# Patient Record
Sex: Male | Born: 1955 | Race: White | Hispanic: No | Marital: Married | Smoking: Never smoker
Health system: Southern US, Community
[De-identification: ages and names within clinical notes are randomized; demographics above are authoritative.]

## PROBLEM LIST (undated history)

## (undated) DIAGNOSIS — S14105A Unspecified injury at C5 level of cervical spinal cord, initial encounter: Secondary | ICD-10-CM

## (undated) DIAGNOSIS — I10 Essential (primary) hypertension: Secondary | ICD-10-CM

## (undated) DIAGNOSIS — G8250 Quadriplegia, unspecified: Secondary | ICD-10-CM

## (undated) DIAGNOSIS — G904 Autonomic dysreflexia: Secondary | ICD-10-CM

## (undated) HISTORY — PX: SPINE SURGERY: SHX786

## (undated) HISTORY — PX: COLONOSCOPY: SHX174

## (undated) HISTORY — DX: Unspecified injury at C5 level of cervical spinal cord, initial encounter: S14.105A

## (undated) HISTORY — DX: Autonomic dysreflexia: G90.4

## (undated) HISTORY — DX: Essential (primary) hypertension: I10

## (undated) HISTORY — PX: ANKLE SURGERY: SHX546

## (undated) HISTORY — PX: BACK SURGERY: SHX140

## (undated) HISTORY — DX: Quadriplegia, unspecified: G82.50

---

## 1979-04-03 DIAGNOSIS — IMO0002 Reserved for concepts with insufficient information to code with codable children: Secondary | ICD-10-CM

## 1979-04-03 HISTORY — DX: Reserved for concepts with insufficient information to code with codable children: IMO0002

## 1999-04-27 ENCOUNTER — Ambulatory Visit (HOSPITAL_COMMUNITY): Admission: RE | Admit: 1999-04-27 | Discharge: 1999-04-27 | Payer: Self-pay | Admitting: Neurological Surgery

## 1999-04-27 ENCOUNTER — Encounter: Payer: Self-pay | Admitting: Neurological Surgery

## 1999-10-19 ENCOUNTER — Encounter: Payer: Self-pay | Admitting: Neurological Surgery

## 1999-10-23 ENCOUNTER — Inpatient Hospital Stay (HOSPITAL_COMMUNITY): Admission: RE | Admit: 1999-10-23 | Discharge: 1999-10-27 | Payer: Self-pay | Admitting: Neurological Surgery

## 1999-12-08 ENCOUNTER — Encounter: Admission: RE | Admit: 1999-12-08 | Discharge: 1999-12-08 | Payer: Self-pay | Admitting: Neurological Surgery

## 1999-12-08 ENCOUNTER — Encounter: Payer: Self-pay | Admitting: Neurological Surgery

## 2000-03-13 ENCOUNTER — Ambulatory Visit (HOSPITAL_COMMUNITY): Admission: RE | Admit: 2000-03-13 | Discharge: 2000-03-13 | Payer: Self-pay | Admitting: Neurological Surgery

## 2000-03-13 ENCOUNTER — Encounter: Payer: Self-pay | Admitting: Neurological Surgery

## 2002-07-22 ENCOUNTER — Encounter
Admission: RE | Admit: 2002-07-22 | Discharge: 2002-10-20 | Payer: Self-pay | Admitting: Physical Medicine & Rehabilitation

## 2003-01-18 ENCOUNTER — Encounter
Admission: RE | Admit: 2003-01-18 | Discharge: 2003-04-18 | Payer: Self-pay | Admitting: Physical Medicine & Rehabilitation

## 2003-05-01 ENCOUNTER — Ambulatory Visit (HOSPITAL_COMMUNITY): Admission: RE | Admit: 2003-05-01 | Discharge: 2003-05-01 | Payer: Self-pay | Admitting: Neurological Surgery

## 2004-01-26 ENCOUNTER — Ambulatory Visit: Payer: Self-pay | Admitting: Anesthesiology

## 2004-04-04 ENCOUNTER — Ambulatory Visit: Payer: Self-pay | Admitting: Rheumatology

## 2004-12-28 ENCOUNTER — Ambulatory Visit: Payer: Self-pay | Admitting: Urology

## 2005-03-08 ENCOUNTER — Ambulatory Visit: Payer: Self-pay | Admitting: Surgery

## 2006-02-27 ENCOUNTER — Emergency Department: Payer: Self-pay

## 2006-03-11 ENCOUNTER — Ambulatory Visit: Payer: Self-pay | Admitting: Urology

## 2006-03-28 ENCOUNTER — Ambulatory Visit: Payer: Self-pay | Admitting: Rheumatology

## 2006-04-02 HISTORY — PX: HEMORRHOID SURGERY: SHX153

## 2007-03-13 ENCOUNTER — Ambulatory Visit: Payer: Self-pay | Admitting: Neurological Surgery

## 2008-04-05 ENCOUNTER — Ambulatory Visit (HOSPITAL_COMMUNITY): Admission: RE | Admit: 2008-04-05 | Discharge: 2008-04-05 | Payer: Self-pay | Admitting: Neurological Surgery

## 2008-09-17 ENCOUNTER — Ambulatory Visit: Payer: Self-pay | Admitting: Urology

## 2009-08-16 ENCOUNTER — Ambulatory Visit: Payer: Self-pay | Admitting: Urology

## 2010-04-23 ENCOUNTER — Encounter: Payer: Self-pay | Admitting: Neurological Surgery

## 2010-06-02 ENCOUNTER — Ambulatory Visit: Payer: Self-pay | Admitting: Unknown Physician Specialty

## 2010-08-18 NOTE — Discharge Summary (Signed)
[place]. [facility]  Patient:    Jesse Whitney, Jesse Whitney                    MRN: [id number] Adm. Date:  [id number] Disc. Date: [id number] Attending:  Jonne Ply                           Discharge Summary  ADMISSION DIAGNOSIS:  Cervical spinal cord injury with postoperative thoracic searings and severe spasms.  DISCHARGE DIAGNOSIS:  Cervical spinal cord injury with postoperative thoracic searings and severe spasms.  OPERATION PERFORMED:  Thoracic laminectomy and laser syringoscopy DD:  10/27/99 TD:  10/30/99 Job: [id number]

## 2010-08-18 NOTE — Discharge Summary (Signed)
[place]. [facility]  Patient:    Jesse Whitney, Jesse Whitney                    MRN: [id number] Adm. Date:  [id number] Disc. Date: [id number] Attending:  Jonne Ply                           Discharge Summary  ADMISSION DIAGNOSES:  Cervical spinal cord injury with postoperative thoracic ______  and severe spasms.  DISCHARGE DIAGNOSES:  Cervical spinal cord injury with postoperative thoracic ______  and severe spasms.  OPERATION:  Thoracic laminectomy and laser syringostomy on October 23, 1999.  CONDITION ON DISCHARGE:  Improving.  HOSPITAL COURSE:  The patient is a 55 year old individual who had a spinal cord injury some 14 years ago.  The patient has been paraplegic since that time.  He has developed problems with increasing spasms in the lower extremities such that he has become immobilized despite the use of massive doses of medication.  Her was advised regarding syringostomy and was admitted to the hospital where he underwent this procedure.  Postoperatively for the first three days, the patient had significant problems with fever and neck pain.  This was well controlled with the use of anti-inflammatories and hydrocodone.  He is discharged home at this time with a prescription for hydrocodone #90.  He will be seen in the office in approximately two weeks time for further followup.  He is also on Valium at this time, but he has not had a significant problem with spasms as he had preoperatively.  His incision is clean and dry, and he has gone back to his baseline level of functioning. DD:  10/27/99 TD:  10/30/99 Job: [id number]
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
## 2010-08-18 NOTE — Op Note (Signed)
[place]. [facility]  Patient:    Jesse Whitney, Jesse Whitney                    MRN: [id number] Proc. Date: 10/23/99 Adm. Date:  [id number] Attending:  Jonne Ply                           Operative Report  PREOPERATIVE DIAGNOSIS:  Thoracic syringomyelia with severe spasticity in lower extremities, paraplegia secondary to C7 trauma fracture.  POSTOPERATIVE DIAGNOSIS: Thoracic syringomyelia with severe spasticity in lower extremities, paraplegia secondary to C7 trauma fracture.  PROCEDURE:  Thoracic laminectomy T2 to T3 with laser syringostomy and microscope with microdissection technique.  SURGEON:  Stefani Dama, M.D.  ASSISTANT:  Tanya Nones. Jeral Fruit, M.D.  ANESTHESIA:  General endotracheal.  INDICATIONS:  The patient is a 55 year old individual, who has severe spasticity in his lower extremities with the presence of a large thoracic syrinx.  PROCEDURE:  The patient was brought to the operating room supine on a stretcher.  After smooth induction of general endotracheal anesthesia, he was turned prone, back was shaved, prepped with Duraprep and draped in a sterile fashion. An elliptical incision was made around the inferior portion of his scar and this was excised in the upper thoracic region and the incision was continued downward below the edge of his scar.  Dissection was then managed in a subperiosteal fashion, exposing the spinous process using the lamina of T2 and T3.  Then using a B5 bur and the Midas Rex, a thoracic laminectomy was performed in an en bloc fashion removing the spinous process and lamina of T2 and then T3.  The underlying dura was identified, hemostasis from the soft tissues was obtained with the bipolar cautery.  As the dissection was continued, hemostasis from the soft tissues and redundant, thickened yellow ligament was obtained and these areas were resected so as to expose the area of the lamina.  The microscope was then  draped and brought into the field and then using 6-0 Prolene suture as a tenting suture, the dura was opened, underlying cord was noted to be abutted right against the inner layer of the dura.  Minimal amounts of spinal fluid were encountered in this area. Retraction sutures were then placed on either side and tied to the muscle so as to expose the cord.  Then, using a microdissection technique, the arachnoid was lifted gently in the mid portion and divided.  Again, minimal amount of spinal fluid was encountered in this area.  The laser was then brought into the field and set at repeating pulse sequence of 5 watts with 0.15 second pulse width repeating every 0.2 seconds.  Using this technique, then a laser syringostomy was performed in the mid portion of the thoracic spinal canal and a large gush of spinal fluid was obtained when the initial puncture was performed.  The laser syringostomy was opened for a distance of approximately 4 mm.  Then, blunt dissection was also performed to make sure that the opening was adequate.  The arachnoid was further dissected above this area.  In the end, the cord was well-decompressed.  The dura was then closed under the microscope using a 6-0 Prolene microtechnique.  The dural closure was noted to be intact.  Hemostasis from the surrounding soft tissues, which once decompressed had bled more profusely, was obtained with the bipolar cautery and some pledgets of Gelfoam and thrombin, which were later  removed.  The muscle layer was then closed with #1 Vicryl and #1 PDS in an interrupted fashion, 2-0 Vicryl was used in the subcutaneous and subcuticular tissues, 3-0 Vicryl was used to close the skin subcuticularly, very tightly.  The patient tolerated the procedure well and was returned to recovery room in stable condition.  Blood loss for the procedure was estimated at 300 cc. DD:  10/23/99 TD:  10/23/99 Job: [id number]

## 2011-05-30 ENCOUNTER — Ambulatory Visit: Payer: Self-pay | Admitting: Urology

## 2011-06-04 ENCOUNTER — Ambulatory Visit: Payer: Self-pay | Admitting: Urology

## 2011-10-09 ENCOUNTER — Ambulatory Visit: Payer: Self-pay | Admitting: Rheumatology

## 2013-08-17 ENCOUNTER — Ambulatory Visit: Payer: Self-pay | Admitting: Urology

## 2014-07-25 NOTE — Op Note (Signed)
PATIENT NAME:  Jesse Whitney, [name] MR#:  [id number] DATE OF BIRTH:  Apr 21, 1955  DATE OF PROCEDURE:  06/04/2011  PREOPERATIVE DIAGNOSIS: Urethral stricture disease.   POSTOPERATIVE DIAGNOSIS: Urethral stricture disease.   PROCEDURE: Cysto with visual internal urethrotomy using the Holmium laser.   SURGEON: Suszanne ConnersMichael R. Evelene CroonWolff, MD  ANESTHETIST: Dr. Noralyn Pickarroll  ANESTHESIA: General.   INDICATIONS: See the dictated history and physical. After informed consent, the patient requested the above procedure.   OPERATIVE SUMMARY: After adequate general anesthesia had been obtained, patient was placed into dorsal lithotomy position and the perineum was prepped and draped in the usual fashion. The 17 French cystoscope was coupled with the camera and then visually advanced into the urethra. The scope could be advanced only up to the bulbar urethra at which point stricture was identified. A 0.035 guidewire was then advanced through the stricture and curled into the bladder. The 550 micron Holmium laser fiber was then introduced through the scope and the  stricture incised at the 12:00 position eventually allowing entry into the bladder. Bladder was moderately trabeculated. No bladder lesions were identified. Both ureteral orifices were identified and had clear efflux. At this point the cystoscope and guidewire were removed. A 20 French silicone catheter was placed. Procedure was then terminated. A B and O suppository was placed. Patient was then transferred to the recovery room in stable condition.   ____________________________ Suszanne ConnersMichael R. Evelene CroonWolff, MD mrw:cms D: 06/04/2011 11:09:11 ET T: 06/04/2011 11:24:34 ET JOB#: [id number] cc: Suszanne ConnersMichael R. Evelene CroonWolff, MD, <Dictator> Orson ApeMICHAEL R Karista Aispuro MD ELECTRONICALLY SIGNED 06/05/2011 7:36

## 2014-07-25 NOTE — H&P (Signed)
PATIENT NAME:  Jesse Whitney, Jesse Whitney MR#:  [id number] DATE OF BIRTH:  [birth year]  DATE OF ADMISSION:  03[name]/07/2011  CHIEF COMPLAINT: Difficulty inserting catheter.   HISTORY OF PRESENT ILLNESS: Jesse Whitney is a 59 year old white male, a C6 incomplete quadriplegic, who performs intermittent self-catheterization. For the past few months, he has had difficulty performing this as the catheter meets resistance. He also has developed symptoms consistent with dysreflexia including headaches and diaphoresis. He comes in now for cysto with visual internal urethrotomy using the holmium laser. He does have a history of stricture disease and underwent urethrotomy back in 1994.   ALLERGIES: IV contrast.   CURRENT MEDICATIONS: Diazepam, fentanyl patch, omeprazole, and Toviaz.  PAST SURGICAL HISTORY:  1. C-spine laminectomy in 1981. 2. Internal urethrotomy 1988 and in 1994.   SOCIAL HISTORY: He is married. He denies tobacco use. He drinks beer socially.   FAMILY HISTORY: Noncontributory.   PAST AND CURRENT MEDICAL CONDITIONS:  1. Neurogenic bladder. 2. C6 incomplete quadriplegia. 3. Probable urethral stricture disease.  4. Chronic pain. 5. Hypogonadism.   REVIEW OF SYSTEMS: The patient denied chest pain, shortness of breath, diabetes, stroke, or heart disease.     PHYSICAL EXAMINATION:   GENERAL: Well-nourished white male in no distress.   HEENT: Pupils were clear. Extraocular movements were intact.   NECK: Supple. No palpable cervical adenopathy.   LUNGS: Clear to auscultation.   HEART: Regular rate and rhythm without audible murmurs or gallops.  ABDOMEN: Soft and nontender abdomen.   GENITOURINARY: Circumcised. Testes atrophic, 10 mL in size each.   RECTAL: Poor anal sphincter tone. Prostate gland 20 grams, smooth and nontender.   NEUROMUSCULAR: Exam was consistent with incomplete quadriplegia.   IMPRESSION:  1. Probable urethral stricture disease.  2. Neurogenic  bladder. 3. Quadriplegia.   PLAN: Cystoscopy with internal urethrotomy using holmium laser. ____________________________ Suszanne ConnersMichael R. Evelene CroonWolff, MD mrw:slb D: 05/30/2011 13:37:21 ET     T: 05/30/2011 13:46:45 ET        JOB#: [id number] cc: Suszanne ConnersMichael R. Evelene CroonWolff, MD, <Dictator> Jesse ApeMICHAEL R Antjuan Rothe MD ELECTRONICALLY SIGNED 05/31/2011 14:25

## 2014-11-03 ENCOUNTER — Ambulatory Visit: Payer: PPO | Attending: Anesthesiology | Admitting: Anesthesiology

## 2014-11-03 ENCOUNTER — Encounter: Payer: Self-pay | Admitting: Anesthesiology

## 2014-11-03 VITALS — BP 117/78 | HR 92 | Temp 98.4°F | Resp 16 | Ht 67.0 in | Wt 160.0 lb

## 2014-11-03 DIAGNOSIS — G8929 Other chronic pain: Secondary | ICD-10-CM

## 2014-11-03 DIAGNOSIS — M25511 Pain in right shoulder: Secondary | ICD-10-CM | POA: Diagnosis not present

## 2014-11-03 DIAGNOSIS — M25512 Pain in left shoulder: Secondary | ICD-10-CM | POA: Insufficient documentation

## 2014-11-03 DIAGNOSIS — G904 Autonomic dysreflexia: Secondary | ICD-10-CM | POA: Diagnosis not present

## 2014-11-03 DIAGNOSIS — M542 Cervicalgia: Secondary | ICD-10-CM | POA: Diagnosis present

## 2014-11-03 DIAGNOSIS — S14105A Unspecified injury at C5 level of cervical spinal cord, initial encounter: Secondary | ICD-10-CM

## 2014-11-03 DIAGNOSIS — G8253 Quadriplegia, C5-C7 complete: Secondary | ICD-10-CM | POA: Insufficient documentation

## 2014-11-03 DIAGNOSIS — M7501 Adhesive capsulitis of right shoulder: Secondary | ICD-10-CM

## 2014-11-03 DIAGNOSIS — S14109A Unspecified injury at unspecified level of cervical spinal cord, initial encounter: Secondary | ICD-10-CM

## 2014-11-03 DIAGNOSIS — M7502 Adhesive capsulitis of left shoulder: Secondary | ICD-10-CM

## 2014-11-03 NOTE — Progress Notes (Signed)
Safety precautions to be maintained throughout the outpatient stay will include: orient to surroundings, keep bed in low position, maintain call bell within reach at all times, provide assistance with transfer out of bed and ambulation.  

## 2014-11-03 NOTE — Progress Notes (Signed)
   Subjective:    Patient ID: Jesse Whitney, male    DOB: [birth year], 59 y.o.   MRN: [id number]  HPI    Review of Systems     Objective:   Physical Exam        Assessment & Plan:

## 2014-11-03 NOTE — Patient Instructions (Signed)
Do not eat or drink 2 hours before your procedure. 
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
## 2014-11-05 ENCOUNTER — Ambulatory Visit: Payer: PPO | Attending: Anesthesiology | Admitting: Anesthesiology

## 2014-11-05 ENCOUNTER — Encounter: Payer: Self-pay | Admitting: Anesthesiology

## 2014-11-05 VITALS — BP 135/105 | HR 82 | Temp 98.7°F | Resp 18 | Ht 68.0 in | Wt 160.0 lb

## 2014-11-05 DIAGNOSIS — M19011 Primary osteoarthritis, right shoulder: Secondary | ICD-10-CM

## 2014-11-05 DIAGNOSIS — M25511 Pain in right shoulder: Secondary | ICD-10-CM | POA: Diagnosis present

## 2014-11-05 DIAGNOSIS — M25512 Pain in left shoulder: Secondary | ICD-10-CM | POA: Diagnosis not present

## 2014-11-05 DIAGNOSIS — M7502 Adhesive capsulitis of left shoulder: Secondary | ICD-10-CM | POA: Insufficient documentation

## 2014-11-05 DIAGNOSIS — M542 Cervicalgia: Secondary | ICD-10-CM | POA: Insufficient documentation

## 2014-11-05 DIAGNOSIS — M7501 Adhesive capsulitis of right shoulder: Secondary | ICD-10-CM

## 2014-11-05 DIAGNOSIS — S14105A Unspecified injury at C5 level of cervical spinal cord, initial encounter: Secondary | ICD-10-CM

## 2014-11-05 DIAGNOSIS — G8250 Quadriplegia, unspecified: Secondary | ICD-10-CM

## 2014-11-05 DIAGNOSIS — G8929 Other chronic pain: Secondary | ICD-10-CM | POA: Diagnosis present

## 2014-11-05 MED ORDER — BUPIVACAINE HCL (PF) 0.5 % IJ SOLN
INTRAMUSCULAR | Status: AC
  Administered 2014-11-05: 11:00:00
  Filled 2014-11-05: qty 30

## 2014-11-05 MED ORDER — TRIAMCINOLONE ACETONIDE 40 MG/ML IJ SUSP
INTRAMUSCULAR | Status: AC
  Administered 2014-11-05: 11:00:00
  Filled 2014-11-05: qty 1

## 2014-11-05 NOTE — Patient Instructions (Signed)

## 2014-11-05 NOTE — Progress Notes (Signed)
Safety precautions to be maintained throughout the outpatient stay will include: orient to surroundings, keep bed in low position, maintain call bell within reach at all times, provide assistance with transfer out of bed and ambulation.  

## 2014-11-05 NOTE — Progress Notes (Signed)
   Subjective:    Patient ID: Jesse Whitney, male    DOB: 06/29/1955, 59 y.o.   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 3 chronic neck pain 4 quadriplegia 5 transection of the spinal cord at the cervical level                                                 Postoperative diagnosis  Same   Procedure planned  Left suprascapular nerve block   Surgeon  Tod Persia M.D   Anesthesia  None   Informed consent was obtained and the patient appeared to accept and understand the benefits and risks of this procedure  Timeout was performed in conjunction with the nursing staff   Description of procedure  The patient was placed in the nurse in the sitting position in his wheelchair  The left posterior shoulder area was prepped with alcohol sponges. 0.1 fingerbreadth above the midpoint of the spine of the left scapula was determined.  Then a 22-gauge 1-1/2 inch needle was inserted at that point in the direction of the left suprascapular fossa.  As the needle was advanced to clear paresthesia was elicited going into the left shoulder tip.  Aspirations were negative for blurred CSF and other body fluids.  Then 10 cc of 0.5 bupivacaine and 40 mg of Kenalog were injected into the region of the left suprascapular nerve.  Aspirations were done prior to the injection and it were all negative.  The needle was removed and adequate hemostasis was obtained.  She was applied to the point of entry.  A Band-Aid was applied over the site of the injection.  Vital signs were stable and patient tolerated the procedure quite well.  Patient was observed in the recovery room for approximately 30 minutes and discharged home with  his wife.  Follow-up with this patient in the next 2 weeks    Tod Persia M.D.

## 2014-11-08 ENCOUNTER — Telehealth: Payer: Self-pay | Admitting: *Deleted

## 2014-11-08 NOTE — Telephone Encounter (Signed)
Called pt stated "" I'm  doing really great with my arm after the shot."

## 2014-11-17 ENCOUNTER — Ambulatory Visit: Payer: PPO | Admitting: Anesthesiology

## 2014-11-22 ENCOUNTER — Encounter: Payer: Self-pay | Admitting: Anesthesiology

## 2014-11-22 ENCOUNTER — Ambulatory Visit: Payer: PPO | Attending: Anesthesiology | Admitting: Anesthesiology

## 2014-11-22 VITALS — BP 137/92 | HR 74 | Temp 98.4°F | Resp 14 | Ht 67.0 in | Wt 160.0 lb

## 2014-11-22 DIAGNOSIS — M19019 Primary osteoarthritis, unspecified shoulder: Secondary | ICD-10-CM

## 2014-11-22 DIAGNOSIS — S14105D Unspecified injury at C5 level of cervical spinal cord, subsequent encounter: Secondary | ICD-10-CM

## 2014-11-22 DIAGNOSIS — M25511 Pain in right shoulder: Secondary | ICD-10-CM | POA: Insufficient documentation

## 2014-11-22 DIAGNOSIS — S14109D Unspecified injury at unspecified level of cervical spinal cord, subsequent encounter: Secondary | ICD-10-CM

## 2014-11-22 DIAGNOSIS — M542 Cervicalgia: Secondary | ICD-10-CM | POA: Diagnosis not present

## 2014-11-22 DIAGNOSIS — M25512 Pain in left shoulder: Secondary | ICD-10-CM | POA: Diagnosis present

## 2014-11-22 DIAGNOSIS — G8250 Quadriplegia, unspecified: Secondary | ICD-10-CM

## 2014-11-22 DIAGNOSIS — G8253 Quadriplegia, C5-C7 complete: Secondary | ICD-10-CM | POA: Insufficient documentation

## 2014-11-22 DIAGNOSIS — G904 Autonomic dysreflexia: Secondary | ICD-10-CM

## 2014-11-22 DIAGNOSIS — G8929 Other chronic pain: Secondary | ICD-10-CM | POA: Diagnosis not present
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
## 2014-11-22 NOTE — Progress Notes (Signed)
Safety precautions to be maintained throughout the outpatient stay will include: orient to surroundings, keep bed in low position, maintain call bell within reach at all times, provide assistance with transfer out of bed and ambulation.  

## 2014-11-22 NOTE — Patient Instructions (Signed)
Bring someone who can drive you home. 

## 2014-12-10 ENCOUNTER — Ambulatory Visit: Payer: PPO | Admitting: Anesthesiology

## 2015-02-10 ENCOUNTER — Ambulatory Visit: Payer: PPO | Attending: Pain Medicine | Admitting: Pain Medicine

## 2015-02-10 ENCOUNTER — Encounter: Payer: Self-pay | Admitting: Pain Medicine

## 2015-02-10 VITALS — BP 112/76 | HR 69 | Temp 98.1°F | Resp 18 | Ht 68.0 in | Wt 150.0 lb

## 2015-02-10 DIAGNOSIS — M542 Cervicalgia: Secondary | ICD-10-CM | POA: Diagnosis not present

## 2015-02-10 DIAGNOSIS — M7500 Adhesive capsulitis of unspecified shoulder: Secondary | ICD-10-CM

## 2015-02-10 DIAGNOSIS — G8250 Quadriplegia, unspecified: Secondary | ICD-10-CM | POA: Diagnosis not present

## 2015-02-10 DIAGNOSIS — S14109D Unspecified injury at unspecified level of cervical spinal cord, subsequent encounter: Secondary | ICD-10-CM

## 2015-02-10 DIAGNOSIS — S14109A Unspecified injury at unspecified level of cervical spinal cord, initial encounter: Secondary | ICD-10-CM

## 2015-02-10 DIAGNOSIS — M25511 Pain in right shoulder: Secondary | ICD-10-CM | POA: Diagnosis present

## 2015-02-10 DIAGNOSIS — M25512 Pain in left shoulder: Secondary | ICD-10-CM

## 2015-02-10 DIAGNOSIS — M5481 Occipital neuralgia: Secondary | ICD-10-CM | POA: Insufficient documentation

## 2015-02-10 DIAGNOSIS — M19011 Primary osteoarthritis, right shoulder: Secondary | ICD-10-CM

## 2015-02-10 DIAGNOSIS — M7502 Adhesive capsulitis of left shoulder: Secondary | ICD-10-CM

## 2015-02-10 DIAGNOSIS — S14105D Unspecified injury at C5 level of cervical spinal cord, subsequent encounter: Secondary | ICD-10-CM

## 2015-02-10 DIAGNOSIS — M19012 Primary osteoarthritis, left shoulder: Secondary | ICD-10-CM | POA: Diagnosis not present

## 2015-02-10 DIAGNOSIS — M19112 Post-traumatic osteoarthritis, left shoulder: Secondary | ICD-10-CM

## 2015-02-10 DIAGNOSIS — G904 Autonomic dysreflexia: Secondary | ICD-10-CM

## 2015-02-10 DIAGNOSIS — M19111 Post-traumatic osteoarthritis, right shoulder: Secondary | ICD-10-CM

## 2015-02-10 DIAGNOSIS — G8929 Other chronic pain: Secondary | ICD-10-CM

## 2015-02-10 DIAGNOSIS — R532 Functional quadriplegia: Secondary | ICD-10-CM

## 2015-02-10 DIAGNOSIS — M19019 Primary osteoarthritis, unspecified shoulder: Secondary | ICD-10-CM | POA: Insufficient documentation

## 2015-02-10 DIAGNOSIS — S14105A Unspecified injury at C5 level of cervical spinal cord, initial encounter: Secondary | ICD-10-CM

## 2015-02-10 DIAGNOSIS — M7501 Adhesive capsulitis of right shoulder: Secondary | ICD-10-CM

## 2015-02-10 NOTE — Patient Instructions (Addendum)
PLAN   Continue present medication  Shoulder injection to be performed at time of return appointment  F/U PCP Dr. Anna Genreonroy for evaliation of  BP and general medical  condition  F/U surgical evaluation. May consider pending follow-up evaluations  F/U neurological evaluation. May consider pending follow-up evaluations  May consider radiofrequency rhizolysis or intraspinal procedures pending response to present treatment and F/U evaluation   Patient to call Pain Management Center should patient have concerns prior to scheduled return appointment. Do not eat or drink 2 hours before your appt

## 2015-02-10 NOTE — Progress Notes (Signed)
Safety precautions to be maintained throughout the outpatient stay will include: orient to surroundings, keep bed in low position, maintain call bell within reach at all times, provide assistance with transfer out of bed and ambulation.  
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
## 2015-02-18 ENCOUNTER — Other Ambulatory Visit: Payer: Self-pay | Admitting: Pain Medicine

## 2015-02-21 ENCOUNTER — Ambulatory Visit: Payer: PPO | Attending: Pain Medicine | Admitting: Pain Medicine

## 2015-02-21 ENCOUNTER — Encounter: Payer: Self-pay | Admitting: Pain Medicine

## 2015-02-21 VITALS — BP 125/86 | HR 73 | Temp 97.8°F | Resp 16 | Ht 68.0 in | Wt 165.0 lb

## 2015-02-21 DIAGNOSIS — G8929 Other chronic pain: Secondary | ICD-10-CM

## 2015-02-21 DIAGNOSIS — M7500 Adhesive capsulitis of unspecified shoulder: Secondary | ICD-10-CM

## 2015-02-21 DIAGNOSIS — G8250 Quadriplegia, unspecified: Secondary | ICD-10-CM | POA: Diagnosis not present

## 2015-02-21 DIAGNOSIS — G904 Autonomic dysreflexia: Secondary | ICD-10-CM

## 2015-02-21 DIAGNOSIS — M19112 Post-traumatic osteoarthritis, left shoulder: Secondary | ICD-10-CM

## 2015-02-21 DIAGNOSIS — S14109D Unspecified injury at unspecified level of cervical spinal cord, subsequent encounter: Secondary | ICD-10-CM

## 2015-02-21 DIAGNOSIS — M19019 Primary osteoarthritis, unspecified shoulder: Secondary | ICD-10-CM

## 2015-02-21 DIAGNOSIS — S14105D Unspecified injury at C5 level of cervical spinal cord, subsequent encounter: Secondary | ICD-10-CM

## 2015-02-21 DIAGNOSIS — M25512 Pain in left shoulder: Secondary | ICD-10-CM | POA: Insufficient documentation

## 2015-02-21 DIAGNOSIS — M7501 Adhesive capsulitis of right shoulder: Secondary | ICD-10-CM

## 2015-02-21 DIAGNOSIS — M19111 Post-traumatic osteoarthritis, right shoulder: Secondary | ICD-10-CM

## 2015-02-21 DIAGNOSIS — M25511 Pain in right shoulder: Secondary | ICD-10-CM | POA: Insufficient documentation

## 2015-02-21 DIAGNOSIS — S14109A Unspecified injury at unspecified level of cervical spinal cord, initial encounter: Secondary | ICD-10-CM

## 2015-02-21 DIAGNOSIS — M542 Cervicalgia: Secondary | ICD-10-CM

## 2015-02-21 DIAGNOSIS — M7502 Adhesive capsulitis of left shoulder: Secondary | ICD-10-CM

## 2015-02-21 DIAGNOSIS — M19011 Primary osteoarthritis, right shoulder: Secondary | ICD-10-CM

## 2015-02-21 DIAGNOSIS — R532 Functional quadriplegia: Secondary | ICD-10-CM

## 2015-02-21 DIAGNOSIS — G8253 Quadriplegia, C5-C7 complete: Secondary | ICD-10-CM

## 2015-02-21 DIAGNOSIS — S14105A Unspecified injury at C5 level of cervical spinal cord, initial encounter: Secondary | ICD-10-CM

## 2015-02-21 MED ORDER — BUPIVACAINE HCL (PF) 0.25 % IJ SOLN: 30.0000 mL | Freq: Once | INTRAMUSCULAR | Status: DC

## 2015-02-21 MED ORDER — SODIUM CHLORIDE 0.9 % IJ SOLN: 20.0000 mL | Freq: Once | INTRAMUSCULAR | Status: DC

## 2015-02-21 MED ORDER — SODIUM CHLORIDE 0.9 % IJ SOLN
INTRAMUSCULAR | Status: AC
  Administered 2015-02-21: 15:00:00
  Filled 2015-02-21: qty 10

## 2015-02-21 MED ORDER — ORPHENADRINE CITRATE 30 MG/ML IJ SOLN: 60.0000 mg | Freq: Once | INTRAMUSCULAR | Status: DC

## 2015-02-21 MED ORDER — TRIAMCINOLONE ACETONIDE 40 MG/ML IJ SUSP
INTRAMUSCULAR | Status: AC
  Administered 2015-02-21: 15:00:00
  Filled 2015-02-21: qty 1

## 2015-02-21 MED ORDER — TRIAMCINOLONE ACETONIDE 40 MG/ML IJ SUSP: 40.0000 mg | Freq: Once | INTRAMUSCULAR | Status: DC

## 2015-02-21 NOTE — Patient Instructions (Addendum)
PLAN   Continue present medication  F/U PCP Dr. Anna Genreonroy for evaliation of  BP and general medical  condition  F/U surgical evaluation. May consider pending follow-up evaluations  F/U neurological evaluation. May consider pending follow-up evaluations  May consider radiofrequency rhizolysis or intraspinal procedures pending response to present treatment and F/U evaluation   Patient to call Pain Management Center should patient have concerns prior to scheduled return appointment.Pain Management Discharge Instructions  General Discharge Instructions :  If you need to reach your doctor call: Monday-Friday 8:00 am - 4:00 pm at [phone] or toll free [phone].  After clinic hours [phone] to have operator reach doctor.  Bring all of your medication bottles to all your appointments in the pain clinic.  To cancel or reschedule your appointment with Pain Management please remember to call 24 hours in advance to avoid a fee.  Refer to the educational materials which you have been given on: General Risks, I had my Procedure. Discharge Instructions, Post Sedation.  Post Procedure Instructions:  The drugs you were given will stay in your system until tomorrow, so for the next 24 hours you should not drive, make any legal decisions or drink any alcoholic beverages.  You may eat anything you prefer, but it is better to start with liquids then soups and crackers, and gradually work up to solid foods.  Please notify your doctor immediately if you have any unusual bleeding, trouble breathing or pain that is not related to your normal pain.  Depending on the type of procedure that was done, some parts of your body may feel week and/or numb.  This usually clears up by tonight or the next day.  Walk with the use of an assistive device or accompanied by an adult for the 24 hours.  You may use ice on the affected area for the first 24 hours.  Put ice in a Ziploc bag and cover with a towel  and place against area 15 minutes on 15 minutes off.  You may switch to heat after 24 hours.
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
## 2015-02-22 ENCOUNTER — Telehealth: Payer: Self-pay | Admitting: *Deleted

## 2015-02-22 NOTE — Telephone Encounter (Signed)
Left voicemail re; procedure on yesterday, to call with any problems or concerns.

## 2015-03-22 ENCOUNTER — Ambulatory Visit: Payer: PPO | Admitting: Pain Medicine

## 2015-04-05 ENCOUNTER — Telehealth: Payer: Self-pay | Admitting: Pain Medicine

## 2015-04-05 ENCOUNTER — Ambulatory Visit: Payer: PPO | Admitting: Pain Medicine

## 2015-04-05 NOTE — Telephone Encounter (Signed)
Kathy Thank you 

## 2015-04-05 NOTE — Telephone Encounter (Signed)
Patient still have severe UTI unable to come in / shoulder doing ok / still wants to come back when gets straightened out / to see about opposite shoulder/ Patient left vmail  04-04-15 at 5:05

## 2015-04-12 DIAGNOSIS — R339 Retention of urine, unspecified: Secondary | ICD-10-CM | POA: Diagnosis not present

## 2015-04-18 DIAGNOSIS — E291 Testicular hypofunction: Secondary | ICD-10-CM | POA: Diagnosis not present

## 2015-04-18 DIAGNOSIS — G8254 Quadriplegia, C5-C7 incomplete: Secondary | ICD-10-CM | POA: Diagnosis not present

## 2015-04-27 DIAGNOSIS — N5201 Erectile dysfunction due to arterial insufficiency: Secondary | ICD-10-CM | POA: Diagnosis not present

## 2015-04-27 DIAGNOSIS — N390 Urinary tract infection, site not specified: Secondary | ICD-10-CM | POA: Diagnosis not present

## 2015-04-27 DIAGNOSIS — E291 Testicular hypofunction: Secondary | ICD-10-CM | POA: Diagnosis not present

## 2015-04-27 DIAGNOSIS — N310 Uninhibited neuropathic bladder, not elsewhere classified: Secondary | ICD-10-CM | POA: Diagnosis not present

## 2015-05-04 DIAGNOSIS — M7582 Other shoulder lesions, left shoulder: Secondary | ICD-10-CM | POA: Diagnosis not present

## 2015-05-21 DIAGNOSIS — R339 Retention of urine, unspecified: Secondary | ICD-10-CM | POA: Diagnosis not present

## 2015-05-21 DIAGNOSIS — S14109A Unspecified injury at unspecified level of cervical spinal cord, initial encounter: Secondary | ICD-10-CM | POA: Diagnosis not present

## 2015-05-25 DIAGNOSIS — G8254 Quadriplegia, C5-C7 incomplete: Secondary | ICD-10-CM | POA: Diagnosis not present

## 2015-05-25 DIAGNOSIS — E291 Testicular hypofunction: Secondary | ICD-10-CM | POA: Diagnosis not present

## 2015-05-25 DIAGNOSIS — Z6824 Body mass index (BMI) 24.0-24.9, adult: Secondary | ICD-10-CM | POA: Diagnosis not present

## 2015-05-25 DIAGNOSIS — Q279 Congenital malformation of peripheral vascular system, unspecified: Secondary | ICD-10-CM | POA: Diagnosis not present

## 2015-06-22 DIAGNOSIS — E291 Testicular hypofunction: Secondary | ICD-10-CM | POA: Diagnosis not present

## 2015-06-22 DIAGNOSIS — G8254 Quadriplegia, C5-C7 incomplete: Secondary | ICD-10-CM | POA: Diagnosis not present

## 2015-06-22 DIAGNOSIS — N400 Enlarged prostate without lower urinary tract symptoms: Secondary | ICD-10-CM | POA: Diagnosis not present

## 2015-06-22 DIAGNOSIS — M545 Low back pain: Secondary | ICD-10-CM | POA: Diagnosis not present

## 2015-06-30 DIAGNOSIS — G8253 Quadriplegia, C5-C7 complete: Secondary | ICD-10-CM | POA: Diagnosis not present

## 2015-06-30 DIAGNOSIS — Z6825 Body mass index (BMI) 25.0-25.9, adult: Secondary | ICD-10-CM | POA: Diagnosis not present

## 2015-07-06 DIAGNOSIS — R5383 Other fatigue: Secondary | ICD-10-CM | POA: Diagnosis not present

## 2015-07-06 DIAGNOSIS — E291 Testicular hypofunction: Secondary | ICD-10-CM | POA: Diagnosis not present

## 2015-07-06 DIAGNOSIS — G8254 Quadriplegia, C5-C7 incomplete: Secondary | ICD-10-CM | POA: Diagnosis not present

## 2015-07-21 DIAGNOSIS — J019 Acute sinusitis, unspecified: Secondary | ICD-10-CM | POA: Diagnosis not present

## 2015-07-21 DIAGNOSIS — G8254 Quadriplegia, C5-C7 incomplete: Secondary | ICD-10-CM | POA: Diagnosis not present

## 2015-07-21 DIAGNOSIS — Z6824 Body mass index (BMI) 24.0-24.9, adult: Secondary | ICD-10-CM | POA: Diagnosis not present

## 2015-08-01 DIAGNOSIS — N319 Neuromuscular dysfunction of bladder, unspecified: Secondary | ICD-10-CM | POA: Diagnosis not present

## 2015-08-01 DIAGNOSIS — R339 Retention of urine, unspecified: Secondary | ICD-10-CM | POA: Diagnosis not present

## 2015-08-22 DIAGNOSIS — E291 Testicular hypofunction: Secondary | ICD-10-CM | POA: Diagnosis not present

## 2015-08-22 DIAGNOSIS — M2550 Pain in unspecified joint: Secondary | ICD-10-CM | POA: Diagnosis not present

## 2015-08-22 DIAGNOSIS — Z79899 Other long term (current) drug therapy: Secondary | ICD-10-CM | POA: Diagnosis not present

## 2015-08-22 DIAGNOSIS — R6883 Chills (without fever): Secondary | ICD-10-CM | POA: Diagnosis not present

## 2015-08-22 DIAGNOSIS — M25512 Pain in left shoulder: Secondary | ICD-10-CM | POA: Diagnosis not present

## 2015-08-22 DIAGNOSIS — G8254 Quadriplegia, C5-C7 incomplete: Secondary | ICD-10-CM | POA: Diagnosis not present

## 2015-09-05 DIAGNOSIS — R339 Retention of urine, unspecified: Secondary | ICD-10-CM | POA: Diagnosis not present

## 2015-09-05 DIAGNOSIS — N319 Neuromuscular dysfunction of bladder, unspecified: Secondary | ICD-10-CM | POA: Diagnosis not present

## 2015-09-19 DIAGNOSIS — Z6824 Body mass index (BMI) 24.0-24.9, adult: Secondary | ICD-10-CM | POA: Diagnosis not present

## 2015-09-19 DIAGNOSIS — M545 Low back pain: Secondary | ICD-10-CM | POA: Diagnosis not present

## 2015-09-19 DIAGNOSIS — E291 Testicular hypofunction: Secondary | ICD-10-CM | POA: Diagnosis not present

## 2015-09-19 DIAGNOSIS — I10 Essential (primary) hypertension: Secondary | ICD-10-CM | POA: Diagnosis not present

## 2015-09-19 DIAGNOSIS — N400 Enlarged prostate without lower urinary tract symptoms: Secondary | ICD-10-CM | POA: Diagnosis not present

## 2015-09-19 DIAGNOSIS — G8254 Quadriplegia, C5-C7 incomplete: Secondary | ICD-10-CM | POA: Diagnosis not present

## 2015-09-19 DIAGNOSIS — M2550 Pain in unspecified joint: Secondary | ICD-10-CM | POA: Diagnosis not present

## 2015-10-06 DIAGNOSIS — N319 Neuromuscular dysfunction of bladder, unspecified: Secondary | ICD-10-CM | POA: Diagnosis not present

## 2015-10-06 DIAGNOSIS — R339 Retention of urine, unspecified: Secondary | ICD-10-CM | POA: Diagnosis not present

## 2015-10-20 DIAGNOSIS — E291 Testicular hypofunction: Secondary | ICD-10-CM | POA: Diagnosis not present

## 2015-10-20 DIAGNOSIS — G8254 Quadriplegia, C5-C7 incomplete: Secondary | ICD-10-CM | POA: Diagnosis not present

## 2015-10-27 DIAGNOSIS — M7582 Other shoulder lesions, left shoulder: Secondary | ICD-10-CM | POA: Diagnosis not present

## 2015-10-27 DIAGNOSIS — Z6824 Body mass index (BMI) 24.0-24.9, adult: Secondary | ICD-10-CM | POA: Diagnosis not present

## 2015-11-04 DIAGNOSIS — R339 Retention of urine, unspecified: Secondary | ICD-10-CM | POA: Diagnosis not present

## 2015-11-04 DIAGNOSIS — N319 Neuromuscular dysfunction of bladder, unspecified: Secondary | ICD-10-CM | POA: Diagnosis not present

## 2015-11-23 DIAGNOSIS — G8254 Quadriplegia, C5-C7 incomplete: Secondary | ICD-10-CM | POA: Diagnosis not present

## 2015-11-23 DIAGNOSIS — E291 Testicular hypofunction: Secondary | ICD-10-CM | POA: Diagnosis not present

## 2015-11-23 DIAGNOSIS — M791 Myalgia: Secondary | ICD-10-CM | POA: Diagnosis not present

## 2015-11-28 DIAGNOSIS — R339 Retention of urine, unspecified: Secondary | ICD-10-CM | POA: Diagnosis not present

## 2015-11-28 DIAGNOSIS — N319 Neuromuscular dysfunction of bladder, unspecified: Secondary | ICD-10-CM | POA: Diagnosis not present

## 2015-12-14 DIAGNOSIS — G950 Syringomyelia and syringobulbia: Secondary | ICD-10-CM | POA: Diagnosis not present

## 2015-12-19 DIAGNOSIS — I10 Essential (primary) hypertension: Secondary | ICD-10-CM | POA: Diagnosis not present

## 2015-12-19 DIAGNOSIS — K5909 Other constipation: Secondary | ICD-10-CM | POA: Diagnosis not present

## 2015-12-19 DIAGNOSIS — G8254 Quadriplegia, C5-C7 incomplete: Secondary | ICD-10-CM | POA: Diagnosis not present

## 2015-12-19 DIAGNOSIS — Z6824 Body mass index (BMI) 24.0-24.9, adult: Secondary | ICD-10-CM | POA: Diagnosis not present

## 2015-12-19 DIAGNOSIS — Z125 Encounter for screening for malignant neoplasm of prostate: Secondary | ICD-10-CM | POA: Diagnosis not present

## 2015-12-19 DIAGNOSIS — E291 Testicular hypofunction: Secondary | ICD-10-CM | POA: Diagnosis not present

## 2015-12-22 ENCOUNTER — Other Ambulatory Visit: Payer: Self-pay | Admitting: Neurological Surgery

## 2015-12-22 DIAGNOSIS — N319 Neuromuscular dysfunction of bladder, unspecified: Secondary | ICD-10-CM | POA: Diagnosis not present

## 2015-12-22 DIAGNOSIS — R339 Retention of urine, unspecified: Secondary | ICD-10-CM | POA: Diagnosis not present

## 2015-12-22 DIAGNOSIS — G950 Syringomyelia and syringobulbia: Secondary | ICD-10-CM

## 2015-12-30 ENCOUNTER — Other Ambulatory Visit: Payer: PPO

## 2016-01-02 DIAGNOSIS — M25511 Pain in right shoulder: Secondary | ICD-10-CM | POA: Diagnosis not present

## 2016-01-02 DIAGNOSIS — M791 Myalgia: Secondary | ICD-10-CM | POA: Diagnosis not present

## 2016-01-02 DIAGNOSIS — G950 Syringomyelia and syringobulbia: Secondary | ICD-10-CM | POA: Diagnosis not present

## 2016-01-02 DIAGNOSIS — M25512 Pain in left shoulder: Secondary | ICD-10-CM | POA: Diagnosis not present

## 2016-01-02 DIAGNOSIS — G8929 Other chronic pain: Secondary | ICD-10-CM | POA: Diagnosis not present

## 2016-01-13 DIAGNOSIS — G904 Autonomic dysreflexia: Secondary | ICD-10-CM | POA: Diagnosis not present

## 2016-01-13 DIAGNOSIS — Z1211 Encounter for screening for malignant neoplasm of colon: Secondary | ICD-10-CM | POA: Diagnosis not present

## 2016-01-13 DIAGNOSIS — Z6824 Body mass index (BMI) 24.0-24.9, adult: Secondary | ICD-10-CM | POA: Diagnosis not present

## 2016-01-13 DIAGNOSIS — G8254 Quadriplegia, C5-C7 incomplete: Secondary | ICD-10-CM | POA: Diagnosis not present

## 2016-01-13 DIAGNOSIS — Z23 Encounter for immunization: Secondary | ICD-10-CM | POA: Diagnosis not present

## 2016-01-18 DIAGNOSIS — G904 Autonomic dysreflexia: Secondary | ICD-10-CM | POA: Diagnosis not present

## 2016-01-18 DIAGNOSIS — G8254 Quadriplegia, C5-C7 incomplete: Secondary | ICD-10-CM | POA: Diagnosis not present

## 2016-01-18 DIAGNOSIS — M2550 Pain in unspecified joint: Secondary | ICD-10-CM | POA: Diagnosis not present

## 2016-01-18 DIAGNOSIS — R339 Retention of urine, unspecified: Secondary | ICD-10-CM | POA: Diagnosis not present

## 2016-01-18 DIAGNOSIS — E349 Endocrine disorder, unspecified: Secondary | ICD-10-CM | POA: Diagnosis not present

## 2016-01-18 DIAGNOSIS — N319 Neuromuscular dysfunction of bladder, unspecified: Secondary | ICD-10-CM | POA: Diagnosis not present

## 2016-02-14 DIAGNOSIS — R339 Retention of urine, unspecified: Secondary | ICD-10-CM | POA: Diagnosis not present

## 2016-02-14 DIAGNOSIS — N319 Neuromuscular dysfunction of bladder, unspecified: Secondary | ICD-10-CM | POA: Diagnosis not present

## 2016-02-28 DIAGNOSIS — G950 Syringomyelia and syringobulbia: Secondary | ICD-10-CM | POA: Diagnosis not present

## 2016-02-29 ENCOUNTER — Other Ambulatory Visit: Payer: Self-pay | Admitting: Neurosurgery

## 2016-02-29 DIAGNOSIS — G950 Syringomyelia and syringobulbia: Secondary | ICD-10-CM

## 2016-03-01 DIAGNOSIS — E291 Testicular hypofunction: Secondary | ICD-10-CM | POA: Diagnosis not present

## 2016-03-07 DIAGNOSIS — G8254 Quadriplegia, C5-C7 incomplete: Secondary | ICD-10-CM | POA: Diagnosis not present

## 2016-03-07 DIAGNOSIS — M7581 Other shoulder lesions, right shoulder: Secondary | ICD-10-CM | POA: Diagnosis not present

## 2016-03-07 DIAGNOSIS — Z1389 Encounter for screening for other disorder: Secondary | ICD-10-CM | POA: Diagnosis not present

## 2016-03-07 DIAGNOSIS — Z6822 Body mass index (BMI) 22.0-22.9, adult: Secondary | ICD-10-CM | POA: Diagnosis not present

## 2016-03-07 DIAGNOSIS — M7582 Other shoulder lesions, left shoulder: Secondary | ICD-10-CM | POA: Diagnosis not present

## 2016-03-12 ENCOUNTER — Ambulatory Visit
Admission: RE | Admit: 2016-03-12 | Discharge: 2016-03-12 | Disposition: A | Discharge status: Home / Self Care | Payer: PPO | Source: Ambulatory Visit | Attending: Neurosurgery | Admitting: Neurosurgery

## 2016-03-12 DIAGNOSIS — G950 Syringomyelia and syringobulbia: Secondary | ICD-10-CM | POA: Diagnosis not present

## 2016-03-12 DIAGNOSIS — M5031 Other cervical disc degeneration,  high cervical region: Secondary | ICD-10-CM | POA: Insufficient documentation

## 2016-03-12 DIAGNOSIS — M4802 Spinal stenosis, cervical region: Secondary | ICD-10-CM | POA: Insufficient documentation

## 2016-03-12 DIAGNOSIS — M5124 Other intervertebral disc displacement, thoracic region: Secondary | ICD-10-CM | POA: Diagnosis not present

## 2016-03-14 DIAGNOSIS — R339 Retention of urine, unspecified: Secondary | ICD-10-CM | POA: Diagnosis not present

## 2016-03-14 DIAGNOSIS — N319 Neuromuscular dysfunction of bladder, unspecified: Secondary | ICD-10-CM | POA: Diagnosis not present

## 2016-03-30 DIAGNOSIS — E291 Testicular hypofunction: Secondary | ICD-10-CM | POA: Diagnosis not present

## 2016-04-09 DIAGNOSIS — Z6822 Body mass index (BMI) 22.0-22.9, adult: Secondary | ICD-10-CM | POA: Diagnosis not present

## 2016-04-09 DIAGNOSIS — G904 Autonomic dysreflexia: Secondary | ICD-10-CM | POA: Diagnosis not present

## 2016-04-09 DIAGNOSIS — I10 Essential (primary) hypertension: Secondary | ICD-10-CM | POA: Diagnosis not present

## 2016-04-09 DIAGNOSIS — G8254 Quadriplegia, C5-C7 incomplete: Secondary | ICD-10-CM | POA: Diagnosis not present

## 2016-04-09 DIAGNOSIS — E349 Endocrine disorder, unspecified: Secondary | ICD-10-CM | POA: Diagnosis not present

## 2016-04-10 DIAGNOSIS — N319 Neuromuscular dysfunction of bladder, unspecified: Secondary | ICD-10-CM | POA: Diagnosis not present

## 2016-04-10 DIAGNOSIS — R339 Retention of urine, unspecified: Secondary | ICD-10-CM | POA: Diagnosis not present

## 2016-04-12 DIAGNOSIS — N401 Enlarged prostate with lower urinary tract symptoms: Secondary | ICD-10-CM | POA: Diagnosis not present

## 2016-04-12 DIAGNOSIS — N310 Uninhibited neuropathic bladder, not elsewhere classified: Secondary | ICD-10-CM | POA: Diagnosis not present

## 2016-04-12 DIAGNOSIS — R338 Other retention of urine: Secondary | ICD-10-CM | POA: Diagnosis not present

## 2016-04-27 DIAGNOSIS — G8254 Quadriplegia, C5-C7 incomplete: Secondary | ICD-10-CM | POA: Diagnosis not present

## 2016-04-27 DIAGNOSIS — N401 Enlarged prostate with lower urinary tract symptoms: Secondary | ICD-10-CM | POA: Diagnosis not present

## 2016-04-27 DIAGNOSIS — N310 Uninhibited neuropathic bladder, not elsewhere classified: Secondary | ICD-10-CM | POA: Diagnosis not present

## 2016-05-01 DIAGNOSIS — E349 Endocrine disorder, unspecified: Secondary | ICD-10-CM | POA: Diagnosis not present

## 2016-05-01 DIAGNOSIS — G904 Autonomic dysreflexia: Secondary | ICD-10-CM | POA: Diagnosis not present

## 2016-05-01 DIAGNOSIS — G8254 Quadriplegia, C5-C7 incomplete: Secondary | ICD-10-CM | POA: Diagnosis not present

## 2016-05-28 DIAGNOSIS — S14109S Unspecified injury at unspecified level of cervical spinal cord, sequela: Secondary | ICD-10-CM | POA: Diagnosis not present

## 2016-05-28 DIAGNOSIS — G8250 Quadriplegia, unspecified: Secondary | ICD-10-CM | POA: Diagnosis not present

## 2016-05-28 DIAGNOSIS — N312 Flaccid neuropathic bladder, not elsewhere classified: Secondary | ICD-10-CM | POA: Diagnosis not present

## 2016-05-31 DIAGNOSIS — Z6822 Body mass index (BMI) 22.0-22.9, adult: Secondary | ICD-10-CM | POA: Diagnosis not present

## 2016-05-31 DIAGNOSIS — E349 Endocrine disorder, unspecified: Secondary | ICD-10-CM | POA: Diagnosis not present

## 2016-05-31 DIAGNOSIS — G904 Autonomic dysreflexia: Secondary | ICD-10-CM | POA: Diagnosis not present

## 2016-05-31 DIAGNOSIS — G8254 Quadriplegia, C5-C7 incomplete: Secondary | ICD-10-CM | POA: Diagnosis not present

## 2016-06-12 DIAGNOSIS — M7582 Other shoulder lesions, left shoulder: Secondary | ICD-10-CM | POA: Diagnosis not present

## 2016-06-14 DIAGNOSIS — G8250 Quadriplegia, unspecified: Secondary | ICD-10-CM | POA: Diagnosis not present

## 2016-06-15 DIAGNOSIS — N319 Neuromuscular dysfunction of bladder, unspecified: Secondary | ICD-10-CM | POA: Diagnosis not present

## 2016-06-15 DIAGNOSIS — R339 Retention of urine, unspecified: Secondary | ICD-10-CM | POA: Diagnosis not present

## 2016-06-17 DIAGNOSIS — G8250 Quadriplegia, unspecified: Secondary | ICD-10-CM | POA: Insufficient documentation

## 2016-06-17 DIAGNOSIS — N312 Flaccid neuropathic bladder, not elsewhere classified: Secondary | ICD-10-CM | POA: Insufficient documentation

## 2016-06-18 DIAGNOSIS — G8250 Quadriplegia, unspecified: Secondary | ICD-10-CM | POA: Diagnosis not present

## 2016-07-03 DIAGNOSIS — Z79899 Other long term (current) drug therapy: Secondary | ICD-10-CM | POA: Diagnosis not present

## 2016-07-03 DIAGNOSIS — K6289 Other specified diseases of anus and rectum: Secondary | ICD-10-CM | POA: Diagnosis not present

## 2016-07-03 DIAGNOSIS — G8254 Quadriplegia, C5-C7 incomplete: Secondary | ICD-10-CM | POA: Diagnosis not present

## 2016-07-03 DIAGNOSIS — E782 Mixed hyperlipidemia: Secondary | ICD-10-CM | POA: Diagnosis not present

## 2016-07-03 DIAGNOSIS — N390 Urinary tract infection, site not specified: Secondary | ICD-10-CM | POA: Diagnosis not present

## 2016-07-03 DIAGNOSIS — E349 Endocrine disorder, unspecified: Secondary | ICD-10-CM | POA: Diagnosis not present

## 2016-07-03 DIAGNOSIS — N319 Neuromuscular dysfunction of bladder, unspecified: Secondary | ICD-10-CM | POA: Diagnosis not present

## 2016-07-04 ENCOUNTER — Encounter: Payer: Self-pay | Admitting: *Deleted

## 2016-07-09 ENCOUNTER — Encounter: Payer: Self-pay | Admitting: *Deleted

## 2016-07-11 ENCOUNTER — Ambulatory Visit: Payer: Self-pay | Admitting: General Surgery

## 2016-07-13 DIAGNOSIS — N319 Neuromuscular dysfunction of bladder, unspecified: Secondary | ICD-10-CM | POA: Diagnosis not present

## 2016-07-13 DIAGNOSIS — R339 Retention of urine, unspecified: Secondary | ICD-10-CM | POA: Diagnosis not present

## 2016-07-19 ENCOUNTER — Ambulatory Visit: Payer: Self-pay | Admitting: General Surgery

## 2016-07-26 ENCOUNTER — Encounter: Payer: Self-pay | Admitting: *Deleted

## 2016-07-26 DIAGNOSIS — G8250 Quadriplegia, unspecified: Secondary | ICD-10-CM | POA: Diagnosis not present

## 2016-08-07 ENCOUNTER — Encounter: Payer: Self-pay | Admitting: *Deleted

## 2016-08-09 DIAGNOSIS — G8250 Quadriplegia, unspecified: Secondary | ICD-10-CM | POA: Diagnosis not present

## 2016-08-10 DIAGNOSIS — G8254 Quadriplegia, C5-C7 incomplete: Secondary | ICD-10-CM | POA: Diagnosis not present

## 2016-08-10 DIAGNOSIS — E349 Endocrine disorder, unspecified: Secondary | ICD-10-CM | POA: Diagnosis not present

## 2016-08-10 DIAGNOSIS — M545 Low back pain: Secondary | ICD-10-CM | POA: Diagnosis not present

## 2016-08-10 DIAGNOSIS — Z6822 Body mass index (BMI) 22.0-22.9, adult: Secondary | ICD-10-CM | POA: Diagnosis not present

## 2016-08-10 DIAGNOSIS — I10 Essential (primary) hypertension: Secondary | ICD-10-CM | POA: Diagnosis not present

## 2016-08-10 DIAGNOSIS — G904 Autonomic dysreflexia: Secondary | ICD-10-CM | POA: Diagnosis not present

## 2016-08-14 ENCOUNTER — Encounter: Payer: Self-pay | Admitting: General Surgery

## 2016-08-14 ENCOUNTER — Ambulatory Visit (INDEPENDENT_AMBULATORY_CARE_PROVIDER_SITE_OTHER): Payer: PPO | Admitting: General Surgery

## 2016-08-14 VITALS — BP 132/74 | HR 80 | Resp 14 | Ht 68.0 in | Wt 150.0 lb

## 2016-08-14 DIAGNOSIS — Z8719 Personal history of other diseases of the digestive system: Secondary | ICD-10-CM | POA: Diagnosis not present

## 2016-08-14 DIAGNOSIS — Z79899 Other long term (current) drug therapy: Secondary | ICD-10-CM | POA: Diagnosis not present
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
## 2016-08-14 NOTE — Patient Instructions (Signed)
The patient is aware to call back for any questions or concerns.  

## 2016-08-15 DIAGNOSIS — Z8719 Personal history of other diseases of the digestive system: Secondary | ICD-10-CM | POA: Insufficient documentation

## 2016-08-21 DIAGNOSIS — G8220 Paraplegia, unspecified: Secondary | ICD-10-CM | POA: Diagnosis not present

## 2016-08-24 DIAGNOSIS — G8254 Quadriplegia, C5-C7 incomplete: Secondary | ICD-10-CM | POA: Diagnosis not present

## 2016-08-24 DIAGNOSIS — Z6822 Body mass index (BMI) 22.0-22.9, adult: Secondary | ICD-10-CM | POA: Diagnosis not present

## 2016-08-24 DIAGNOSIS — M25511 Pain in right shoulder: Secondary | ICD-10-CM | POA: Diagnosis not present

## 2016-08-31 DIAGNOSIS — G8250 Quadriplegia, unspecified: Secondary | ICD-10-CM | POA: Diagnosis not present

## 2016-09-10 DIAGNOSIS — G8254 Quadriplegia, C5-C7 incomplete: Secondary | ICD-10-CM | POA: Diagnosis not present

## 2016-09-10 DIAGNOSIS — Z9181 History of falling: Secondary | ICD-10-CM | POA: Diagnosis not present

## 2016-09-10 DIAGNOSIS — E349 Endocrine disorder, unspecified: Secondary | ICD-10-CM | POA: Diagnosis not present

## 2016-09-12 DIAGNOSIS — R339 Retention of urine, unspecified: Secondary | ICD-10-CM | POA: Diagnosis not present

## 2016-09-12 DIAGNOSIS — N319 Neuromuscular dysfunction of bladder, unspecified: Secondary | ICD-10-CM | POA: Diagnosis not present

## 2016-09-13 DIAGNOSIS — F1190 Opioid use, unspecified, uncomplicated: Secondary | ICD-10-CM | POA: Insufficient documentation

## 2016-09-13 DIAGNOSIS — Z79891 Long term (current) use of opiate analgesic: Secondary | ICD-10-CM | POA: Insufficient documentation

## 2016-09-13 DIAGNOSIS — M19039 Primary osteoarthritis, unspecified wrist: Secondary | ICD-10-CM | POA: Insufficient documentation

## 2016-09-13 DIAGNOSIS — G894 Chronic pain syndrome: Secondary | ICD-10-CM | POA: Insufficient documentation

## 2016-09-13 DIAGNOSIS — G8929 Other chronic pain: Secondary | ICD-10-CM | POA: Insufficient documentation

## 2016-09-17 ENCOUNTER — Ambulatory Visit: Payer: PPO | Admitting: Nurse Practitioner

## 2016-09-25 DIAGNOSIS — N390 Urinary tract infection, site not specified: Secondary | ICD-10-CM | POA: Diagnosis not present

## 2016-09-25 DIAGNOSIS — N310 Uninhibited neuropathic bladder, not elsewhere classified: Secondary | ICD-10-CM | POA: Diagnosis not present

## 2016-09-25 DIAGNOSIS — G8220 Paraplegia, unspecified: Secondary | ICD-10-CM | POA: Diagnosis not present

## 2016-09-26 NOTE — Progress Notes (Signed)
Patient's Name: Jesse Whitney  MRN: [id number]  Referring Provider: [name], PA-C  DOB: 15-Oct-1955  PCP: [name], PA-C  DOS: 09/27/2016  Note by: Dionisio David NP  Service setting: Ambulatory outpatient  Specialty: Interventional Pain Management  Location: [facility] (AMB) Pain Management Facility    Patient type: New Patient    Primary Reason(s) for Visit: Initial Patient Evaluation CC: Neck Pain; Arm Pain (bilateral, to hands); and Shoulder Pain (bilateral)  HPI  Mr. Bordelon is a 61 y.o. year old, male patient, who comes today for an initial evaluation. He has Cervical spinal cord injury ([place]); Functional quadriplegia ([place]); DJD of shoulder; Adhesive capsulitis of shoulder; Autonomic dysreflexia; History of hemorrhoids; Atonic neurogenic bladder; Localized primary osteoarthritis of wrist; Quadriplegia ([place]); Long term current use of opiate analgesic; Long term prescription opiate use; Opiate use; Chronic pain syndrome; Chronic shoulder pain; and Syringomyelia ([place]) on his problem list.. His primarily concern today is the Neck Pain; Arm Pain (bilateral, to hands); and Shoulder Pain (bilateral)  Pain Assessment: Location:   Neck Radiating: both shoulders and both arms Onset: More than a month ago Duration: Chronic pain Quality: Pressure Severity: 6 /10 (self-reported pain score)  Note: Reported level is compatible with observation. Clinically the patient looks like a 61/10 Information on the proper use of the pain scale provided to the patient today Effect on ADL:   Timing: Constant Modifying factors: lying on either side  Onset and Duration: Date of onset: 12 years ago, patient is a quadraplegic Cause of pain: quadraplegic Severity: Getting worse, NAS-11 at its worse: 8/10, NAS-11 at its best: 3/10, NAS-11 now: 6/10 and NAS-11 on the average: 6/10 Timing: Not influenced by the time of the day and After activity or exercise Aggravating Factors: Prolonged sitting Alleviating  Factors: Lying down Associated Problems: Fatigue, Spasms, Sweating, Temperature changes, Pain that wakes patient up and Pain that does not allow patient to sleep Quality of Pain: Agonizing, Constant, Deep, Disabling, Distressing, Fearful, Heavy, Nagging, Pulsating, Punishing, Shooting, Sickening, Tender, Throbbing, Tiring, Toothache-like and Uncomfortable Previous Examinations or Tests: CT scan, MRI scan, Spinal tap, Neurological evaluation, Neurosurgical evaluation and Orthoperdic evaluation Previous Treatments: Trigger point injections  The patient comes into the clinics today for the first time for a chronic pain management evaluation. According to patient his primary area of pain is in his neck and shoulders. He suffered a C5, C6 and C6-7 spinal cord injury approximately 61 years ago. He has bilateral shoulder pain, he has had injections in the past with good results. He denies any recent physical therapy. However when he was in physical therapy he needed for approximately 3 months 3 times weekly for 12 effective. Description to strengthen his shoulders and arms he is able to lift to self despite diagnosis of quadriplegic. He admits that he can feel axillary region across. He has been treated by his primary care physician is passed away. He is on high-dose narcotics; MME 495 per day combination of fentanyl patch and oxycodone.  Today I took the time to provide the patient with information regarding this pain practice. The patient was informed that the practice is divided into two sections: an interventional pain management section, as well as a completely separate and distinct medication management section. I explained that there are procedure days for interventional therapies, and evaluation days for follow-ups and medication management. Because of the amount of documentation required during both, they are kept separated. This means that there is the possibility that he may be scheduled for  a procedure  on one day, and medication management the next. I have also informed his that because of staffing and facility limitations, this practice will no longer take patients for medication management only. To illustrate the reasons for this, I gave the patient the example of surgeons, and how inappropriate it would be to refer a patient to his/her care, just to write for the post-surgical antibiotics on a surgery done by a different surgeon.   Because interventional pain management is part of the board-certified specialty for the doctors, the patient was informed that joining this practice means that they are open to any and all interventional therapies. I made it clear that this does not mean that they will be forced to have any procedures done. What this means is that I believe interventional therapies to be essential part of the diagnosis and proper management of chronic pain conditions. Therefore, patients not interested in these interventional alternatives will be better served under the care of a different practitioner.  The patient was also made aware of my Comprehensive Pain Management Safety Guidelines where by joining this practice, they limit all of their nerve blocks and joint injections to those done by our practice, for as long as we are retained to manage their care. Historic Controlled Substance Pharmacotherapy Review  PMP and historical list of controlled substances: Diazepam 5 mg, fentanyl 100 g, oxycodone 15 mg, testosterone 200 g/mL, hydrocodone 5/325 mg, MS Contin 60 mg, oxycodone 20 mg, morphine sulfate 30 mg, oxycodone 30 mg, Nucynta 50 mg Highest opioid analgesic regimen found:  morphine sulfate 30 mg 3 times daily (morphine 90 mg), fentanyl 100 g patch every 48 hours, oxycodone 30 mg 5 times daily (Oxycodone 181m) (Fill date 04/02/2014) Most recent opioid analgesic: Fentanyl 100 g patch every 48 hours, oxycodone 15 mg 6 times daily (fill date 09/12/2016) Current opioid analgesics:   Fentanyl 100 g patch every 48 hours, oxycodone 15 mg 6 times daily (fill date 09/12/2016) Highest recorded MME/day: 585 mg/day MME/day: 495 mg/day Medications: The patient did not bring the medication(s) to the appointment, as requested in our "New Patient Package" Pharmacodynamics: Desired effects: Analgesia: The patient reports >50% benefit. Reported improvement in function: The patient reports medication allows him to accomplish basic ADLs. Clinically meaningful improvement in function (CMIF): Sustained CMIF goals met Perceived effectiveness: Described as relatively effective, allowing for increase in activities of daily living (ADL) Undesirable effects: Side-effects or Adverse reactions: None reported Historical Monitoring: The patient  reports that he does not use drugs. List of all UDS Test(s): No results found for: MDMA, COCAINSCRNUR, PCPSCRNUR, PCPQUANT, CANNABQUANT, THCU, EKaaawaList of all Serum Drug Screening Test(s):  No results found for: AMPHSCRSER, BARBSCRSER, BENZOSCRSER, COCAINSCRSER, PCPSCRSER, PCPQUANT, THCSCRSER, CANNABQUANT, OPIATESCRSER, OXYSCRSER, PROPOXSCRSER Historical Background Evaluation: [place] PDMP: Six (6) year initial data search conducted.             [place] Department of public safety, offender search: (Editor, commissioningInformation) Non-contributory Risk Assessment Profile: Aberrant behavior: None observed or detected today Risk factors for fatal opioid overdose: Concomitant use of Benzodiazepines, Male gender, Caucasian and High daily dosage Fatal overdose hazard ratio (HR): 2.04 for doses equal to, or higher than 100 MME/day Non-fatal overdose hazard ratio (HR): 2.88 for doses equal to, or higher than 200 MME/day Risk of opioid abuse or dependence: 0.7-3.0% with doses ? 36 MME/day and 6.1-26% with doses ? 120 MME/day. Substance use disorder (SUD) risk level: Pending results of Medical Psychology Evaluation for SUD Opioid risk tool (ORT) (Total Score):  3  ORT Scoring  interpretation table:  Score <3 = Low Risk for SUD  Score between 4-7 = Moderate Risk for SUD  Score >8 = High Risk for Opioid Abuse   PHQ-2 Depression Scale:  Total score: 0  PHQ-2 Scoring interpretation table: (Score and probability of major depressive disorder)  Score 0 = No depression  Score 1 = 15.4% Probability  Score 2 = 21.1% Probability  Score 3 = 38.4% Probability  Score 4 = 45.5% Probability  Score 5 = 56.4% Probability  Score 6 = 78.6% Probability   PHQ-9 Depression Scale:  Total score: 0  PHQ-9 Scoring interpretation table:  Score 0-4 = No depression  Score 5-9 = Mild depression  Score 10-14 = Moderate depression  Score 15-19 = Moderately severe depression  Score 20-27 = Severe depression (2.4 times higher risk of SUD and 2.89 times higher risk of overuse)   Pharmacologic Plan: Pending ordered tests and/or consults  Meds  The patient has a current medication list which includes the following prescription(s): clonidine, diazepam, fentanyl, fentanyl, fluticasone, oxycodone, sulfamethoxazole-trimethoprim, and gabapentin.  Current Outpatient Prescriptions on File Prior to Visit  Medication Sig  . cloNIDine (CATAPRES) 0.1 MG tablet Take 0.1 mg by mouth 2 (two) times daily as needed.   . diazepam (VALIUM) 5 MG tablet Take 5 mg by mouth every 6 (six) hours as needed for anxiety.  . fentaNYL (DURAGESIC - DOSED MCG/HR) 100 MCG/HR Place 100 mcg onto the skin every other day.  . fentaNYL (DURAGESIC - DOSED MCG/HR) 100 MCG/HR Place onto the skin every other day.   . fluticasone (FLONASE) 50 MCG/ACT nasal spray Place 2 sprays into both nostrils as needed for allergies or rhinitis.  Marland Kitchen oxycodone (ROXICODONE) 30 MG immediate release tablet Take 30 mg by mouth 2 (two) times daily.  Marland Kitchen sulfamethoxazole-trimethoprim (BACTRIM DS,SEPTRA DS) 800-160 MG tablet Take 1 tablet by mouth as needed.  . gabapentin (NEURONTIN) 300 MG capsule Take 300 mg by mouth as needed.   No current  facility-administered medications on file prior to visit.    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 Bilateral shoulder and upper extremity pain.  EXAM: MRI CERVICAL SPINE WITHOUT CONTRAST  TECHNIQUE: Multiplanar, multisequence MR imaging of the cervical spine was performed. No intravenous contrast was administered.  COMPARISON:  03/13/2007 cervical spine MRI. 04/05/2008 thoracic spine MRI.  FINDINGS: Increased image noise due to inability to use the anterior portion of the cervical coil. There is also mild motion artifact.  Alignment: Slight retrolisthesis of C3 on C4 in C4 on C5, unchanged.  Vertebrae: Artifact from cerclage wires related to C6-T1 posterior fusion. Chronic fracture deformity of the C7 vertebral body with C6-7 interbody fusion. No focal osseous lesion or significant marrow edema identified. Mild multilevel type 2 degenerative endplate changes.  Cord: Chronic spinal cord transection at the C6-7 level with unchanged appearance of large syrinx extending from this level inferiorly into the thoracic spine. Small syrinx extending superiorly in the cervical spinal cord is grossly unchanged within limitations of image noise and motion artifact.  Posterior Fossa, vertebral arteries, paraspinal tissues: A normal right vertebral artery flow void is not clearly seen and may reflect vessel occlusion or hypoplasia. There is a normal, robust appearing left vertebral artery flow void.  Disc levels:  C2-3: Mild left  facet arthrosis has mildly progressed from 2008. No stenosis.  C3-4: Mild disc space narrowing. Disc bulging and uncovertebral spurring result in mild spinal stenosis and moderate to severe right and mild left neural foraminal stenosis, similar to  2008.  C4-5: Moderate disc space narrowing. Disc bulging and asymmetric right uncovertebral spurring result in moderate to severe right and mild left neural foraminal stenosis and borderline to mild spinal stenosis, not significantly changed.  C5-6:  Negative.  C6-7: Prior fusion with associated artifact. Mild retropulsion of the C7 posterior vertebral body, unchanged. No significant stenosis.  C7-T1:  Negative.  IMPRESSION: 1. 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 Similar appearance of small syrinx extending superiorly in the cervical spine. 2. Unchanged cervical disc degeneration, most notable at C3-4 and C4-5 where there is mild spinal stenosis and moderate to severe right neural foraminal stenosis.   Electronically Signed   By: Logan Bores M.D.   On: 03/12/2016 16:01    Cervical MR wo contrast:  Results for orders placed in visit on 03/13/07  MR [facility] W/O Cm   Narrative * PRIOR REPORT IMPORTED FROM AN EXTERNAL SYSTEM *   PRIOR REPORT IMPORTED FROM THE [facility] [place] EXAM:    cervical syrinx  COMMENTS:   PROCEDURE:     MR  - MR CERVICAL SPINE WO CONT  - Mar 13 2007  1:04PM   RESULT:     Comparison: No available comparison exam.   Procedure: Multiplanar, multisequence MR examination of the cervical spine  was performed without gadolinium contrast. Patient has gadolinium allergy.   Findings:   Hardware involving the C6-T1 spinous processes limit evaluation. There is  partial fusion of the C6-T1 vertebrae. There is straightening of the  cervical spine without significant malalignment. The cervicomedullary  junction and upper cervical cord are unremarkable. There is cystic  dilatation (up to 1.5 cm) of the cord starting at approximately the C6  level, extending down to at least the T10 level at the edge of the field  of  view. There is effacement of  the lower cervical and upper to mid thoracic  cord parenchyma.   There is no significant bone marrow signal abnormality to suggest an acute  fracture or a suspicious osseous lesion.   C3-C4: There is disc desiccation with mild disc height loss. There is mild  diffuse disc bulge. There is mild canal narrowing. There is moderate right  neural foraminal narrowing.   C4-C5: There is disc desiccation with mild disc height loss. There is mild  diffuse disc bulge. There is mild canal narrowing. There is moderate right  and mild left neural foraminal narrowing.   The left vertebral artery is dominant.   IMPRESSION:   1. Syrinx is seen extending from C6 at least to the T10 level. The distal  aspect of the syrinx is not fully image.   2. C6-T1 spinous processes hardware.   Thank you for this opportunity to contribute to the care of your patient.       Cervical DG 2-3 views:  Results for orders placed in visit on 12/08/99  DG Cervical Spine 2-3 Views   Narrative FINDINGS CLINICAL:  THORACIC LAMINECTOMY. CERVICAL SPINE 2 VIEWS: NO COMPARISON.  LAMINECTOMY WITH POSTERIOR CERCLAGE WIRE SUTURE AND INTERBODY FUSION IS SEEN FROM C6 THROUGH T1.  THE VERTEBRAL ALIGNMENT IS SATISFACTORY.  ADVANCED DEGENERATIVE DISC SPACE NARROWING WITH SLIGHT ANTERIOR SPONDYLOSIS  IS SEEN AT C4-5.  NO OTHER SIGNIFICANT ABNORMALITY IS SEEN. IMPRESSION 1.  DEGENERATIVE DISC AND VERTEBRAL CHANGE AT C4-5. 2.  SATISFACTORY POST-OPERATIVE CHANGES AT C6 THROUGH T1.   Cervical DG complete:  Results for orders placed in visit on 04/27/99  DG Cervical Spine Complete   Narrative FINDINGS CLINICAL DATA:  PRIOR CERVICAL FIXATION FOR TRAUMA.  NEW SPASMS IN LEGS. CERVICAL SPINE, COMPLETE: OPEN MOUTH ODONTOID, AP, LATERAL, AND OBLIQUE VIEWS WERE OBTAINED.  THERE IS STRAIGHTENING OF THE CERVICAL SPINE.  THERE IS MARKED DISK SPACE NARROWING AT C4-5 WITH VERTEBRAL BODY OSTEOPHYTIC FORMATION.  THERE ARE ADVANCED DEGENERATIVE  CHANGES INVOLVING THE RIGHT C4-5 FACET JOINT WITH PROMINENT OSTEOPHYTIC FORMATION ENCROACHING ON THE FORAMEN.  THERE IS MODERATE FORAMINAL STENOSIS ON THE LEFT AT C4-5 AND MARKED FORAMINAL STENOSIS ON THE RIGHT.  THERE IS POSTERIOR CERCLAGE WIRE WHICH ENCIRCLES THE SPINAL LAMINAR JUNCTION AT C6 AND THE SPINOUS PROCESSES OF C7, T1, AND T2 SEGMENTS.  BY PLAIN FILM ANALYSIS, IT APPEARS THAT THERE IS PROBABLY SOLID INTERBODY FUSION AT C6-7 AND AT C7-T1, MAINLY AT THE FORMER LEVEL.   I GET THE IMPRESSION THERE MAY BE THIN WAFFER-LIKE INTERBODY FUSION PLUG AT C7-T1.  NO SUBLUXATION. IMPRESSION BY PLAIN FILM ANALYSIS THE C6 THROUGH T2 FUSION SITE APPEARS STABLE.  ADVANCED DDD AT C4-5.  MILD KYPHOSIS.  FORAMINAL STENOSIS, PARTICULARLY ON THE RIGHT AT C4-5. MRI CERVICAL SPINE: MULTIPLANAR, MULTISEQUENCE IMAGES WERE ACQUIRED ACCORDING TO STANDARD PROTOCOL AND COMPARISON IS MADE TO THE 01/01/96 MRI [place] [facility].   PATIENT HAS A HISTORY OF FRACTURE DISLOCATION AT C6-7.  RETROPULSION OF THE POSTERIOR SUPERIOR ASPECT OF C7 VERTEBRAL BODY IS AGAIN NOTED. NARROWING OF THE SAGITTAL DIMENSIONS OF THE CENTRAL CANAL.   THERE IS NO EVIDENCE OF MARROW EDEMA OR SUBLUXATION.  THERE IS SOLID-APPEARING FUSION FROM C6 TO T2.  CERCLAGE WIRES PRODUCE SUSCEPTIBILITY ARTIFACT AS EXPECTED.   AGAIN APPRECIATED IS A SMALL SYRINX WITHIN THE CENTRAL ASPECT OF CORD BEGINNING AT C5-6 AND EXTENDING UP TO THE C2-3 LEVEL AS BEST APPRECIATED ON AXIAL IMAGES.  AS NOTED IN THE PAST, THERE IS CORD TRANSECTION AT THE MID INFERIOR C6 LEVEL WITH ESSENTIALLY ONLY A FLUID FILLED STRUCTURE EXTENDING DOWN TO THE MID T11 LEVEL.  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  THERE IS EVIDENCE OF EXTENSION OF THE SYRINX INTO THE DISTAL ASPECT OF THE THORACIC CORD DOWN TO THE MID T11 LEVEL. AGAIN APPRECIATED IS MODERATE FORAMINAL STENOSIS ON THE RIGHT AT C4-5, SMALL RIGHT PARACENTRAL  DISK PROTRUSION AT C4-5, AND MILD BROAD BASED POSTERIOR ANNULAR BULGING AT C3-4.  NO NEW FINDINGS ARE APPRECIATED WHEN COMPARED TO 01/01/96 MRI STUDY. IMPRESSION: CORD TRANSECTION CHANGES AGAIN NOTED.  SEVERE LOWER CERVICAL AND THORACIC SYRINGOHYDROMYELIA. SEVERE CORD ATROPHY CAUDAD TO THE C6 CORD TRANSECTION LEVEL.   MARKED SYRINGOHYDROMYELIA INVOLVES THE LOWER THORACIC CORD AS WELL EXTENDING DOWN TO THE MID T11 LEVEL.  SEE COMMENTS ABOVE.   Shoulder Imaging:  Shoulder-R MR wo contrast:  Results for orders placed in visit on 10/09/11  MR Shoulder Right Wo Contrast   Narrative * PRIOR REPORT IMPORTED FROM AN EXTERNAL SYSTEM    PRIOR REPORT IMPORTED FROM THE [facility] SYSTEM   REASON FOR EXAM:    rt shoulder pain  weakness  no response to injection  COMMENTS:   PROCEDURE:     MR  - MR SHOULDER RT  WO CONTRAST  - Oct 09 2011  4:58PM   RESULT:  History: Pain.   Comparison Studies: Prior MRI of the right shoulder report of 04/04/2004.   Findings: Multiplanar, multisequence imaging of the right shoulder was  obtained. Acromioclavicular degenerative change with downward sloping  acromion and subacromial spurring is present. Supraspinatus tendinous tear  is noted in the mid supraspinatus tendon. The infraspinatus, teres minor  and  subscapularis tendons are intact. The long head of the biceps tendon is  intact. The humeral head is intact. Glenoid labrum is intact. Subacromion  and subdeltoid fluid noted.   IMPRESSION:   1. Acromioclavicular degenerative change with subacromial spurring and  downward sloping acromion with complete tear of the mid supraspinatus  tendon  with mild proximal tendinous retraction.   Thank you for the opportunity to contribute to the care of your patient.        Thoracic Imaging: Thoracic MR wo contrast:  Results for orders placed during the hospital encounter of 03/12/16  MR THORACIC SPINE WO CONTRAST   Narrative CLINICAL DATA:  Remote  cervical spine fracture with cord transection and spinal cord syrinx. Bilateral shoulder and upper extremity pain.  EXAM: MRI THORACIC SPINE WITHOUT CONTRAST  TECHNIQUE: Multiplanar, multisequence MR imaging of the thoracic spine was performed. No intravenous contrast was administered.  COMPARISON:  Thoracic spine MRI 04/05/2008  FINDINGS: Alignment:  Normal.  Vertebrae: No evidence of fracture, osseous lesion, or marrow edema. Susceptibility artifact related to posterior fusion at the T1 level.  Cord: Unchanged appearance of large syrinx extending from the lower cervical spine throughout the thoracic spinal cord to the T11 level.  Paraspinal and other soft tissues: Trace bilateral pleural effusions.  Disc levels:  A shallow central to left paracentral disc protrusion at T8-9 has increased in size but does not result in stenosis or spinal cord mass effect. No disc herniation, spinal stenosis, or neural foraminal stenosis is seen elsewhere in the thoracic spine.  IMPRESSION: 1. 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 2. T8-9 disc protrusion without stenosis.   Electronically Signed   By: Logan Bores M.D.   On: 03/12/2016 16:08     Thoracic MR w/wo contrast:  Results for orders placed during the hospital encounter of 05/01/03  MR Thoracic Spine W Wo Contrast   Narrative Clinical Data:    Paraplegic.  Previous cervicothoracic fusion.  No complaints or symptoms.  Follow-up syringotomy.   MRI THORACIC SPINE WITHOUT AND WITH CONTRAST Multiplanar T1- and T2- weighted images were obtained before and after administration of 15 cc Omniscan.  There has been a previous fusion from C6 through T1 posteriorly.  The C6-7 and C7-T1 discs are rudimentary.  Alignment appears satisfactory.   There is a large syrinx affecting the thoracic cord from the surgical site downward to T11.  The cross sectional measurements of the cord opposite T5-6, where it is largely filled  with CSF, measures 12.0 X 15.0 mm.  It is likely the syrinx is resulting from altered CSF dynamics at the site of injury.  Thoracic alignment is satisfactory.  There is a small incidental disc protrusion at T4-5 on the right.  The cervical spine was not studied.   When compared with previous study from 03/13/00 there is little change.  IMPRESSION Large syrinx affecting nearly the entire thoracic cord as described above with little change from December 2001.  Provider: Richarda Overlie    Note: Available results from prior imaging studies were reviewed.        ROS  Cardiovascular History: No reported cardiovascular signs or symptoms  such as High blood pressure, coronary artery disease, abnormal heart rate or rhythm, heart attack, blood thinner therapy or heart weakness and/or failure Pulmonary or Respiratory History: No reported pulmonary signs or symptoms such as wheezing and difficulty taking a deep full breath (Asthma), difficulty blowing air out (Emphysema), coughing up mucus (Bronchitis), persistent dry cough, or temporary stoppage of breathing during sleep Neurological History: No reported neurological signs or symptoms such as seizures, abnormal skin sensations, urinary and/or fecal incontinence, being born with an abnormal open spine and/or a tethered spinal cord Review of Past Neurological Studies: No results found for this or any previous visit. Psychological-Psychiatric History: No reported psychological or psychiatric signs or symptoms such as difficulty sleeping, anxiety, depression, delusions or hallucinations (schizophrenial), mood swings (bipolar disorders) or suicidal ideations or attempts Gastrointestinal History: Reflux or heatburn Genitourinary History: No reported renal or genitourinary signs or symptoms such as difficulty voiding or producing urine, peeing blood, non-functioning kidney, kidney stones, difficulty emptying the bladder, difficulty controlling the flow of urine, or  chronic kidney disease Hematological History: No reported hematological signs or symptoms such as prolonged bleeding, low or poor functioning platelets, bruising or bleeding easily, hereditary bleeding problems, low energy levels due to low hemoglobin or being anemic Endocrine History: No reported endocrine signs or symptoms such as high or low blood sugar, rapid heart rate due to high thyroid levels, obesity or weight gain due to slow thyroid or thyroid disease Rheumatologic History: No reported rheumatological signs and symptoms such as fatigue, joint pain, tenderness, swelling, redness, heat, stiffness, decreased range of motion, with or without associated rash Musculoskeletal History: Negative for myasthenia gravis, muscular dystrophy, multiple sclerosis or malignant hyperthermia Work History: Disabled  Allergies  Mr. Sees is allergic to contrast media [iodinated diagnostic agents] and iohexol.  Laboratory Chemistry  Inflammation Markers No results found for: CRP, ESRSEDRATE (CRP: Acute Phase) (ESR: Chronic Phase) Renal Function Markers No results found for: BUN, CREATININE, GFRAA, GFRNONAA Hepatic Function Markers No results found for: AST, ALT, ALBUMIN, ALKPHOS, HCVAB Electrolytes No results found for: NA, K, CL, CALCIUM, MG Neuropathy Markers No results found for: [id number] Bone Pathology Markers No results found for: Hendricks Milo, [id number], [id number], [id number], 25OHVITD1, 25OHVITD2, 25OHVITD3, CALCIUM, TESTOFREE, TESTOSTERONE Coagulation Parameters No results found for: INR, LABPROT, APTT, PLT Cardiovascular Markers No results found for: BNP, HGB, HCT Note: Lab results reviewed.  PFSH  Drug: Mr. Anastacio  reports that he does not use drugs. Alcohol:  reports that he drinks about 2.4 oz of alcohol per week . Tobacco:  reports that he has never smoked. He quit smokeless tobacco use about 30 years ago. His smokeless tobacco use included Chew. 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 Family: family history includes Breast cancer (age of onset: 54) in his mother.  Past Surgical History:  Procedure Laterality Date  . BACK SURGERY    . [place]  2008  . SPINE SURGERY     Active Ambulatory Problems    Diagnosis Date Noted  . Cervical spinal cord injury ([place]) 02/10/2015  . Functional quadriplegia ([place]) 02/10/2015  . 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 History of hemorrhoids 08/15/2016  . Atonic neurogenic bladder 06/17/2016  . 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 Quadriplegia ([place]) 06/17/2016  . Long term current use of opiate  analgesic 09/13/2016  . Long term prescription opiate use 09/13/2016  . Opiate use 09/13/2016  . Chronic pain syndrome 09/13/2016  . Chronic shoulder pain 10/02/2016  . Syringomyelia ([place]) 10/02/2016   Resolved Ambulatory Problems    Diagnosis Date Noted  . No Resolved Ambulatory Problems   Past Medical History:  Diagnosis Date  . Hypertension   . 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 Spinal cord injury at C5-C7 level without injury of spinal bone ([place])    Constitutional Exam  General appearance: Well nourished, well developed, and well hydrated. In no apparent acute distress Vitals:   09/27/16 1306  BP: (!) 154/86  Pulse: 88  Resp: 18  Temp: 98.2 F (36.8 C)  TempSrc: Oral  SpO2: 97%  Weight: 150 lb (68 kg)  Height: 5' 8" (1.727 m)   BMI Assessment: Estimated body mass index is 22.81 kg/m as calculated from the following:   Height as of this encounter: 5' 8" (1.727 m).   Weight as of this encounter: 150 lb (68 kg).  BMI interpretation table: BMI level Category Range association with higher incidence of chronic pain  <18 kg/m2 Underweight    18.5-24.9 kg/m2 Ideal body weight   25-29.9 kg/m2 Overweight Increased incidence by 20%  30-34.9 kg/m2 Obese (Class I) Increased incidence by 68%  35-39.9 kg/m2 Severe obesity (Class II) Increased incidence by 136%  >40 kg/m2 Extreme obesity (Class III) Increased incidence by 254%   BMI Readings from Last 4 Encounters:  09/27/16 22.81 kg/m  08/14/16 22.81 kg/m  02/21/15 25.09 kg/m  02/10/15 22.81 kg/m   Wt Readings from Last 4 Encounters:  09/27/16 150 lb (68 kg)  08/14/16 150 lb (68 kg)  02/21/15 165 lb (74.8 kg)  02/10/15 150 lb (68 kg)  Psych/Mental status: Alert, oriented x 3 (person, place, & time)       Eyes: PERLA Respiratory: No evidence of acute respiratory distress  Cervical Spine Exam  Inspection: No masses, redness, or swelling Alignment: Symmetrical Functional ROM: Unrestricted ROM      Stability: No instability detected Muscle strength & Tone: Functionally intact Sensory: Unimpaired Palpation: No palpable anomalies              Upper Extremity (UE) Exam    Side: Right upper extremity  Side: Left upper extremity  Inspection: No masses, redness, swelling, or asymmetry. No contractures  Inspection: No masses, redness, swelling, or asymmetry. No contractures  Functional ROM: Unrestricted ROM          Functional ROM: Unrestricted ROM          Muscle strength & Tone: Normal strength (5/5)  Muscle strength & Tone: able to lift body out of chair using hand but unable to grasp left hand  Sensory: Unimpaired  Sensory: Unimpaired  Palpation: No palpable anomalies              Palpation: No palpable anomalies              Specialized Test(s): Deferred         Specialized Test(s): Deferred          Thoracic Spine Exam  Inspection: No masses, redness, or swelling Alignment: Symmetrical Functional ROM: Unrestricted ROM Stability: No instability detected Sensory: Unimpaired Muscle strength & Tone: No palpable anomalies  Lumbar Spine Exam  Inspection: No masses,  redness, or swelling Alignment: Symmetrical Functional ROM: Unrestricted ROM      Stability: No instability detected Muscle strength & Tone: Functionally intact Sensory: Unimpaired  Palpation: No palpable anomalies       Provocative Tests: Lumbar Hyperextension and rotation test: evaluation deferred today       Patrick's Maneuver: evaluation deferred today                    Gait & Posture Assessment  Ambulation: Patient ambulates using a wheel chair Gait: Relatively normal for age and body habitus Posture: WNL   Lower Extremity Exam    Side: Right lower extremity  Side: Left lower extremity  Inspection: No masses, redness, swelling, or asymmetry. No contractures  Inspection: No masses, redness, swelling, or asymmetry. No contractures  Functional ROM: Unrestricted ROM          Functional ROM: Unrestricted ROM          Muscle strength & Tone: Functionally intact  Muscle strength & Tone: Functionally intact  Sensory: Unimpaired  Sensory: Unimpaired  Palpation: No palpable anomalies  Palpation: No palpable anomalies   Assessment  Primary Diagnosis & Pertinent Problem List: The primary encounter diagnosis was Autonomic dysreflexia. 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  Visit Diagnosis: 1. Autonomic dysreflexia   2. Chronic pain of both shoulders   3. 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 Injury of cervical spinal cord, subsequent encounter ([place])   5. Functional quadriplegia ([facility])   6. Chronic pain syndrome    Plan of Care  Initial treatment plan:  Please be advised that as per protocol, today's visit has been an evaluation only. We have not taken over the patient's controlled substance management.  Problem-specific plan: No problem-specific Assessment & Plan notes found for this encounter.  Ordered Lab-work,  Procedure(s), Referral(s), & Consult(s): Orders Placed This Encounter  Procedures  . Ambulatory referral to Neurosurgery   Pharmacotherapy: Medications ordered:  No orders of the defined types were placed in this encounter.  Medications administered during this visit: Mr. Zarcone had no medications administered during this visit.   Pharmacotherapy under consideration:  Opioid Analgesics: The patient was informed that there is no guarantee that he would be a candidate for opioid analgesics. The decision will be made following CDC guidelines. This decision will be based on the results of diagnostic studies, as well as Mr. Repass risk profile.  Membrane stabilizer: To be determined at a later time Muscle relaxant: To be determined at a later time NSAID: To be determined at a later time Other analgesic(s): To be determined at a later time   Interventional therapies under consideration: Mr. Fennell was informed that there is no guarantee that he would be a candidate for interventional therapies. The decision will be based on the results of diagnostic studies, as well as Mr. Nuzum risk profile.  Possible procedure(s): Not at this time    Provider-requested follow-up: Return for 2nd Visit, w/ Dr. Dossie Arbour, after [facility] eval.  No future appointments.  Primary Care Physician: [name], PA-C Location: [facility] Outpatient Pain Management Facility Note by:  Date: 09/27/2016; Time: 4:17 PM  Pain Score Disclaimer: We use the NRS-11 scale. This is a self-reported, subjective measurement of pain severity with only modest accuracy. It is used primarily to identify changes within a particular patient. It must be understood that outpatient pain scales are significantly less accurate that those used for research, where they can be applied under ideal controlled circumstances with minimal exposure to variables. In reality, the score is  likely to be a combination of pain intensity and pain  affect, where pain affect describes the degree of emotional arousal or changes in action readiness caused by the sensory experience of pain. Factors such as social and work situation, setting, emotional state, anxiety levels, expectation, and prior pain experience may influence pain perception and show large inter-individual differences that may also be affected by time variables.  Patient instructions provided during this appointment: Patient Instructions    ____________________________________________________________________________________________  Appointment Policy Summary  It is our goal and responsibility to provide the medical community with assistance in the evaluation and management of patients with chronic pain. Unfortunately our resources are limited. Because we do not have an unlimited amount of time, or available appointments, we are required to closely monitor and manage their use. The following rules exist to maximize their use:  Patient's responsibilities: 1. Punctuality: You are required to be physically present and registered in our facility at least 30 minutes before your appointment. 2. Tardiness: The cutoff is your appointment time. If you have an appointment scheduled for 10:00 AM and you arrive at 10:01, you will be required to reschedule your appointment.  3. Plan ahead: Always assume that you will encounter traffic on your way in. Plan for it. If you are dependent on a driver, make sure they understand these rules and the need to arrive early. 4. Other appointments and responsibilities: Avoid scheduling any other appointments before or after your pain clinic appointments.  5. Be prepared: Write down everything that you need to discuss with your healthcare provider and give this information to the admitting nurse. Write down the medications that you will need refilled. Bring your pills and bottles (even the empty ones), to all of your appointments, except for those where a  procedure is scheduled. 6. No children or pets: Find someone to take care of them. It is not appropriate to bring them in. 7. Scheduling changes: We request "advanced notification" of any changes or cancellations. 8. Advanced notification: Defined as a time period of more than 24 hours prior to the originally scheduled appointment. This allows for the appointment to be offered to other patients. 9. Rescheduling: When a visit is rescheduled, it will require the cancellation of the original appointment. For this reason they both fall within the category of "Cancellations".  10. Cancellations: They require advanced notification. Any cancellation less than 24 hours before the  appointment will be recorded as a "No Show". 11. No Show: Defined as an unkept appointment where the patient failed to notify or declare to the practice their intention or inability to keep the appointment.  Corrective process for repeat offenders:  1. Tardiness: Three (3) episodes of rescheduling due to late arrivals will be recorded as one (1) "No Show". 2. Cancellation or reschedule: Three (3) cancellations or rescheduling will be recorded as one (1) "No Show". 3. "No Shows": Three (3) "No Shows" within a 12 month period will result in discharge from the practice.  ____________________________________________________________________________________________  ____________________________________________________________________________________________  Pain Scale  Introduction: The pain score used by this practice is the Verbal Numerical Rating Scale (VNRS-11). This is an 11-point scale. It is for adults and children 10 years or older. There are significant differences in how the pain score is reported, used, and applied. Forget everything you learned in the past and learn this scoring system.  General Information: The scale should reflect your current level of pain. Unless you are specifically asked for the level of your  worst pain, or  your average pain. If you are asked for one of these two, then it should be understood that it is over the past 24 hours.  Basic Activities of Daily Living (ADL): Personal hygiene, dressing, eating, transferring, and using restroom.  Instructions: Most patients tend to report their level of pain as a combination of two factors, their physical pain and their psychosocial pain. This last one is also known as "suffering" and it is reflection of how physical pain affects you socially and psychologically. From now on, report them separately. From this point on, when asked to report your pain level, report only your physical pain. Use the following table for reference.  Pain Clinic Pain Levels (0-5/10)  Pain Level Score  Description  No Pain 0   Mild pain 1 Nagging, annoying, but does not interfere with basic activities of daily living (ADL). Patients are able to eat, bathe, get dressed, toileting (being able to get on and off the toilet and perform personal hygiene functions), transfer (move in and out of bed or a chair without assistance), and maintain continence (able to control bladder and bowel functions). Blood pressure and heart rate are unaffected. A normal heart rate for a healthy adult ranges from 60 to 100 bpm (beats per minute).   Mild to moderate pain 2 Noticeable and distracting. Impossible to hide from other people. More frequent flare-ups. Still possible to adapt and function close to normal. It can be very annoying and may have occasional stronger flare-ups. With discipline, patients may get used to it and adapt.   Moderate pain 3 Interferes significantly with activities of daily living (ADL). It becomes difficult to feed, bathe, get dressed, get on and off the toilet or to perform personal hygiene functions. Difficult to get in and out of bed or a chair without assistance. Very distracting. With effort, it can be ignored when deeply involved in activities.   Moderately  severe pain 4 Impossible to ignore for more than a few minutes. With effort, patients may still be able to manage work or participate in some social activities. Very difficult to concentrate. Signs of autonomic nervous system discharge are evident: dilated pupils (mydriasis); mild sweating (diaphoresis); sleep interference. Heart rate becomes elevated (>115 bpm). Diastolic blood pressure (lower number) rises above 100 mmHg. Patients find relief in laying down and not moving.   Severe pain 5 Intense and extremely unpleasant. Associated with frowning face and frequent crying. Pain overwhelms the senses.  Ability to do any activity or maintain social relationships becomes significantly limited. Conversation becomes difficult. Pacing back and forth is common, as getting into a comfortable position is nearly impossible. Pain wakes you up from deep sleep. Physical signs will be obvious: pupillary dilation; increased sweating; goosebumps; brisk reflexes; cold, clammy hands and feet; nausea, vomiting or dry heaves; loss of appetite; significant sleep disturbance with inability to fall asleep or to remain asleep. When persistent, significant weight loss is observed due to the complete loss of appetite and sleep deprivation.  Blood pressure and heart rate becomes significantly elevated. Caution: If elevated blood pressure triggers a pounding headache associated with blurred vision, then the patient should immediately seek attention at an urgent or emergency care unit, as these may be signs of an impending stroke.    Emergency Department Pain Levels (6-10/10)  Emergency Room Pain 6 Severely limiting. Requires emergency care and should not be seen or managed at an outpatient pain management facility. Communication becomes difficult and requires great effort. Assistance to reach the emergency  department may be required. Facial flushing and profuse sweating along with potentially dangerous increases in heart rate and  blood pressure will be evident.   Distressing pain 7 Self-care is very difficult. Assistance is required to transport, or use restroom. Assistance to reach the emergency department will be required. Tasks requiring coordination, such as bathing and getting dressed become very difficult.   Disabling pain 8 Self-care is no longer possible. At this level, pain is disabling. The individual is unable to do even the most "basic" activities such as walking, eating, bathing, dressing, transferring to a bed, or toileting. Fine motor skills are lost. It is difficult to think clearly.   Incapacitating pain 9 Pain becomes incapacitating. Thought processing is no longer possible. Difficult to remember your own name. Control of movement and coordination are lost.   The worst pain imaginable 10 At this level, most patients pass out from pain. When this level is reached, collapse of the autonomic nervous system occurs, leading to a sudden drop in blood pressure and heart rate. This in turn results in a temporary and dramatic drop in blood flow to the brain, leading to a loss of consciousness. Fainting is one of the body's self defense mechanisms. Passing out puts the brain in a calmed state and causes it to shut down for a while, in order to begin the healing process.    Summary: 1. Refer to this scale when providing [place] with your pain level. 2. Be accurate and careful when reporting your pain level. This will help with your care. 3. Over-reporting your pain level will lead to loss of credibility. 4. Even a level of 1/10 means that there is pain and will be treated at our facility. 5. High, inaccurate reporting will be documented as "Symptom Exaggeration", leading to loss of credibility and suspicions of possible secondary gains such as obtaining more narcotics, or wanting to appear disabled, for fraudulent reasons. 6. Only pain levels of 5 or below will be seen at our facility. 7. Pain levels of 6 and above will  be sent to the Emergency Department and the appointment cancelled. ____________________________________________________________________________________________

## 2016-09-27 ENCOUNTER — Encounter: Payer: Self-pay | Admitting: Nurse Practitioner

## 2016-09-27 ENCOUNTER — Ambulatory Visit: Payer: PPO | Attending: Nurse Practitioner | Admitting: Nurse Practitioner

## 2016-09-27 VITALS — BP 154/86 | HR 88 | Temp 98.2°F | Resp 18 | Ht 68.0 in | Wt 150.0 lb

## 2016-09-27 DIAGNOSIS — R532 Functional quadriplegia: Secondary | ICD-10-CM

## 2016-09-27 DIAGNOSIS — S14109D Unspecified injury at unspecified level of cervical spinal cord, subsequent encounter: Secondary | ICD-10-CM | POA: Diagnosis not present

## 2016-09-27 DIAGNOSIS — M25511 Pain in right shoulder: Secondary | ICD-10-CM | POA: Diagnosis not present

## 2016-09-27 DIAGNOSIS — G8929 Other chronic pain: Secondary | ICD-10-CM | POA: Diagnosis not present

## 2016-09-27 DIAGNOSIS — G894 Chronic pain syndrome: Secondary | ICD-10-CM | POA: Diagnosis not present

## 2016-09-27 DIAGNOSIS — G904 Autonomic dysreflexia: Secondary | ICD-10-CM

## 2016-09-27 DIAGNOSIS — G950 Syringomyelia and syringobulbia: Secondary | ICD-10-CM | POA: Diagnosis not present

## 2016-09-27 DIAGNOSIS — M25512 Pain in left shoulder: Secondary | ICD-10-CM | POA: Diagnosis not present

## 2016-09-27 NOTE — Progress Notes (Signed)
Safety precautions to be maintained throughout the outpatient stay will include: orient to surroundings, keep bed in low position, maintain call bell within reach at all times, provide assistance with transfer out of bed and ambulation.  

## 2016-09-27 NOTE — Patient Instructions (Signed)
____________________________________________________________________________________________  Appointment Policy Summary  It is our goal and responsibility to provide the medical community with assistance in the evaluation and management of patients with chronic pain. Unfortunately our resources are limited. Because we do not have an unlimited amount of time, or available appointments, we are required to closely monitor and manage their use. The following rules exist to maximize their use:  Patient's responsibilities: 1. Punctuality: You are required to be physically present and registered in our facility at least 30 minutes before your appointment. 2. Tardiness: The cutoff is your appointment time. If you have an appointment scheduled for 10:00 AM and you arrive at 10:01, you will be required to reschedule your appointment.  3. Plan ahead: Always assume that you will encounter traffic on your way in. Plan for it. If you are dependent on a driver, make sure they understand these rules and the need to arrive early. 4. Other appointments and responsibilities: Avoid scheduling any other appointments before or after your pain clinic appointments.  5. Be prepared: Write down everything that you need to discuss with your healthcare provider and give this information to the admitting nurse. Write down the medications that you will need refilled. Bring your pills and bottles (even the empty ones), to all of your appointments, except for those where a procedure is scheduled. 6. No children or pets: Find someone to take care of them. It is not appropriate to bring them in. 7. Scheduling changes: We request "advanced notification" of any changes or cancellations. 8. Advanced notification: Defined as a time period of more than 24 hours prior to the originally scheduled appointment. This allows for the appointment to be offered to other patients. 9. Rescheduling: When a visit is rescheduled, it will require the  cancellation of the original appointment. For this reason they both fall within the category of "Cancellations".  10. Cancellations: They require advanced notification. Any cancellation less than 24 hours before the  appointment will be recorded as a "No Show". 11. No Show: Defined as an unkept appointment where the patient failed to notify or declare to the practice their intention or inability to keep the appointment.  Corrective process for repeat offenders:  1. Tardiness: Three (3) episodes of rescheduling due to late arrivals will be recorded as one (1) "No Show". 2. Cancellation or reschedule: Three (3) cancellations or rescheduling will be recorded as one (1) "No Show". 3. "No Shows": Three (3) "No Shows" within a 12 month period will result in discharge from the practice.  ____________________________________________________________________________________________  ____________________________________________________________________________________________  Pain Scale  Introduction: The pain score used by this practice is the Verbal Numerical Rating Scale (VNRS-11). This is an 11-point scale. It is for adults and children 10 years or older. There are significant differences in how the pain score is reported, used, and applied. Forget everything you learned in the past and learn this scoring system.  General Information: The scale should reflect your current level of pain. Unless you are specifically asked for the level of your worst pain, or your average pain. If you are asked for one of these two, then it should be understood that it is over the past 24 hours.  Basic Activities of Daily Living (ADL): Personal hygiene, dressing, eating, transferring, and using restroom.  Instructions: Most patients tend to report their level of pain as a combination of two factors, their physical pain and their psychosocial pain. This last one is also known as "suffering" and it is reflection of how  physical   pain affects you socially and psychologically. From now on, report them separately. From this point on, when asked to report your pain level, report only your physical pain. Use the following table for reference.  Pain Clinic Pain Levels (0-5/10)  Pain Level Score  Description  No Pain 0   Mild pain 1 Nagging, annoying, but does not interfere with basic activities of daily living (ADL). Patients are able to eat, bathe, get dressed, toileting (being able to get on and off the toilet and perform personal hygiene functions), transfer (move in and out of bed or a chair without assistance), and maintain continence (able to control bladder and bowel functions). Blood pressure and heart rate are unaffected. A normal heart rate for a healthy adult ranges from 60 to 100 bpm (beats per minute).   Mild to moderate pain 2 Noticeable and distracting. Impossible to hide from other people. More frequent flare-ups. Still possible to adapt and function close to normal. It can be very annoying and may have occasional stronger flare-ups. With discipline, patients may get used to it and adapt.   Moderate pain 3 Interferes significantly with activities of daily living (ADL). It becomes difficult to feed, bathe, get dressed, get on and off the toilet or to perform personal hygiene functions. Difficult to get in and out of bed or a chair without assistance. Very distracting. With effort, it can be ignored when deeply involved in activities.   Moderately severe pain 4 Impossible to ignore for more than a few minutes. With effort, patients may still be able to manage work or participate in some social activities. Very difficult to concentrate. Signs of autonomic nervous system discharge are evident: dilated pupils (mydriasis); mild sweating (diaphoresis); sleep interference. Heart rate becomes elevated (>115 bpm). Diastolic blood pressure (lower number) rises above 100 mmHg. Patients find relief in laying down and not  moving.   Severe pain 5 Intense and extremely unpleasant. Associated with frowning face and frequent crying. Pain overwhelms the senses.  Ability to do any activity or maintain social relationships becomes significantly limited. Conversation becomes difficult. Pacing back and forth is common, as getting into a comfortable position is nearly impossible. Pain wakes you up from deep sleep. Physical signs will be obvious: pupillary dilation; increased sweating; goosebumps; brisk reflexes; cold, clammy hands and feet; nausea, vomiting or dry heaves; loss of appetite; significant sleep disturbance with inability to fall asleep or to remain asleep. When persistent, significant weight loss is observed due to the complete loss of appetite and sleep deprivation.  Blood pressure and heart rate becomes significantly elevated. Caution: If elevated blood pressure triggers a pounding headache associated with blurred vision, then the patient should immediately seek attention at an urgent or emergency care unit, as these may be signs of an impending stroke.    Emergency Department Pain Levels (6-10/10)  Emergency Room Pain 6 Severely limiting. Requires emergency care and should not be seen or managed at an outpatient pain management facility. Communication becomes difficult and requires great effort. Assistance to reach the emergency department may be required. Facial flushing and profuse sweating along with potentially dangerous increases in heart rate and blood pressure will be evident.   Distressing pain 7 Self-care is very difficult. Assistance is required to transport, or use restroom. Assistance to reach the emergency department will be required. Tasks requiring coordination, such as bathing and getting dressed become very difficult.   Disabling pain 8 Self-care is no longer possible. At this level, pain is disabling. The individual   is unable to do even the most "basic" activities such as walking, eating, bathing,  dressing, transferring to a bed, or toileting. Fine motor skills are lost. It is difficult to think clearly.   Incapacitating pain 9 Pain becomes incapacitating. Thought processing is no longer possible. Difficult to remember your own name. Control of movement and coordination are lost.   The worst pain imaginable 10 At this level, most patients pass out from pain. When this level is reached, collapse of the autonomic nervous system occurs, leading to a sudden drop in blood pressure and heart rate. This in turn results in a temporary and dramatic drop in blood flow to the brain, leading to a loss of consciousness. Fainting is one of the body's self defense mechanisms. Passing out puts the brain in a calmed state and causes it to shut down for a while, in order to begin the healing process.    Summary: 1. Refer to this scale when providing us with your pain level. 2. Be accurate and careful when reporting your pain level. This will help with your care. 3. Over-reporting your pain level will lead to loss of credibility. 4. Even a level of 1/10 means that there is pain and will be treated at our facility. 5. High, inaccurate reporting will be documented as "Symptom Exaggeration", leading to loss of credibility and suspicions of possible secondary gains such as obtaining more narcotics, or wanting to appear disabled, for fraudulent reasons. 6. Only pain levels of 5 or below will be seen at our facility. 7. Pain levels of 6 and above will be sent to the Emergency Department and the appointment cancelled. ____________________________________________________________________________________________   

## 2016-09-28 ENCOUNTER — Telehealth: Payer: Self-pay | Admitting: *Deleted

## 2016-09-28 NOTE — Telephone Encounter (Signed)
Attempted to call patient to inform him of 2 local physicians that manage Baclofen pumps. Message left, instructing patient to return our call.

## 2016-10-02 DIAGNOSIS — M25519 Pain in unspecified shoulder: Secondary | ICD-10-CM

## 2016-10-02 DIAGNOSIS — G8929 Other chronic pain: Secondary | ICD-10-CM | POA: Insufficient documentation

## 2016-10-02 DIAGNOSIS — G950 Syringomyelia and syringobulbia: Secondary | ICD-10-CM | POA: Insufficient documentation

## 2016-10-15 DIAGNOSIS — N319 Neuromuscular dysfunction of bladder, unspecified: Secondary | ICD-10-CM | POA: Diagnosis not present

## 2016-10-15 DIAGNOSIS — M7582 Other shoulder lesions, left shoulder: Secondary | ICD-10-CM | POA: Diagnosis not present

## 2016-10-15 DIAGNOSIS — R339 Retention of urine, unspecified: Secondary | ICD-10-CM | POA: Diagnosis not present

## 2016-10-15 DIAGNOSIS — G904 Autonomic dysreflexia: Secondary | ICD-10-CM | POA: Diagnosis not present

## 2016-10-15 DIAGNOSIS — M7581 Other shoulder lesions, right shoulder: Secondary | ICD-10-CM | POA: Diagnosis not present

## 2016-10-15 DIAGNOSIS — G8254 Quadriplegia, C5-C7 incomplete: Secondary | ICD-10-CM | POA: Diagnosis not present

## 2016-10-15 DIAGNOSIS — E349 Endocrine disorder, unspecified: Secondary | ICD-10-CM | POA: Diagnosis not present

## 2016-10-19 DIAGNOSIS — M7582 Other shoulder lesions, left shoulder: Secondary | ICD-10-CM | POA: Diagnosis not present

## 2016-10-19 DIAGNOSIS — M7581 Other shoulder lesions, right shoulder: Secondary | ICD-10-CM | POA: Diagnosis not present

## 2016-10-19 DIAGNOSIS — Z6822 Body mass index (BMI) 22.0-22.9, adult: Secondary | ICD-10-CM | POA: Diagnosis not present

## 2016-10-22 ENCOUNTER — Telehealth: Payer: Self-pay | Admitting: Pain Medicine

## 2016-10-22 NOTE — Telephone Encounter (Signed)
Mr. Jesse Whitney has seen 2 Neurosurgeons in the last 4 months. He is not eligible for any surgery on his spine. [place]. He is willing to see psychologist for evaluation. Please reconsider him as patient for our clinic, per patient. Dr. Anna Genreonroy has never had a problem with his medications he is just unable to continue writing them with new laws. Patient would like to speak with Phys. Again. Maybe Dr. Cherylann RatelLateef would consider seeing this patient.

## 2016-10-22 NOTE — Telephone Encounter (Signed)
Talked with Crystal and she stated that he is a high risk and that no one here in the clinic will see him. [facility]

## 2016-11-20 DIAGNOSIS — G8254 Quadriplegia, C5-C7 incomplete: Secondary | ICD-10-CM | POA: Diagnosis not present

## 2016-11-20 DIAGNOSIS — E349 Endocrine disorder, unspecified: Secondary | ICD-10-CM | POA: Diagnosis not present

## 2016-11-20 DIAGNOSIS — I10 Essential (primary) hypertension: Secondary | ICD-10-CM | POA: Diagnosis not present

## 2016-11-27 ENCOUNTER — Ambulatory Visit: Payer: Self-pay | Admitting: Licensed Clinical Social Worker

## 2016-12-06 ENCOUNTER — Ambulatory Visit (INDEPENDENT_AMBULATORY_CARE_PROVIDER_SITE_OTHER): Payer: PPO | Admitting: Licensed Clinical Social Worker

## 2016-12-06 DIAGNOSIS — Z719 Counseling, unspecified: Secondary | ICD-10-CM | POA: Diagnosis not present

## 2016-12-07 DIAGNOSIS — R339 Retention of urine, unspecified: Secondary | ICD-10-CM | POA: Diagnosis not present

## 2016-12-07 DIAGNOSIS — N319 Neuromuscular dysfunction of bladder, unspecified: Secondary | ICD-10-CM | POA: Diagnosis not present
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
## 2016-12-27 DIAGNOSIS — E349 Endocrine disorder, unspecified: Secondary | ICD-10-CM | POA: Diagnosis not present

## 2016-12-27 DIAGNOSIS — Z23 Encounter for immunization: Secondary | ICD-10-CM | POA: Diagnosis not present

## 2016-12-27 DIAGNOSIS — G904 Autonomic dysreflexia: Secondary | ICD-10-CM | POA: Diagnosis not present

## 2016-12-27 DIAGNOSIS — G8254 Quadriplegia, C5-C7 incomplete: Secondary | ICD-10-CM | POA: Diagnosis not present

## 2017-01-03 DIAGNOSIS — M791 Myalgia, unspecified site: Secondary | ICD-10-CM | POA: Diagnosis not present

## 2017-01-03 DIAGNOSIS — Z6822 Body mass index (BMI) 22.0-22.9, adult: Secondary | ICD-10-CM | POA: Diagnosis not present

## 2017-01-03 DIAGNOSIS — G8254 Quadriplegia, C5-C7 incomplete: Secondary | ICD-10-CM | POA: Diagnosis not present

## 2017-01-10 DIAGNOSIS — N319 Neuromuscular dysfunction of bladder, unspecified: Secondary | ICD-10-CM | POA: Diagnosis not present

## 2017-01-10 DIAGNOSIS — R339 Retention of urine, unspecified: Secondary | ICD-10-CM | POA: Diagnosis not present

## 2017-01-14 DIAGNOSIS — N310 Uninhibited neuropathic bladder, not elsewhere classified: Secondary | ICD-10-CM | POA: Diagnosis not present

## 2017-01-14 DIAGNOSIS — R102 Pelvic and perineal pain: Secondary | ICD-10-CM | POA: Diagnosis not present

## 2017-01-25 DIAGNOSIS — Z6822 Body mass index (BMI) 22.0-22.9, adult: Secondary | ICD-10-CM | POA: Diagnosis not present

## 2017-01-25 DIAGNOSIS — M545 Low back pain: Secondary | ICD-10-CM | POA: Diagnosis not present

## 2017-01-25 DIAGNOSIS — G510 Bell's palsy: Secondary | ICD-10-CM | POA: Diagnosis not present

## 2017-01-25 DIAGNOSIS — G8254 Quadriplegia, C5-C7 incomplete: Secondary | ICD-10-CM | POA: Diagnosis not present

## 2017-01-25 DIAGNOSIS — E349 Endocrine disorder, unspecified: Secondary | ICD-10-CM | POA: Diagnosis not present

## 2017-01-28 DIAGNOSIS — M25551 Pain in right hip: Secondary | ICD-10-CM | POA: Diagnosis not present

## 2017-02-05 DIAGNOSIS — G510 Bell's palsy: Secondary | ICD-10-CM | POA: Diagnosis not present

## 2017-02-05 DIAGNOSIS — I10 Essential (primary) hypertension: Secondary | ICD-10-CM | POA: Diagnosis not present

## 2017-02-05 DIAGNOSIS — M7581 Other shoulder lesions, right shoulder: Secondary | ICD-10-CM | POA: Diagnosis not present

## 2017-02-12 DIAGNOSIS — N319 Neuromuscular dysfunction of bladder, unspecified: Secondary | ICD-10-CM | POA: Diagnosis not present

## 2017-02-12 DIAGNOSIS — R339 Retention of urine, unspecified: Secondary | ICD-10-CM | POA: Diagnosis not present

## 2017-02-20 DIAGNOSIS — Z79899 Other long term (current) drug therapy: Secondary | ICD-10-CM | POA: Diagnosis not present

## 2017-02-20 DIAGNOSIS — N401 Enlarged prostate with lower urinary tract symptoms: Secondary | ICD-10-CM | POA: Diagnosis not present

## 2017-02-20 DIAGNOSIS — N310 Uninhibited neuropathic bladder, not elsewhere classified: Secondary | ICD-10-CM | POA: Diagnosis not present

## 2017-02-20 DIAGNOSIS — E291 Testicular hypofunction: Secondary | ICD-10-CM | POA: Diagnosis not present

## 2017-02-24 ENCOUNTER — Emergency Department
Admission: EM | Admit: 2017-02-24 | Discharge: 2017-02-24 | Disposition: A | Discharge status: Home / Self Care | Payer: PPO | Attending: Emergency Medicine | Admitting: Emergency Medicine

## 2017-02-24 DIAGNOSIS — G8929 Other chronic pain: Secondary | ICD-10-CM | POA: Diagnosis not present

## 2017-02-24 DIAGNOSIS — Z87891 Personal history of nicotine dependence: Secondary | ICD-10-CM | POA: Diagnosis not present

## 2017-02-24 DIAGNOSIS — I10 Essential (primary) hypertension: Secondary | ICD-10-CM | POA: Diagnosis not present

## 2017-02-24 DIAGNOSIS — Z79899 Other long term (current) drug therapy: Secondary | ICD-10-CM | POA: Insufficient documentation

## 2017-02-24 DIAGNOSIS — Z760 Encounter for issue of repeat prescription: Secondary | ICD-10-CM | POA: Diagnosis not present

## 2017-02-24 MED ORDER — FENTANYL 100 MCG/HR TD PT72: 100.0000 ug | MEDICATED_PATCH | TRANSDERMAL | 0 refills | Status: DC

## 2017-02-24 NOTE — ED Triage Notes (Signed)
Patient is a quadraplegic. States he ran out of his fentanyl patches. Needs refill

## 2017-02-24 NOTE — Discharge Instructions (Signed)
Call and make an appointment with your primary care provider for continued pain medication. Take medication only as directed.

## 2017-02-24 NOTE — ED Notes (Signed)
See triage note. 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 BUE have full ROM. Pt states he ran out of fentanyl patches and that he typically receives 3 boxes at a time. Hx chronic pain, being treated with fentanyl patch and oxycodone PO by PCP.
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
## 2017-02-24 NOTE — ED Notes (Signed)

## 2017-02-25 DIAGNOSIS — N390 Urinary tract infection, site not specified: Secondary | ICD-10-CM | POA: Diagnosis not present

## 2017-02-25 DIAGNOSIS — E349 Endocrine disorder, unspecified: Secondary | ICD-10-CM | POA: Diagnosis not present

## 2017-02-25 DIAGNOSIS — M25511 Pain in right shoulder: Secondary | ICD-10-CM | POA: Diagnosis not present

## 2017-02-25 DIAGNOSIS — E785 Hyperlipidemia, unspecified: Secondary | ICD-10-CM | POA: Diagnosis not present

## 2017-02-25 DIAGNOSIS — Z6822 Body mass index (BMI) 22.0-22.9, adult: Secondary | ICD-10-CM | POA: Diagnosis not present

## 2017-02-25 DIAGNOSIS — G904 Autonomic dysreflexia: Secondary | ICD-10-CM | POA: Diagnosis not present

## 2017-02-25 DIAGNOSIS — Z136 Encounter for screening for cardiovascular disorders: Secondary | ICD-10-CM | POA: Diagnosis not present

## 2017-02-25 DIAGNOSIS — Z0000 Encounter for general adult medical examination without abnormal findings: Secondary | ICD-10-CM | POA: Diagnosis not present

## 2017-02-25 DIAGNOSIS — G8254 Quadriplegia, C5-C7 incomplete: Secondary | ICD-10-CM | POA: Diagnosis not present

## 2017-02-25 DIAGNOSIS — Z79899 Other long term (current) drug therapy: Secondary | ICD-10-CM | POA: Diagnosis not present

## 2017-02-25 NOTE — Patient Outreach (Signed)
Outreach patient after ED [facility].  I spoke with patient and verified PCP and patient has a appointment with PCP today.  [facility] but could not talk at the time he had to get to his Doctors appointment.  I gave him my name number and he will call back.  Successful letter, magnet and know before you go mailed on today.

## 2017-03-04 DIAGNOSIS — Z79899 Other long term (current) drug therapy: Secondary | ICD-10-CM | POA: Diagnosis not present

## 2017-03-15 DIAGNOSIS — N319 Neuromuscular dysfunction of bladder, unspecified: Secondary | ICD-10-CM | POA: Diagnosis not present

## 2017-03-15 DIAGNOSIS — R339 Retention of urine, unspecified: Secondary | ICD-10-CM | POA: Diagnosis not present

## 2017-03-18 DIAGNOSIS — S43421A Sprain of right rotator cuff capsule, initial encounter: Secondary | ICD-10-CM | POA: Diagnosis not present

## 2017-04-03 DIAGNOSIS — G904 Autonomic dysreflexia: Secondary | ICD-10-CM | POA: Diagnosis not present

## 2017-04-03 DIAGNOSIS — Z125 Encounter for screening for malignant neoplasm of prostate: Secondary | ICD-10-CM | POA: Diagnosis not present

## 2017-04-03 DIAGNOSIS — G8254 Quadriplegia, C5-C7 incomplete: Secondary | ICD-10-CM | POA: Diagnosis not present

## 2017-04-03 DIAGNOSIS — E782 Mixed hyperlipidemia: Secondary | ICD-10-CM | POA: Diagnosis not present

## 2017-04-03 DIAGNOSIS — Z79899 Other long term (current) drug therapy: Secondary | ICD-10-CM | POA: Diagnosis not present

## 2017-04-03 DIAGNOSIS — Z6822 Body mass index (BMI) 22.0-22.9, adult: Secondary | ICD-10-CM | POA: Diagnosis not present

## 2017-04-03 DIAGNOSIS — E349 Endocrine disorder, unspecified: Secondary | ICD-10-CM | POA: Diagnosis not present

## 2017-04-03 DIAGNOSIS — M25511 Pain in right shoulder: Secondary | ICD-10-CM | POA: Diagnosis not present

## 2017-04-11 DIAGNOSIS — G950 Syringomyelia and syringobulbia: Secondary | ICD-10-CM | POA: Diagnosis not present

## 2017-04-11 DIAGNOSIS — M25511 Pain in right shoulder: Secondary | ICD-10-CM | POA: Diagnosis not present

## 2017-04-11 DIAGNOSIS — G8929 Other chronic pain: Secondary | ICD-10-CM | POA: Diagnosis not present

## 2017-05-07 DIAGNOSIS — E349 Endocrine disorder, unspecified: Secondary | ICD-10-CM | POA: Diagnosis not present

## 2017-05-07 DIAGNOSIS — I10 Essential (primary) hypertension: Secondary | ICD-10-CM | POA: Diagnosis not present

## 2017-05-07 DIAGNOSIS — G904 Autonomic dysreflexia: Secondary | ICD-10-CM | POA: Diagnosis not present

## 2017-05-07 DIAGNOSIS — G8254 Quadriplegia, C5-C7 incomplete: Secondary | ICD-10-CM | POA: Diagnosis not present

## 2017-05-17 DIAGNOSIS — R339 Retention of urine, unspecified: Secondary | ICD-10-CM | POA: Diagnosis not present

## 2017-05-17 DIAGNOSIS — N319 Neuromuscular dysfunction of bladder, unspecified: Secondary | ICD-10-CM | POA: Diagnosis not present

## 2017-06-04 DIAGNOSIS — E349 Endocrine disorder, unspecified: Secondary | ICD-10-CM | POA: Diagnosis not present

## 2017-06-04 DIAGNOSIS — G8254 Quadriplegia, C5-C7 incomplete: Secondary | ICD-10-CM | POA: Diagnosis not present

## 2017-06-10 DIAGNOSIS — M25511 Pain in right shoulder: Secondary | ICD-10-CM | POA: Diagnosis not present

## 2017-06-10 DIAGNOSIS — G8929 Other chronic pain: Secondary | ICD-10-CM | POA: Diagnosis not present

## 2017-06-10 DIAGNOSIS — G950 Syringomyelia and syringobulbia: Secondary | ICD-10-CM | POA: Diagnosis not present

## 2017-06-26 DIAGNOSIS — G8254 Quadriplegia, C5-C7 incomplete: Secondary | ICD-10-CM | POA: Diagnosis not present

## 2017-06-26 DIAGNOSIS — Z6822 Body mass index (BMI) 22.0-22.9, adult: Secondary | ICD-10-CM | POA: Diagnosis not present

## 2017-07-05 DIAGNOSIS — Z6822 Body mass index (BMI) 22.0-22.9, adult: Secondary | ICD-10-CM | POA: Diagnosis not present

## 2017-07-05 DIAGNOSIS — G8254 Quadriplegia, C5-C7 incomplete: Secondary | ICD-10-CM | POA: Diagnosis not present

## 2017-07-05 DIAGNOSIS — E349 Endocrine disorder, unspecified: Secondary | ICD-10-CM | POA: Diagnosis not present

## 2017-07-05 DIAGNOSIS — Z79899 Other long term (current) drug therapy: Secondary | ICD-10-CM | POA: Diagnosis not present

## 2017-07-17 DIAGNOSIS — N310 Uninhibited neuropathic bladder, not elsewhere classified: Secondary | ICD-10-CM | POA: Diagnosis not present

## 2017-07-17 DIAGNOSIS — N5201 Erectile dysfunction due to arterial insufficiency: Secondary | ICD-10-CM | POA: Diagnosis not present

## 2017-07-17 DIAGNOSIS — E291 Testicular hypofunction: Secondary | ICD-10-CM | POA: Diagnosis not present

## 2017-07-18 DIAGNOSIS — R339 Retention of urine, unspecified: Secondary | ICD-10-CM | POA: Diagnosis not present

## 2017-07-18 DIAGNOSIS — N319 Neuromuscular dysfunction of bladder, unspecified: Secondary | ICD-10-CM | POA: Diagnosis not present

## 2017-08-07 DIAGNOSIS — G8254 Quadriplegia, C5-C7 incomplete: Secondary | ICD-10-CM | POA: Diagnosis not present

## 2017-08-07 DIAGNOSIS — Z6822 Body mass index (BMI) 22.0-22.9, adult: Secondary | ICD-10-CM | POA: Diagnosis not present

## 2017-08-07 DIAGNOSIS — G904 Autonomic dysreflexia: Secondary | ICD-10-CM | POA: Diagnosis not present

## 2017-08-07 DIAGNOSIS — Z79899 Other long term (current) drug therapy: Secondary | ICD-10-CM | POA: Diagnosis not present

## 2017-08-07 DIAGNOSIS — E349 Endocrine disorder, unspecified: Secondary | ICD-10-CM | POA: Diagnosis not present

## 2017-08-16 DIAGNOSIS — M7582 Other shoulder lesions, left shoulder: Secondary | ICD-10-CM | POA: Diagnosis not present

## 2017-08-16 DIAGNOSIS — M7581 Other shoulder lesions, right shoulder: Secondary | ICD-10-CM | POA: Diagnosis not present

## 2017-08-19 DIAGNOSIS — N310 Uninhibited neuropathic bladder, not elsewhere classified: Secondary | ICD-10-CM | POA: Diagnosis not present

## 2017-08-19 DIAGNOSIS — E291 Testicular hypofunction: Secondary | ICD-10-CM | POA: Diagnosis not present

## 2017-08-19 DIAGNOSIS — N390 Urinary tract infection, site not specified: Secondary | ICD-10-CM | POA: Diagnosis not present

## 2017-08-19 DIAGNOSIS — M310 Hypersensitivity angiitis: Secondary | ICD-10-CM | POA: Diagnosis not present

## 2017-08-28 DIAGNOSIS — R339 Retention of urine, unspecified: Secondary | ICD-10-CM | POA: Diagnosis not present

## 2017-08-28 DIAGNOSIS — N319 Neuromuscular dysfunction of bladder, unspecified: Secondary | ICD-10-CM | POA: Diagnosis not present

## 2017-09-10 DIAGNOSIS — E349 Endocrine disorder, unspecified: Secondary | ICD-10-CM | POA: Diagnosis not present

## 2017-09-10 DIAGNOSIS — G8254 Quadriplegia, C5-C7 incomplete: Secondary | ICD-10-CM | POA: Diagnosis not present

## 2017-09-10 DIAGNOSIS — I10 Essential (primary) hypertension: Secondary | ICD-10-CM | POA: Diagnosis not present

## 2017-09-10 DIAGNOSIS — G950 Syringomyelia and syringobulbia: Secondary | ICD-10-CM | POA: Diagnosis not present

## 2017-09-10 DIAGNOSIS — Z6822 Body mass index (BMI) 22.0-22.9, adult: Secondary | ICD-10-CM | POA: Diagnosis not present

## 2017-09-10 DIAGNOSIS — G904 Autonomic dysreflexia: Secondary | ICD-10-CM | POA: Diagnosis not present

## 2017-09-25 DIAGNOSIS — N319 Neuromuscular dysfunction of bladder, unspecified: Secondary | ICD-10-CM | POA: Diagnosis not present

## 2017-09-25 DIAGNOSIS — R339 Retention of urine, unspecified: Secondary | ICD-10-CM | POA: Diagnosis not present

## 2017-10-23 DIAGNOSIS — E349 Endocrine disorder, unspecified: Secondary | ICD-10-CM | POA: Diagnosis not present

## 2017-10-28 DIAGNOSIS — N319 Neuromuscular dysfunction of bladder, unspecified: Secondary | ICD-10-CM | POA: Diagnosis not present

## 2017-10-28 DIAGNOSIS — R339 Retention of urine, unspecified: Secondary | ICD-10-CM | POA: Diagnosis not present

## 2017-10-31 DIAGNOSIS — G8254 Quadriplegia, C5-C7 incomplete: Secondary | ICD-10-CM | POA: Diagnosis not present

## 2017-10-31 DIAGNOSIS — M545 Low back pain: Secondary | ICD-10-CM | POA: Diagnosis not present

## 2017-10-31 DIAGNOSIS — E349 Endocrine disorder, unspecified: Secondary | ICD-10-CM | POA: Diagnosis not present

## 2017-10-31 DIAGNOSIS — R5383 Other fatigue: Secondary | ICD-10-CM | POA: Diagnosis not present

## 2017-10-31 DIAGNOSIS — G904 Autonomic dysreflexia: Secondary | ICD-10-CM | POA: Diagnosis not present

## 2017-10-31 DIAGNOSIS — G950 Syringomyelia and syringobulbia: Secondary | ICD-10-CM | POA: Diagnosis not present

## 2017-10-31 DIAGNOSIS — Z6822 Body mass index (BMI) 22.0-22.9, adult: Secondary | ICD-10-CM | POA: Diagnosis not present

## 2017-11-06 DIAGNOSIS — N319 Neuromuscular dysfunction of bladder, unspecified: Secondary | ICD-10-CM | POA: Diagnosis not present

## 2017-11-06 DIAGNOSIS — R300 Dysuria: Secondary | ICD-10-CM | POA: Diagnosis not present

## 2017-11-06 DIAGNOSIS — G8220 Paraplegia, unspecified: Secondary | ICD-10-CM | POA: Diagnosis not present

## 2017-11-21 DIAGNOSIS — Z79899 Other long term (current) drug therapy: Secondary | ICD-10-CM | POA: Diagnosis not present

## 2017-11-21 DIAGNOSIS — M7581 Other shoulder lesions, right shoulder: Secondary | ICD-10-CM | POA: Diagnosis not present

## 2017-12-03 ENCOUNTER — Other Ambulatory Visit: Payer: Self-pay

## 2017-12-03 NOTE — Patient Outreach (Signed)
[facility]) Care Management  12/03/2017  Jesse Whitney 11/19/1955 [id number]   Telephone Screen  Referral Date: 12/03/17 Referral Source: HTA Appstatus/Concierge Referral Reason: " member is paraplegic and is looking for resources for assistance in the home with ADLs-member can still drive and get around to stores and appts so HH is not best fit" Insurance: HTA   Outreach attempt # 1 to patient. No answer at present. RN CM left HIPAA compliant voicemail message along with contact info.       Plan: RN CM will make outreach attempt to patient within 3-4 business days. RN CM will send unsuccessful outreach letter to patient.    [name], RN,BSN,CCM [facility] Care Management Telephonic Care Management Coordinator Direct Phone: [phone] Toll Free: [phone] Fax: [phone]

## 2017-12-04 DIAGNOSIS — E349 Endocrine disorder, unspecified: Secondary | ICD-10-CM | POA: Diagnosis not present

## 2017-12-04 DIAGNOSIS — G8254 Quadriplegia, C5-C7 incomplete: Secondary | ICD-10-CM | POA: Diagnosis not present

## 2017-12-04 DIAGNOSIS — G950 Syringomyelia and syringobulbia: Secondary | ICD-10-CM | POA: Diagnosis not present

## 2017-12-04 DIAGNOSIS — K6289 Other specified diseases of anus and rectum: Secondary | ICD-10-CM | POA: Diagnosis not present

## 2017-12-04 DIAGNOSIS — G904 Autonomic dysreflexia: Secondary | ICD-10-CM | POA: Diagnosis not present

## 2017-12-04 DIAGNOSIS — M545 Low back pain: Secondary | ICD-10-CM | POA: Diagnosis not present

## 2017-12-05 ENCOUNTER — Other Ambulatory Visit: Payer: PPO

## 2017-12-05 NOTE — Patient Outreach (Signed)
[facility]) Care Management  12/05/2017  Jesse Whitney 05-13-1955 [id number]   Telephone Screen  Referral Date: 12/03/17 Referral Source: HTA Appstatus/Concierge Referral Reason: " member is paraplegic and is looking for resources for assistance in the home with ADLs-member can still drive and get around to stores and appts so HH is not best fit" Insurance: HTA     Outreach attempt #2 to patient. No answer at present.      Plan: RN CM will make outreach attempt to patient within 3-4 business days.   [name], RN,BSN,CCM [facility] Care Management Telephonic Care Management Coordinator Direct Phone: [phone] Toll Free: [phone] Fax: [phone]

## 2017-12-06 ENCOUNTER — Ambulatory Visit: Payer: PPO

## 2017-12-09 ENCOUNTER — Other Ambulatory Visit: Payer: Self-pay

## 2017-12-09 DIAGNOSIS — R339 Retention of urine, unspecified: Secondary | ICD-10-CM | POA: Diagnosis not present

## 2017-12-09 DIAGNOSIS — N319 Neuromuscular dysfunction of bladder, unspecified: Secondary | ICD-10-CM | POA: Diagnosis not present

## 2017-12-09 NOTE — Patient Outreach (Signed)
[facility] Care Management  12/09/2017  Jesse Whitney 07/28/1955 [id number]   Telephone Screen  Referral Date:12/03/17 Referral Source:HTA Appstatus/Concierge Referral Reason:" member is paraplegic and is looking for resources for assistance in the home with ADLs-member can still drive and get around to stores and appts so HH is not best fit" Insurance:HTA     Outreach attempt #3 to patient. A male answered the phone and reported patient was not available at present. RN CM left contact info with male and requested that patient return call to RN CM.     Plan: RN CM will close case if no response from letter mailed to patient.  [name], RN,BSN,CCM [facility] Care Management Telephonic Care Management Coordinator Direct Phone: [phone] Toll Free: [phone] Fax: [phone]

## 2017-12-09 NOTE — Patient Outreach (Signed)
[facility]) Care Management  12/09/2017  ZHI CHAPLA 1955-07-02 [id number]   Telephone Screen  Referral Date:12/03/17 Referral Source:HTA Appstatus/Concierge Referral Reason:" member is paraplegic and is looking for resources for assistance in the home with ADLs-member can still drive and get around to stores and appts so HH is not best fit" Insurance:HTA   Incoming call from patient. Spoke with patient regarding referral. Patient states that he "got everything he needed when he spoke with HTA conceirge." He vocies that he had seen an advertisement commerical regarding in home support and care. He voices that he is a paraplegic and has been so for about 39 years. He shares that he is still independent with ADLs/IADLs. He has supportive spouse as well. He repeatedly stated that he "did not need any services at this time." He voices that he just wanted to speak with health plan to see what services would be covered and not covered if future needs arise. RN CM discussed [facility] services. Patient appreciative to know that services available but denies any [facility] needs or concerns at this time. RN CM advised patient that [facility] brochure and contact info had been mailed to patient and advised him to call for any needs or concerns in the future. He voiced understanding and was appreciative of call.      Plan: RN CM will close case at this time.  [name], RN,BSN,CCM [facility] Care Management Telephonic Care Management Coordinator Direct Phone: [phone] Toll Free: [phone] Fax: [phone]

## 2017-12-10 ENCOUNTER — Ambulatory Visit: Payer: Self-pay

## 2017-12-12 DIAGNOSIS — N390 Urinary tract infection, site not specified: Secondary | ICD-10-CM | POA: Diagnosis not present

## 2017-12-17 ENCOUNTER — Encounter: Payer: Self-pay | Admitting: General Surgery

## 2017-12-17 ENCOUNTER — Ambulatory Visit: Payer: PPO | Admitting: General Surgery

## 2017-12-17 ENCOUNTER — Ambulatory Visit (INDEPENDENT_AMBULATORY_CARE_PROVIDER_SITE_OTHER): Payer: PPO | Admitting: General Surgery

## 2017-12-17 VITALS — BP 94/62 | HR 92 | Resp 12 | Ht 67.0 in | Wt 150.0 lb

## 2017-12-17 DIAGNOSIS — K6289 Other specified diseases of anus and rectum: Secondary | ICD-10-CM | POA: Diagnosis not present

## 2017-12-17 DIAGNOSIS — Z8719 Personal history of other diseases of the digestive system: Secondary | ICD-10-CM

## 2017-12-17 NOTE — Patient Instructions (Addendum)
Follow up as needed or if symptoms worsen. The patient is aware to call back for any questions or concerns. 
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
## 2018-01-03 ENCOUNTER — Other Ambulatory Visit: Payer: Self-pay

## 2018-01-03 NOTE — Patient Outreach (Signed)
[facility]) Care Management  01/03/2018  LAVEL RIEMAN 02-Aug-1955 [id number]   Telephone Screen  Referral Date: 01/03/18 Referral Source: HTA Concierge Referral Reason: "resources to assist with Rx gap" Insurance: HTA   Voicemail message received from patient. Return call placed to patient. Spoke with patient and discussed referral reason and source with patient. RN CM just spoke wit patient a few weeks and patient was screened and had no [facility] needs or concerns. He voiced that he was told he has about $150.00 more before he will be in the doughnut hole. Patient was looking to see if there was any assistance available to help pay for his pain meds-Fentanyl. Advised patient that it was against the law for [facility] to assist with paying for pain meds. He voices that he takes three daily meds and about five prn meds. He voices that his other meds are all tier 1 meds and only cost him a few dollars to refill and he is not having issues with that. His only issue was paying for the Fentanyl which has a $45 co pay. Patient understands the law and [facility] inability to assist with this matter. He voices no further RN CM needs or concerns at this time. He has [facility] info and is aware to call in the future for ay further needs or concerns. Patient voiced understanding and was appreciative of call.       Plan: RN CM will close case at this time.   [name], RN,BSN,CCM [facility] Care Management Telephonic Care Management Coordinator Direct Phone: [phone] Toll Free: [phone] Fax: [phone]

## 2018-01-03 NOTE — Patient Outreach (Signed)
[facility]) Care Management  01/03/2018  Jesse Whitney [birth year] [id number]   Telephone Screen  Referral Date: 01/03/18 Referral Source: HTA Concierge Referral Reason: "resources to assist with Rx gap" Insurance: HTA   Outreach attempt #1 to patient. No answer at present. RN CM left HIPAA compliant voicemail message along with contact info.     Plan: RN CM will make outreach attempt to patient within 3-4 business days. RN CM will send unsuccessful outreach letter to patient.   [name], RN,BSN,CCM [facility] Care Management Telephonic Care Management Coordinator Direct Phone: [phone] Toll Free: [phone] Fax: [phone]

## 2018-01-06 DIAGNOSIS — N319 Neuromuscular dysfunction of bladder, unspecified: Secondary | ICD-10-CM | POA: Diagnosis not present

## 2018-01-06 DIAGNOSIS — R339 Retention of urine, unspecified: Secondary | ICD-10-CM | POA: Diagnosis not present

## 2018-01-07 ENCOUNTER — Ambulatory Visit: Payer: PPO

## 2018-01-07 DIAGNOSIS — E349 Endocrine disorder, unspecified: Secondary | ICD-10-CM | POA: Diagnosis not present

## 2018-01-07 DIAGNOSIS — Z23 Encounter for immunization: Secondary | ICD-10-CM | POA: Diagnosis not present

## 2018-01-07 DIAGNOSIS — G8254 Quadriplegia, C5-C7 incomplete: Secondary | ICD-10-CM | POA: Diagnosis not present

## 2018-01-07 DIAGNOSIS — Z6822 Body mass index (BMI) 22.0-22.9, adult: Secondary | ICD-10-CM | POA: Diagnosis not present

## 2018-01-07 DIAGNOSIS — G950 Syringomyelia and syringobulbia: Secondary | ICD-10-CM | POA: Diagnosis not present

## 2018-01-07 DIAGNOSIS — M545 Low back pain: Secondary | ICD-10-CM | POA: Diagnosis not present

## 2018-01-07 DIAGNOSIS — G904 Autonomic dysreflexia: Secondary | ICD-10-CM | POA: Diagnosis not present
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
## 2018-01-15 ENCOUNTER — Encounter: Payer: Self-pay | Admitting: General Surgery

## 2018-01-15 NOTE — Progress Notes (Signed)
Records from 12/7/ 2006 colonoscopy completed by Dr. Renda Rolls reviewed. Study completed for chronic, intermittent rectal bleeding. Normal exam.  Patient will be contacted to consider screening exam: Cologuard or colonoscopy if no family history of colon cancer or polyps in first degree relatives, colonoscopy if positive family history.

## 2018-01-20 DIAGNOSIS — M25512 Pain in left shoulder: Secondary | ICD-10-CM | POA: Diagnosis not present

## 2018-01-20 DIAGNOSIS — Z6822 Body mass index (BMI) 22.0-22.9, adult: Secondary | ICD-10-CM | POA: Diagnosis not present

## 2018-01-20 DIAGNOSIS — G8254 Quadriplegia, C5-C7 incomplete: Secondary | ICD-10-CM | POA: Diagnosis not present

## 2018-01-30 ENCOUNTER — Telehealth: Payer: Self-pay

## 2018-01-30 NOTE — Telephone Encounter (Signed)
-----   Message from Sinda Du, LPN sent at 16/01/9603 10:42 AM EDT -----   ----- Message ----- From: Sinda Du, LPN Sent: 54/12/8117  11:29 AM EDT To: Earline Mayotte, MD  He says that he will be discussing this with his primary care provider.  ----- Message ----- From: Earline Mayotte, MD Sent: 01/15/2018   5:06 PM EDT To: Sinda Du, LPN  Please notify patient he is a candidate for screening colon exam, last recorded was 2006 with Dr. Renda Rolls. If no family history of colon cancer or polyps in a first degree relative (Mother, father, siblings) he can do Cologuard or have colonoscopy.  If + family history, he should consider colonoscopy.

## 2018-02-05 DIAGNOSIS — N310 Uninhibited neuropathic bladder, not elsewhere classified: Secondary | ICD-10-CM | POA: Diagnosis not present

## 2018-02-05 DIAGNOSIS — N319 Neuromuscular dysfunction of bladder, unspecified: Secondary | ICD-10-CM | POA: Diagnosis not present

## 2018-02-05 DIAGNOSIS — R339 Retention of urine, unspecified: Secondary | ICD-10-CM | POA: Diagnosis not present

## 2018-02-05 DIAGNOSIS — R350 Frequency of micturition: Secondary | ICD-10-CM | POA: Diagnosis not present

## 2018-02-05 DIAGNOSIS — G8220 Paraplegia, unspecified: Secondary | ICD-10-CM | POA: Diagnosis not present

## 2018-02-05 DIAGNOSIS — M310 Hypersensitivity angiitis: Secondary | ICD-10-CM | POA: Diagnosis not present

## 2018-02-20 DIAGNOSIS — M7581 Other shoulder lesions, right shoulder: Secondary | ICD-10-CM | POA: Diagnosis not present

## 2018-02-20 DIAGNOSIS — G950 Syringomyelia and syringobulbia: Secondary | ICD-10-CM | POA: Diagnosis not present

## 2018-02-20 DIAGNOSIS — Z79899 Other long term (current) drug therapy: Secondary | ICD-10-CM | POA: Diagnosis not present

## 2018-02-20 DIAGNOSIS — M2550 Pain in unspecified joint: Secondary | ICD-10-CM | POA: Diagnosis not present

## 2018-02-20 DIAGNOSIS — Z6822 Body mass index (BMI) 22.0-22.9, adult: Secondary | ICD-10-CM | POA: Diagnosis not present

## 2018-02-20 DIAGNOSIS — E349 Endocrine disorder, unspecified: Secondary | ICD-10-CM | POA: Diagnosis not present

## 2018-02-20 DIAGNOSIS — G904 Autonomic dysreflexia: Secondary | ICD-10-CM | POA: Diagnosis not present

## 2018-02-20 DIAGNOSIS — G8254 Quadriplegia, C5-C7 incomplete: Secondary | ICD-10-CM | POA: Diagnosis not present

## 2018-02-20 DIAGNOSIS — I10 Essential (primary) hypertension: Secondary | ICD-10-CM | POA: Diagnosis not present

## 2018-02-20 DIAGNOSIS — M545 Low back pain: Secondary | ICD-10-CM | POA: Diagnosis not present

## 2018-02-24 DIAGNOSIS — N390 Urinary tract infection, site not specified: Secondary | ICD-10-CM | POA: Diagnosis not present

## 2018-03-11 DIAGNOSIS — N319 Neuromuscular dysfunction of bladder, unspecified: Secondary | ICD-10-CM | POA: Diagnosis not present

## 2018-03-11 DIAGNOSIS — R339 Retention of urine, unspecified: Secondary | ICD-10-CM | POA: Diagnosis not present

## 2018-03-21 DIAGNOSIS — N319 Neuromuscular dysfunction of bladder, unspecified: Secondary | ICD-10-CM | POA: Diagnosis not present

## 2018-03-21 DIAGNOSIS — Z79899 Other long term (current) drug therapy: Secondary | ICD-10-CM | POA: Diagnosis not present

## 2018-03-21 DIAGNOSIS — I10 Essential (primary) hypertension: Secondary | ICD-10-CM | POA: Diagnosis not present

## 2018-03-21 DIAGNOSIS — L89159 Pressure ulcer of sacral region, unspecified stage: Secondary | ICD-10-CM | POA: Diagnosis not present

## 2018-03-21 DIAGNOSIS — M545 Low back pain: Secondary | ICD-10-CM | POA: Diagnosis not present

## 2018-03-21 DIAGNOSIS — G8254 Quadriplegia, C5-C7 incomplete: Secondary | ICD-10-CM | POA: Diagnosis not present

## 2018-03-21 DIAGNOSIS — G950 Syringomyelia and syringobulbia: Secondary | ICD-10-CM | POA: Diagnosis not present

## 2018-03-21 DIAGNOSIS — G904 Autonomic dysreflexia: Secondary | ICD-10-CM | POA: Diagnosis not present

## 2018-03-21 DIAGNOSIS — E349 Endocrine disorder, unspecified: Secondary | ICD-10-CM | POA: Diagnosis not present

## 2018-03-27 DIAGNOSIS — E349 Endocrine disorder, unspecified: Secondary | ICD-10-CM | POA: Diagnosis not present

## 2018-04-08 DIAGNOSIS — N390 Urinary tract infection, site not specified: Secondary | ICD-10-CM | POA: Diagnosis not present

## 2018-04-11 DIAGNOSIS — R339 Retention of urine, unspecified: Secondary | ICD-10-CM | POA: Diagnosis not present

## 2018-04-11 DIAGNOSIS — N319 Neuromuscular dysfunction of bladder, unspecified: Secondary | ICD-10-CM | POA: Diagnosis not present

## 2018-04-15 DIAGNOSIS — M545 Low back pain: Secondary | ICD-10-CM | POA: Diagnosis not present

## 2018-04-15 DIAGNOSIS — G950 Syringomyelia and syringobulbia: Secondary | ICD-10-CM | POA: Diagnosis not present

## 2018-04-15 DIAGNOSIS — G8254 Quadriplegia, C5-C7 incomplete: Secondary | ICD-10-CM | POA: Diagnosis not present

## 2018-04-15 DIAGNOSIS — E349 Endocrine disorder, unspecified: Secondary | ICD-10-CM | POA: Diagnosis not present

## 2018-04-15 DIAGNOSIS — Z6822 Body mass index (BMI) 22.0-22.9, adult: Secondary | ICD-10-CM | POA: Diagnosis not present

## 2018-04-15 DIAGNOSIS — I10 Essential (primary) hypertension: Secondary | ICD-10-CM | POA: Diagnosis not present

## 2018-04-15 DIAGNOSIS — G904 Autonomic dysreflexia: Secondary | ICD-10-CM | POA: Diagnosis not present

## 2018-04-28 ENCOUNTER — Telehealth: Payer: Self-pay | Admitting: Urology
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
## 2018-04-28 NOTE — Telephone Encounter (Signed)
Left pt mess to call 

## 2018-04-29 ENCOUNTER — Ambulatory Visit (INDEPENDENT_AMBULATORY_CARE_PROVIDER_SITE_OTHER): Payer: PPO | Admitting: Urology

## 2018-04-29 ENCOUNTER — Encounter: Payer: Self-pay | Admitting: Urology

## 2018-04-29 VITALS — BP 135/73 | HR 94 | Ht 68.0 in | Wt 150.0 lb

## 2018-04-29 DIAGNOSIS — N390 Urinary tract infection, site not specified: Secondary | ICD-10-CM | POA: Diagnosis not present

## 2018-04-29 DIAGNOSIS — N319 Neuromuscular dysfunction of bladder, unspecified: Secondary | ICD-10-CM | POA: Diagnosis not present

## 2018-04-30 ENCOUNTER — Encounter: Payer: Self-pay | Admitting: Urology
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
## 2018-05-06 ENCOUNTER — Ambulatory Visit: Payer: PPO | Attending: Physician Assistant

## 2018-05-06 DIAGNOSIS — G8254 Quadriplegia, C5-C7 incomplete: Secondary | ICD-10-CM

## 2018-05-06 NOTE — Therapy (Signed)
[facility] MAIN [facility] SERVICES [address] [place], [place], [address] Phone: [phone]   Fax:  [phone]  Patient Details  Name: Jesse Whitney MRN: [id number] Date of Birth: July 23, 1955 Referring Provider:  Lonie Peak, PA-C  Encounter Date: 05/06/2018   [facility] Rep No showed for eval   Precious Bard, PT, DPT   05/06/2018, 3:18 PM  [facility] MAIN [facility] SERVICES [address] [place], [place], [address] Phone: [phone]   Fax:  [phone]

## 2018-05-15 ENCOUNTER — Ambulatory Visit: Payer: PPO

## 2018-05-15 ENCOUNTER — Other Ambulatory Visit: Payer: Self-pay

## 2018-05-15 DIAGNOSIS — G8254 Quadriplegia, C5-C7 incomplete: Secondary | ICD-10-CM

## 2018-05-15 NOTE — Therapy (Signed)
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 support []support during tilt and recline []improve feeding   []improve respiration []placement of switches []safety  []accommodate ROM  []accommodate tone []improve visual orientation  [] Anterior chest strap [] Vest [] Shoulder retractors  []decrease forward movement of shoulder []accommodation of TLSO []decrease forward movement of trunk []decrease shoulder elevation []added abdominal support []alignment []assistance with shoulder control  []                                              Pelvic  Positioner []Belt []SubASIS bar []Dual Pull []stabilize tone []decrease falling out of chair/ **will not Decrease potential for sliding due to pelvic tilting []prevent excessive rotation []pad for protection over boney prominence []prominence comfort []special pull angle to control rotation []                                                 Upper ExtremitySupport  []L   [] R []Arm trough   []hand support [] tray       []full tray []swivel mount []decrease edema      []decrease subluxation   []control tone   []placement for AAC/Computer/EADL []decrease gravitational pull on shoulders []provide midline positioning []provide support to increase UE function []provide hand support in natural position []provide work surface   POWER WHEELCHAIR CONTROLS  []Proportional  []Non-Proportional Type                                      []  Left  []Right []provides access for controlling wheelchair   []lacks motor control to operate proportional drive control []unable to understand proportional controls  Actuator Control Module  []Single  []Multiple   []Allow the client to operate the power seat function(s) through the joystick control   []Safety Reset Switches []Used to change modes and stop the wheelchair when driving in latch mode    []Upgraded Electronics   []programming for accurate control []progressive Disease/changing condition []non-proportional drive control needed []Needed in order to operate power seat functions through joystick control   []Display box []Allows user to see in which mode and drive the wheelchair is set  []necessary for alternate controls    []Digital interface electronics []Allows w/c to operate when using alternative drive controls  []ASL Head Array []Allows client to operate wheelchair  through switches placed in tri-panel headrest  []Sip and puff with tubing kit []needed to operate sip and puff drive controls  []Upgraded tracking electronics []increase safety  when driving []correct tracking when on uneven surfaces  []Mount for switches or joystick []Attaches switches to w/c  []Swing away for access or transfers []midline for optimal placement []provides for consistent access  []Attendant controlled joystick plus mount []safety []long distance driving []operation of seat functions []compliance with transportation regulations []                                            Rear wheel placement/Axle adjustability []None []semi adjustable [x]fully adjustable  []improved UE access to wheels [x]improved stability [x]changing angle in space for improvement of postural stability []1-arm drive access []amputee pad placement []                               Wheel rims/ hand rims  []metal   [x]plastic coated []oblique projections           [x]vertical projections [x]Provide ability to propel manual wheelchair  [x] Increase self-propulsion with hand weakness/decreased grasp  Push handles []extended   []angle adjustable              [x]standard []caregiver access [x]caregiver assist []allows "hooking" to enable increased ability to perform ADLs or maintain balance  One armed device   []Lt   []Rt []enable propulsion of manual wheelchair with one arm   []                                           Brake/wheel lock extension [] Lt   [] Rt []increase indep in applying wheel locks   []Side guards []prevent clothing getting caught in wheel or becoming soiled [] prevent skin tears/abrasions  Battery:                                            []to power wheelchair  Other:                                                                                                                        The above equipment has a life- long use expectancy. Growth and changes in medical and/or functional conditions would be the exceptions. This is to certify that the therapist has no financial relationship with durable  medical provider or manufacturer. The therapist will not receive remuneration of any kind for the equipment recommended in this evaluation.   Patient has mobility limitation that significantly impairs safe, timely participation in one or more mobility related ADL's. (bathing, toileting, feeding, dressing, grooming, moving from room to room)  [x] Yes [] No  Will mobility device sufficiently improve ability to participate and/or be aided in participation of MRADL's?      [x] Yes [] No  Can limitation be compensated for with use of a cane or walker?                                    [] Yes [x] No  Does patient or caregiver demonstrate ability/potential ability & willingness to safely use the mobility device?    [x] Yes [] No  Does patient's home environment support use of recommended mobility device?            [x] Yes [] No  Does patient have sufficient upper extremity function necessary to functionally propel a manual wheelchair?     [x] Yes [] No  Does patient have sufficient strength and trunk stability to safely operate a POV (scooter)?                                  [] Yes [x] No  Does patient need additional features/benefits provided by a power wheelchair for MRADL's in the home?        [] Yes [x] No  Does the patient demonstrate the ability to safely use a power wheelchair?                   [x] Yes [] No     Physician's Name Printed:                                                        27 Signature:  Date:     This is to certify that I, the above signed therapist have the following affiliations: [] This DME provider [] Manufacturer of recommended equipment [] Patient's long term care facility [x] None of the above  Therapist Name/Signature:    Janna Arch, PT, DPT  Date: 05/15/18                  Objective measurements completed on examination: See above findings.              PT  Education - 05/15/18 1823    Education Details  manual w/c eval    Person(s) Educated  Patient    Methods  Explanation    Comprehension  Verbalized understanding       PT Short Term Goals - 05/15/18 1826      PT SHORT TERM GOAL #1   Title  Patient will be educated on and demonstrate understanding of propulsion and use of manual wheelchair for mobility.     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 Patient is very active and has full mobility and strength of his upper extremities with the exception of his fingers. He has been propelling and utilizing manual chairs for the past >30 years. Due to his current level of function patient will benefit from a Ki [employer]yst 5 manual chair. Patient will require vertical projections for wheel propulsion utilizing wrist and available UE strength. Light weight chair required due to patient's activity level, ability to get in and out of car, and independent mobility. Patient is able to pressure relief independently however due to his level of paralysis patient has difficulty with temperature regulation requiring use of a seat cushion (Axiom XP) for stability, alignment, and pressure relief.  Back with tension adjustable and postural control required for optimal positioning, provide posterior trunk support and pressure relief to spinal processes. Swing away foot plate/hanger/leg rest at 60 degree angles required for optimal positioning of LE's, mobility, and transfers. Ankle straps/heel loops are additionally required for maintaining foot position at this time. Armrests that are swing away, adjustable height, flip back, and desk length required for transfer, mobility, and functional ADLs. Pneumatic tires required for patient's activity levels and function. Rear wheel axles will be  required to be adjustable for patient mobility and lifestyle. Patient does not have trunk stability for scooter use and is independent with manual not needing a power chair at this time.  Due to the above mentioned reasons, the Ki Catalyst 5 is the optimal manual chair for this patient.       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     Clinical Presentation  Stable    Clinical Presentation due to:  >30 years of injury and use of manual chair    Clinical Decision Making  Low    PT Frequency  One time visit    Recommended Other Services  n/a    Consulted and Agree with Plan of Care  Patient       Patient will benefit from skilled therapeutic intervention in order to improve the following deficits and impairments:     Visit Diagnosis: Quadriplegia, C5-C7 incomplete (HCC)     Problem List Patient Active Problem List   Diagnosis Date Noted  . Chronic shoulder pain 10/02/2016  . Syringomyelia (HCC) 10/02/2016  . 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 Long term current use of opiate analgesic   09/13/2016  . Long term prescription opiate use 09/13/2016  . Opiate use 09/13/2016  . Chronic pain syndrome 09/13/2016  . History of hemorrhoids 08/15/2016  . Atonic neurogenic bladder 06/17/2016  . Quadriplegia ([place]) 06/17/2016  . Cervical spinal cord injury ([place]) 02/10/2015  . Functional quadriplegia ([place]) 02/10/2015  . 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 [place], [place], [address] Phone: [phone]   Fax:  [phone]  Name: Jesse Whitney MRN: [id number] Date of Birth: 08/09/1955

## 2018-05-16 DIAGNOSIS — R339 Retention of urine, unspecified: Secondary | ICD-10-CM | POA: Diagnosis not present

## 2018-05-16 DIAGNOSIS — N319 Neuromuscular dysfunction of bladder, unspecified: Secondary | ICD-10-CM | POA: Diagnosis not present

## 2018-05-19 ENCOUNTER — Ambulatory Visit
Admission: RE | Admit: 2018-05-19 | Discharge: 2018-05-19 | Disposition: A | Discharge status: Home / Self Care | Payer: PPO | Source: Ambulatory Visit | Attending: Urology | Admitting: Urology

## 2018-05-19 DIAGNOSIS — N390 Urinary tract infection, site not specified: Secondary | ICD-10-CM | POA: Diagnosis not present

## 2018-05-19 DIAGNOSIS — N319 Neuromuscular dysfunction of bladder, unspecified: Secondary | ICD-10-CM | POA: Diagnosis not present

## 2018-05-21 ENCOUNTER — Telehealth: Payer: Self-pay | Admitting: *Deleted

## 2018-05-21 NOTE — Telephone Encounter (Signed)
Left message for pt to call back  °

## 2018-05-21 NOTE — Telephone Encounter (Signed)
Results given    Jesse Whitney

## 2018-05-21 NOTE — Telephone Encounter (Signed)
-----   Message from Riki Altes, MD sent at 05/20/2018  3:55 PM EST ----- RUS showed no significant abnormalities.

## 2018-05-28 DIAGNOSIS — G8254 Quadriplegia, C5-C7 incomplete: Secondary | ICD-10-CM | POA: Diagnosis not present

## 2018-05-28 DIAGNOSIS — N390 Urinary tract infection, site not specified: Secondary | ICD-10-CM | POA: Diagnosis not present

## 2018-05-28 DIAGNOSIS — M545 Low back pain: Secondary | ICD-10-CM | POA: Diagnosis not present

## 2018-05-28 DIAGNOSIS — I10 Essential (primary) hypertension: Secondary | ICD-10-CM | POA: Diagnosis not present

## 2018-05-28 DIAGNOSIS — G904 Autonomic dysreflexia: Secondary | ICD-10-CM | POA: Diagnosis not present

## 2018-05-28 DIAGNOSIS — Z6822 Body mass index (BMI) 22.0-22.9, adult: Secondary | ICD-10-CM | POA: Diagnosis not present

## 2018-05-28 DIAGNOSIS — E349 Endocrine disorder, unspecified: Secondary | ICD-10-CM | POA: Diagnosis not present

## 2018-05-28 DIAGNOSIS — G950 Syringomyelia and syringobulbia: Secondary | ICD-10-CM | POA: Diagnosis not present

## 2018-05-28 DIAGNOSIS — Z79899 Other long term (current) drug therapy: Secondary | ICD-10-CM | POA: Diagnosis not present

## 2018-06-09 DIAGNOSIS — M25512 Pain in left shoulder: Secondary | ICD-10-CM | POA: Diagnosis not present

## 2018-06-09 DIAGNOSIS — Z6822 Body mass index (BMI) 22.0-22.9, adult: Secondary | ICD-10-CM | POA: Diagnosis not present

## 2018-06-16 ENCOUNTER — Telehealth: Payer: Self-pay | Admitting: Urology

## 2018-06-16 NOTE — Telephone Encounter (Signed)
Pt wants to know if Dr Lonna Cobb can send him in a rx of Septra, for preventive maintenance. He is high risk for infection. Please advise.

## 2018-06-17 ENCOUNTER — Telehealth: Payer: Self-pay | Admitting: Urology

## 2018-06-17 ENCOUNTER — Other Ambulatory Visit: Payer: Self-pay

## 2018-06-17 MED ORDER — SULFAMETHOXAZOLE-TRIMETHOPRIM 800-160 MG PO TABS: 1.0000 | ORAL_TABLET | Freq: Two times a day (BID) | ORAL | 0 refills | Status: AC

## 2018-06-17 NOTE — Progress Notes (Signed)
ERROR

## 2018-06-17 NOTE — Telephone Encounter (Signed)
Rx sent 

## 2018-06-17 NOTE — Telephone Encounter (Signed)
Pt. Called to request Antibiotic Septra be called in to his pharmacy. He is concerned with the [facility] Virus going around that he does not want to go out unless he has to. Pt.states he  does not have any symptoms at this time.

## 2018-06-20 DIAGNOSIS — N319 Neuromuscular dysfunction of bladder, unspecified: Secondary | ICD-10-CM | POA: Diagnosis not present

## 2018-06-20 DIAGNOSIS — R339 Retention of urine, unspecified: Secondary | ICD-10-CM | POA: Diagnosis not present

## 2018-06-26 DIAGNOSIS — G950 Syringomyelia and syringobulbia: Secondary | ICD-10-CM | POA: Diagnosis not present

## 2018-06-26 DIAGNOSIS — I10 Essential (primary) hypertension: Secondary | ICD-10-CM | POA: Diagnosis not present

## 2018-06-26 DIAGNOSIS — E349 Endocrine disorder, unspecified: Secondary | ICD-10-CM | POA: Diagnosis not present

## 2018-06-26 DIAGNOSIS — G8254 Quadriplegia, C5-C7 incomplete: Secondary | ICD-10-CM | POA: Diagnosis not present

## 2018-06-26 DIAGNOSIS — Z6822 Body mass index (BMI) 22.0-22.9, adult: Secondary | ICD-10-CM | POA: Diagnosis not present

## 2018-06-26 DIAGNOSIS — G904 Autonomic dysreflexia: Secondary | ICD-10-CM | POA: Diagnosis not present

## 2018-06-26 DIAGNOSIS — M545 Low back pain: Secondary | ICD-10-CM | POA: Diagnosis not present

## 2018-07-22 DIAGNOSIS — R339 Retention of urine, unspecified: Secondary | ICD-10-CM | POA: Diagnosis not present

## 2018-07-22 DIAGNOSIS — N319 Neuromuscular dysfunction of bladder, unspecified: Secondary | ICD-10-CM | POA: Diagnosis not present

## 2018-07-29 DIAGNOSIS — I10 Essential (primary) hypertension: Secondary | ICD-10-CM | POA: Diagnosis not present

## 2018-07-29 DIAGNOSIS — G950 Syringomyelia and syringobulbia: Secondary | ICD-10-CM | POA: Diagnosis not present

## 2018-07-29 DIAGNOSIS — Z6822 Body mass index (BMI) 22.0-22.9, adult: Secondary | ICD-10-CM | POA: Diagnosis not present

## 2018-07-29 DIAGNOSIS — G904 Autonomic dysreflexia: Secondary | ICD-10-CM | POA: Diagnosis not present

## 2018-07-29 DIAGNOSIS — E349 Endocrine disorder, unspecified: Secondary | ICD-10-CM | POA: Diagnosis not present

## 2018-07-29 DIAGNOSIS — G8254 Quadriplegia, C5-C7 incomplete: Secondary | ICD-10-CM | POA: Diagnosis not present

## 2018-07-29 DIAGNOSIS — M545 Low back pain: Secondary | ICD-10-CM | POA: Diagnosis not present

## 2018-07-29 DIAGNOSIS — N390 Urinary tract infection, site not specified: Secondary | ICD-10-CM | POA: Diagnosis not present

## 2018-07-31 DIAGNOSIS — Z1331 Encounter for screening for depression: Secondary | ICD-10-CM | POA: Diagnosis not present

## 2018-07-31 DIAGNOSIS — Z1211 Encounter for screening for malignant neoplasm of colon: Secondary | ICD-10-CM | POA: Diagnosis not present

## 2018-07-31 DIAGNOSIS — E785 Hyperlipidemia, unspecified: Secondary | ICD-10-CM | POA: Diagnosis not present

## 2018-07-31 DIAGNOSIS — Z125 Encounter for screening for malignant neoplasm of prostate: Secondary | ICD-10-CM | POA: Diagnosis not present

## 2018-07-31 DIAGNOSIS — Z0000 Encounter for general adult medical examination without abnormal findings: Secondary | ICD-10-CM | POA: Diagnosis not present

## 2018-07-31 DIAGNOSIS — Z9181 History of falling: Secondary | ICD-10-CM | POA: Diagnosis not present

## 2018-08-26 DIAGNOSIS — R339 Retention of urine, unspecified: Secondary | ICD-10-CM | POA: Diagnosis not present

## 2018-08-26 DIAGNOSIS — N319 Neuromuscular dysfunction of bladder, unspecified: Secondary | ICD-10-CM | POA: Diagnosis not present

## 2018-09-02 DIAGNOSIS — E349 Endocrine disorder, unspecified: Secondary | ICD-10-CM | POA: Diagnosis not present

## 2018-09-02 DIAGNOSIS — G950 Syringomyelia and syringobulbia: Secondary | ICD-10-CM | POA: Diagnosis not present

## 2018-09-02 DIAGNOSIS — M545 Low back pain: Secondary | ICD-10-CM | POA: Diagnosis not present

## 2018-09-02 DIAGNOSIS — I10 Essential (primary) hypertension: Secondary | ICD-10-CM | POA: Diagnosis not present

## 2018-09-02 DIAGNOSIS — Z6822 Body mass index (BMI) 22.0-22.9, adult: Secondary | ICD-10-CM | POA: Diagnosis not present

## 2018-09-02 DIAGNOSIS — Z125 Encounter for screening for malignant neoplasm of prostate: Secondary | ICD-10-CM | POA: Diagnosis not present

## 2018-09-02 DIAGNOSIS — G8254 Quadriplegia, C5-C7 incomplete: Secondary | ICD-10-CM | POA: Diagnosis not present

## 2018-09-02 DIAGNOSIS — Z79899 Other long term (current) drug therapy: Secondary | ICD-10-CM | POA: Diagnosis not present

## 2018-09-02 DIAGNOSIS — M7712 Lateral epicondylitis, left elbow: Secondary | ICD-10-CM | POA: Diagnosis not present

## 2018-09-02 DIAGNOSIS — G904 Autonomic dysreflexia: Secondary | ICD-10-CM | POA: Diagnosis not present

## 2018-09-15 DIAGNOSIS — G8254 Quadriplegia, C5-C7 incomplete: Secondary | ICD-10-CM | POA: Diagnosis not present

## 2018-09-15 DIAGNOSIS — Z6822 Body mass index (BMI) 22.0-22.9, adult: Secondary | ICD-10-CM | POA: Diagnosis not present

## 2018-09-15 DIAGNOSIS — M7710 Lateral epicondylitis, unspecified elbow: Secondary | ICD-10-CM | POA: Diagnosis not present

## 2018-09-26 DIAGNOSIS — N319 Neuromuscular dysfunction of bladder, unspecified: Secondary | ICD-10-CM | POA: Diagnosis not present

## 2018-09-26 DIAGNOSIS — R339 Retention of urine, unspecified: Secondary | ICD-10-CM | POA: Diagnosis not present

## 2018-10-01 DIAGNOSIS — E349 Endocrine disorder, unspecified: Secondary | ICD-10-CM | POA: Diagnosis not present

## 2018-10-01 DIAGNOSIS — M545 Low back pain: Secondary | ICD-10-CM | POA: Diagnosis not present

## 2018-10-01 DIAGNOSIS — G950 Syringomyelia and syringobulbia: Secondary | ICD-10-CM | POA: Diagnosis not present

## 2018-10-01 DIAGNOSIS — G8254 Quadriplegia, C5-C7 incomplete: Secondary | ICD-10-CM | POA: Diagnosis not present

## 2018-10-01 DIAGNOSIS — Z6822 Body mass index (BMI) 22.0-22.9, adult: Secondary | ICD-10-CM | POA: Diagnosis not present

## 2018-10-01 DIAGNOSIS — I10 Essential (primary) hypertension: Secondary | ICD-10-CM | POA: Diagnosis not present

## 2018-10-01 DIAGNOSIS — G904 Autonomic dysreflexia: Secondary | ICD-10-CM | POA: Diagnosis not present

## 2018-10-01 DIAGNOSIS — M25512 Pain in left shoulder: Secondary | ICD-10-CM | POA: Diagnosis not present

## 2018-10-29 DIAGNOSIS — N319 Neuromuscular dysfunction of bladder, unspecified: Secondary | ICD-10-CM | POA: Diagnosis not present

## 2018-10-29 DIAGNOSIS — R339 Retention of urine, unspecified: Secondary | ICD-10-CM | POA: Diagnosis not present

## 2018-11-03 ENCOUNTER — Other Ambulatory Visit: Payer: Self-pay | Admitting: Physician Assistant

## 2018-11-03 DIAGNOSIS — M545 Low back pain: Secondary | ICD-10-CM | POA: Diagnosis not present

## 2018-11-03 DIAGNOSIS — G904 Autonomic dysreflexia: Secondary | ICD-10-CM | POA: Diagnosis not present

## 2018-11-03 DIAGNOSIS — Z79899 Other long term (current) drug therapy: Secondary | ICD-10-CM | POA: Diagnosis not present

## 2018-11-03 DIAGNOSIS — I10 Essential (primary) hypertension: Secondary | ICD-10-CM | POA: Diagnosis not present

## 2018-11-03 DIAGNOSIS — R600 Localized edema: Secondary | ICD-10-CM | POA: Diagnosis not present

## 2018-11-03 DIAGNOSIS — E349 Endocrine disorder, unspecified: Secondary | ICD-10-CM | POA: Diagnosis not present

## 2018-11-03 DIAGNOSIS — G950 Syringomyelia and syringobulbia: Secondary | ICD-10-CM | POA: Diagnosis not present

## 2018-11-03 DIAGNOSIS — Z6822 Body mass index (BMI) 22.0-22.9, adult: Secondary | ICD-10-CM | POA: Diagnosis not present

## 2018-11-03 DIAGNOSIS — G8254 Quadriplegia, C5-C7 incomplete: Secondary | ICD-10-CM | POA: Diagnosis not present

## 2018-11-04 ENCOUNTER — Other Ambulatory Visit: Payer: Self-pay | Admitting: Physician Assistant

## 2018-11-04 DIAGNOSIS — R600 Localized edema: Secondary | ICD-10-CM

## 2018-11-06 ENCOUNTER — Ambulatory Visit
Admission: RE | Admit: 2018-11-06 | Discharge: 2018-11-06 | Disposition: A | Discharge status: Home / Self Care | Payer: PPO | Source: Ambulatory Visit | Attending: Physician Assistant | Admitting: Physician Assistant

## 2018-11-06 ENCOUNTER — Other Ambulatory Visit: Payer: Self-pay

## 2018-11-06 DIAGNOSIS — R600 Localized edema: Secondary | ICD-10-CM | POA: Insufficient documentation

## 2018-11-06 DIAGNOSIS — I82411 Acute embolism and thrombosis of right femoral vein: Secondary | ICD-10-CM | POA: Diagnosis not present

## 2018-11-14 DIAGNOSIS — E349 Endocrine disorder, unspecified: Secondary | ICD-10-CM | POA: Diagnosis not present

## 2018-11-14 DIAGNOSIS — G8254 Quadriplegia, C5-C7 incomplete: Secondary | ICD-10-CM | POA: Diagnosis not present

## 2018-11-14 DIAGNOSIS — I82401 Acute embolism and thrombosis of unspecified deep veins of right lower extremity: Secondary | ICD-10-CM | POA: Diagnosis not present

## 2018-11-14 DIAGNOSIS — R5383 Other fatigue: Secondary | ICD-10-CM | POA: Diagnosis not present

## 2018-11-19 ENCOUNTER — Other Ambulatory Visit: Payer: Self-pay

## 2018-11-19 ENCOUNTER — Telehealth (INDEPENDENT_AMBULATORY_CARE_PROVIDER_SITE_OTHER): Payer: Self-pay | Admitting: Vascular Surgery

## 2018-11-19 ENCOUNTER — Encounter (INDEPENDENT_AMBULATORY_CARE_PROVIDER_SITE_OTHER): Payer: Self-pay | Admitting: Nurse Practitioner

## 2018-11-19 ENCOUNTER — Ambulatory Visit (INDEPENDENT_AMBULATORY_CARE_PROVIDER_SITE_OTHER): Payer: PPO | Admitting: Nurse Practitioner

## 2018-11-19 ENCOUNTER — Telehealth (INDEPENDENT_AMBULATORY_CARE_PROVIDER_SITE_OTHER): Payer: Self-pay

## 2018-11-19 VITALS — BP 138/92 | HR 59 | Resp 12 | Ht 68.0 in | Wt 150.0 lb

## 2018-11-19 DIAGNOSIS — M19112 Post-traumatic osteoarthritis, left shoulder: Secondary | ICD-10-CM

## 2018-11-19 DIAGNOSIS — M19111 Post-traumatic osteoarthritis, right shoulder: Secondary | ICD-10-CM

## 2018-11-19 DIAGNOSIS — G904 Autonomic dysreflexia: Secondary | ICD-10-CM

## 2018-11-19 DIAGNOSIS — I82411 Acute embolism and thrombosis of right femoral vein: Secondary | ICD-10-CM | POA: Diagnosis not present

## 2018-11-19 NOTE — Telephone Encounter (Signed)
Patient was in the office and the procedure was scheduled. Patient had not decided if he wanted to do the procedure but I was instructed to schedule due to short window. Patient will contact office with decision. AS, CMA

## 2018-11-19 NOTE — Telephone Encounter (Signed)
Patient called back and is scheduled with Dr. Lucky Cowboy for 11/24/2018 with a 1:30 pm arrival time to the [facility]. Patient will do his Covid testing on 11/20/2018 between 12:30-2:30 pm at the [place]. All pre-procedure instructions were discussed. Patient stated he was writing the information down.

## 2018-11-20 ENCOUNTER — Telehealth (INDEPENDENT_AMBULATORY_CARE_PROVIDER_SITE_OTHER): Payer: Self-pay | Admitting: Nurse Practitioner

## 2018-11-20 ENCOUNTER — Inpatient Hospital Stay: Payer: PPO

## 2018-11-20 ENCOUNTER — Inpatient Hospital Stay: Payer: PPO | Attending: Oncology | Admitting: Oncology

## 2018-11-20 ENCOUNTER — Other Ambulatory Visit: Payer: Self-pay

## 2018-11-20 ENCOUNTER — Encounter: Payer: Self-pay | Admitting: Oncology

## 2018-11-20 ENCOUNTER — Other Ambulatory Visit
Admission: RE | Admit: 2018-11-20 | Discharge: 2018-11-20 | Disposition: A | Discharge status: Home / Self Care | Payer: PPO | Source: Ambulatory Visit | Attending: Vascular Surgery | Admitting: Vascular Surgery

## 2018-11-20 VITALS — BP 116/70 | HR 66

## 2018-11-20 DIAGNOSIS — G904 Autonomic dysreflexia: Secondary | ICD-10-CM | POA: Diagnosis not present

## 2018-11-20 DIAGNOSIS — I10 Essential (primary) hypertension: Secondary | ICD-10-CM

## 2018-11-20 DIAGNOSIS — I824Y9 Acute embolism and thrombosis of unspecified deep veins of unspecified proximal lower extremity: Secondary | ICD-10-CM

## 2018-11-20 DIAGNOSIS — Z20828 Contact with and (suspected) exposure to other viral communicable diseases: Secondary | ICD-10-CM | POA: Diagnosis not present

## 2018-11-20 DIAGNOSIS — Z803 Family history of malignant neoplasm of breast: Secondary | ICD-10-CM

## 2018-11-20 DIAGNOSIS — I82411 Acute embolism and thrombosis of right femoral vein: Secondary | ICD-10-CM | POA: Diagnosis not present

## 2018-11-20 DIAGNOSIS — G8250 Quadriplegia, unspecified: Secondary | ICD-10-CM | POA: Insufficient documentation

## 2018-11-20 DIAGNOSIS — Z79899 Other long term (current) drug therapy: Secondary | ICD-10-CM | POA: Insufficient documentation

## 2018-11-20 DIAGNOSIS — Z01812 Encounter for preprocedural laboratory examination: Secondary | ICD-10-CM | POA: Diagnosis not present

## 2018-11-20 DIAGNOSIS — Z7901 Long term (current) use of anticoagulants: Secondary | ICD-10-CM

## 2018-11-20 LAB — COMPREHENSIVE METABOLIC PANEL
ALT: 20 U/L (ref 0–44)
AST: 24 U/L (ref 15–41)
Albumin: 3.7 g/dL (ref 3.5–5.0)
Alkaline Phosphatase: 45 U/L (ref 38–126)
Anion gap: 7 (ref 5–15)
BUN: 10 mg/dL (ref 8–23)
CO2: 33 mmol/L — ABNORMAL HIGH (ref 22–32)
Calcium: 8.7 mg/dL — ABNORMAL LOW (ref 8.9–10.3)
Chloride: 99 mmol/L (ref 98–111)
Creatinine, Ser: 0.54 mg/dL — ABNORMAL LOW (ref 0.61–1.24)
GFR calc Af Amer: >60 mL/min (ref >60)
GFR calc non Af Amer: >60 mL/min (ref >60)
Glucose, Bld: 90 mg/dL (ref 70–99)
Potassium: 3.6 mmol/L (ref 3.5–5.1)
Sodium: 139 mmol/L (ref 135–145)
Total Bilirubin: 0.9 mg/dL (ref 0.3–1.2)
Total Protein: 6.6 g/dL (ref 6.5–8.1)

## 2018-11-20 LAB — CBC WITH DIFFERENTIAL/PLATELET
Abs Immature Granulocytes: 0.03 10*3/uL (ref 0.00–0.07)
Basophils Absolute: 0 10*3/uL (ref 0.0–0.1)
Basophils Relative: 0 %
Eosinophils Absolute: 0.2 10*3/uL (ref 0.0–0.5)
Eosinophils Relative: 3 %
HCT: 44.5 % (ref 39.0–52.0)
Hemoglobin: 15.7 g/dL (ref 13.0–17.0)
Immature Granulocytes: 0 %
Lymphocytes Relative: 16 %
Lymphs Abs: 1.2 10*3/uL (ref 0.7–4.0)
MCH: 33.5 pg (ref 26.0–34.0)
MCHC: 35.3 g/dL (ref 30.0–36.0)
MCV: 95.1 fL (ref 80.0–100.0)
Monocytes Absolute: 0.6 10*3/uL (ref 0.1–1.0)
Monocytes Relative: 9 %
Neutro Abs: 5.1 10*3/uL (ref 1.7–7.7)
Neutrophils Relative %: 72 %
Platelets: 193 10*3/uL (ref 150–400)
RBC: 4.68 MIL/uL (ref 4.22–5.81)
RDW: 13.3 % (ref 11.5–15.5)
WBC: 7.1 10*3/uL (ref 4.0–10.5)
nRBC: 0 % (ref 0.0–0.2)

## 2018-11-20 LAB — SARS CORONAVIRUS 2 (TAT 6-24 HRS): SARS Coronavirus 2: NEGATIVE

## 2018-11-20 NOTE — Progress Notes (Signed)
Patient here today for new patient consult for right leg DVT. Patient denies any decrease in appetite, nausea, vomiting, diarrhea, constipation or SOB.  Patient has some pain in right leg into right buttock.
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
## 2018-11-20 NOTE — Telephone Encounter (Signed)
Jesse Whitney will need in detail what questions the patient has. Thank you

## 2018-11-21 ENCOUNTER — Encounter: Payer: PPO | Admitting: Oncology

## 2018-11-21 ENCOUNTER — Telehealth (INDEPENDENT_AMBULATORY_CARE_PROVIDER_SITE_OTHER): Payer: Self-pay | Admitting: Nurse Practitioner

## 2018-11-21 NOTE — Telephone Encounter (Signed)
Left a message for the patient to call back with his questions.

## 2018-11-21 NOTE — Telephone Encounter (Signed)
Can you please collect his questions.  Due to my schedule today I will not be able to call him directly but we can answer his questions.

## 2018-11-21 NOTE — Telephone Encounter (Signed)
Yes, he made [place] aware during his office visit and I have mentioned that in our office note and directly spoken with Dr. Lucky Cowboy regarding this to be sure he and the team are aware.

## 2018-11-21 NOTE — Telephone Encounter (Signed)
Patient is scheduled for angio on 11/24/18 and was advised to d/c Eliquis 3 days before the procedure. Called patient back and advised him of this. Patient verbalized understanding. AS< CMA

## 2018-11-23 ENCOUNTER — Other Ambulatory Visit (INDEPENDENT_AMBULATORY_CARE_PROVIDER_SITE_OTHER): Payer: Self-pay | Admitting: Nurse Practitioner

## 2018-11-23 ENCOUNTER — Encounter (INDEPENDENT_AMBULATORY_CARE_PROVIDER_SITE_OTHER): Payer: Self-pay | Admitting: Nurse Practitioner

## 2018-11-23 DIAGNOSIS — I82409 Acute embolism and thrombosis of unspecified deep veins of unspecified lower extremity: Secondary | ICD-10-CM | POA: Insufficient documentation

## 2018-11-23 MED ORDER — CEFAZOLIN SODIUM-DEXTROSE 2-4 GM/100ML-% IV SOLN
2.0000 g | Freq: Once | INTRAVENOUS | Status: AC
  Administered 2018-11-24: 2 g via INTRAVENOUS
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
## 2018-11-24 ENCOUNTER — Ambulatory Visit
Admission: RE | Admit: 2018-11-24 | Discharge: 2018-11-24 | Disposition: A | Discharge status: Home / Self Care | Payer: PPO | Attending: Vascular Surgery | Admitting: Vascular Surgery

## 2018-11-24 ENCOUNTER — Other Ambulatory Visit: Payer: Self-pay

## 2018-11-24 ENCOUNTER — Encounter
Admission: RE | Disposition: A | Discharge status: Home / Self Care | Payer: Self-pay | Source: Home / Self Care | Attending: Vascular Surgery

## 2018-11-24 DIAGNOSIS — Z87891 Personal history of nicotine dependence: Secondary | ICD-10-CM | POA: Diagnosis not present

## 2018-11-24 DIAGNOSIS — I82409 Acute embolism and thrombosis of unspecified deep veins of unspecified lower extremity: Secondary | ICD-10-CM

## 2018-11-24 DIAGNOSIS — G8250 Quadriplegia, unspecified: Secondary | ICD-10-CM | POA: Insufficient documentation

## 2018-11-24 DIAGNOSIS — G904 Autonomic dysreflexia: Secondary | ICD-10-CM | POA: Insufficient documentation

## 2018-11-24 DIAGNOSIS — Z79899 Other long term (current) drug therapy: Secondary | ICD-10-CM | POA: Insufficient documentation

## 2018-11-24 DIAGNOSIS — I10 Essential (primary) hypertension: Secondary | ICD-10-CM | POA: Diagnosis not present

## 2018-11-24 DIAGNOSIS — I82421 Acute embolism and thrombosis of right iliac vein: Secondary | ICD-10-CM

## 2018-11-24 DIAGNOSIS — M19112 Post-traumatic osteoarthritis, left shoulder: Secondary | ICD-10-CM | POA: Diagnosis not present

## 2018-11-24 DIAGNOSIS — Z91041 Radiographic dye allergy status: Secondary | ICD-10-CM | POA: Insufficient documentation

## 2018-11-24 DIAGNOSIS — I82411 Acute embolism and thrombosis of right femoral vein: Secondary | ICD-10-CM | POA: Insufficient documentation

## 2018-11-24 DIAGNOSIS — Z7901 Long term (current) use of anticoagulants: Secondary | ICD-10-CM | POA: Insufficient documentation

## 2018-11-24 DIAGNOSIS — M19111 Post-traumatic osteoarthritis, right shoulder: Secondary | ICD-10-CM | POA: Diagnosis not present

## 2018-11-24 DIAGNOSIS — Z888 Allergy status to other drugs, medicaments and biological substances status: Secondary | ICD-10-CM | POA: Insufficient documentation

## 2018-11-24 HISTORY — PX: PERIPHERAL VASCULAR THROMBECTOMY: CATH118306

## 2018-11-24 SURGERY — PERIPHERAL VASCULAR THROMBECTOMY
Anesthesia: Moderate Sedation | Laterality: Right

## 2018-11-24 MED ORDER — ALTEPLASE 2 MG IJ SOLR
INTRAMUSCULAR | Status: DC | PRN
  Administered 2018-11-24: 8 mg

## 2018-11-24 MED ORDER — MIDAZOLAM HCL 2 MG/ML PO SYRP: 8.0000 mg | ORAL_SOLUTION | Freq: Once | ORAL | Status: DC | PRN

## 2018-11-24 MED ORDER — MIDAZOLAM HCL 2 MG/2ML IJ SOLN
INTRAMUSCULAR | Status: DC | PRN
  Administered 2018-11-24: 2 mg via INTRAVENOUS

## 2018-11-24 MED ORDER — ONDANSETRON HCL 4 MG/2ML IJ SOLN
INTRAMUSCULAR | Status: AC
  Filled 2018-11-24: qty 2

## 2018-11-24 MED ORDER — SODIUM CHLORIDE 0.9 % IV SOLN
INTRAVENOUS | Status: DC
  Administered 2018-11-24: 15:00:00 via INTRAVENOUS

## 2018-11-24 MED ORDER — FENTANYL CITRATE (PF) 100 MCG/2ML IJ SOLN
INTRAMUSCULAR | Status: DC | PRN
  Administered 2018-11-24: 50 ug via INTRAVENOUS

## 2018-11-24 MED ORDER — METHYLPREDNISOLONE SODIUM SUCC 125 MG IJ SOLR
INTRAMUSCULAR | Status: AC
  Filled 2018-11-24: qty 2

## 2018-11-24 MED ORDER — HYDROMORPHONE HCL 1 MG/ML IJ SOLN: 1.0000 mg | Freq: Once | INTRAMUSCULAR | Status: DC | PRN

## 2018-11-24 MED ORDER — CEFAZOLIN SODIUM-DEXTROSE 2-4 GM/100ML-% IV SOLN
INTRAVENOUS | Status: AC
  Administered 2018-11-24: 2 g via INTRAVENOUS
  Filled 2018-11-24: qty 100

## 2018-11-24 MED ORDER — FENTANYL CITRATE (PF) 100 MCG/2ML IJ SOLN
INTRAMUSCULAR | Status: AC
  Filled 2018-11-24: qty 2

## 2018-11-24 MED ORDER — HEPARIN SODIUM (PORCINE) 1000 UNIT/ML IJ SOLN
INTRAMUSCULAR | Status: DC | PRN
  Administered 2018-11-24: 3000 [IU] via INTRAVENOUS

## 2018-11-24 MED ORDER — HEPARIN SODIUM (PORCINE) 1000 UNIT/ML IJ SOLN
INTRAMUSCULAR | Status: AC
  Filled 2018-11-24: qty 1

## 2018-11-24 MED ORDER — METHYLPREDNISOLONE SODIUM SUCC 125 MG IJ SOLR
125.0000 mg | Freq: Once | INTRAMUSCULAR | Status: AC | PRN
  Administered 2018-11-24: 15:00:00 125 mg via INTRAVENOUS

## 2018-11-24 MED ORDER — FAMOTIDINE 20 MG PO TABS
ORAL_TABLET | ORAL | Status: AC
  Filled 2018-11-24: qty 2

## 2018-11-24 MED ORDER — DIPHENHYDRAMINE HCL 50 MG/ML IJ SOLN
INTRAMUSCULAR | Status: AC
  Filled 2018-11-24: qty 1

## 2018-11-24 MED ORDER — MIDAZOLAM HCL 5 MG/5ML IJ SOLN
INTRAMUSCULAR | Status: AC
  Filled 2018-11-24: qty 5

## 2018-11-24 MED ORDER — OXYCODONE HCL 5 MG PO TABS
15.0000 mg | ORAL_TABLET | Freq: Once | ORAL | Status: AC
  Administered 2018-11-24: 18:00:00 15 mg via ORAL

## 2018-11-24 MED ORDER — DIPHENHYDRAMINE HCL 50 MG/ML IJ SOLN
50.0000 mg | Freq: Once | INTRAMUSCULAR | Status: AC | PRN
  Administered 2018-11-24: 50 mg via INTRAVENOUS

## 2018-11-24 MED ORDER — ONDANSETRON HCL 4 MG/2ML IJ SOLN
4.0000 mg | Freq: Four times a day (QID) | INTRAMUSCULAR | Status: DC | PRN
  Administered 2018-11-24: 15:00:00 4 mg via INTRAVENOUS

## 2018-11-24 MED ORDER — FAMOTIDINE 20 MG PO TABS
40.0000 mg | ORAL_TABLET | Freq: Once | ORAL | Status: AC | PRN
  Administered 2018-11-24: 40 mg via ORAL

## 2018-11-24 MED ORDER — OXYCODONE HCL 5 MG PO TABS
ORAL_TABLET | ORAL | Status: AC
  Administered 2018-11-24: 18:00:00 15 mg via ORAL
  Filled 2018-11-24: qty 3

## 2018-11-24 MED ORDER — ALTEPLASE 2 MG IJ SOLR
INTRAMUSCULAR | Status: AC
  Filled 2018-11-24: qty 8

## 2018-11-24 SURGICAL SUPPLY — 15 items
BALLN ATG 12X6X80 (BALLOONS) ×2
BALLN DORADO 10X80X80 (BALLOONS) ×2
BALLOON ATG 12X6X80 (BALLOONS) IMPLANT
BALLOON DORADO 10X80X80 (BALLOONS) IMPLANT
CANISTER PENUMBRA ENGINE (MISCELLANEOUS) ×1 IMPLANT
CANNULA 5F STIFF (CANNULA) ×1 IMPLANT
CATH BEACON 5 .035 65 KMP TIP (CATHETERS) ×1 IMPLANT
CATH INDIGO 12XTORQ 100 (CATHETERS) ×1 IMPLANT
DEVICE PRESTO INFLATION (MISCELLANEOUS) ×1 IMPLANT
PACK ANGIOGRAPHY (CUSTOM PROCEDURE TRAY) ×2 IMPLANT
SHEATH PINNACLE 11FRX10 (SHEATH) ×1 IMPLANT
STENT VENOVO 14X80X80 (Permanent Stent) ×1 IMPLANT
TOWEL OR 17X26 4PK STRL BLUE (TOWEL DISPOSABLE) ×1 IMPLANT
WIRE J 3MM .035X145CM (WIRE) ×1 IMPLANT
WIRE MAGIC TORQUE 260C (WIRE) ×1 IMPLANT

## 2018-11-24 NOTE — Op Note (Signed)
VEIN AND VASCULAR SURGERY   OPERATIVE NOTE   PRE-OPERATIVE DIAGNOSIS: extensive RLE DVT  POST-OPERATIVE DIAGNOSIS: same   PROCEDURE: 1. US guidance for vascular access to  2. Catheter placement into IVC and right iliac vein from the right popliteal approach 3. IVC gram and right lower extremity venogram 4.  Catheter directed thrombolysis with 8 mg of TPA to the right SFV, common femoral vein, and iliac veins 5. Mechanical thrombectomy to right SFV, common femoral vein, and iliac veins 6. PTA of right SFV and common femoral veins with 10 mm balloon 7. PTA of right common and external iliac veins with 10 mm balloon 8.   Venovo stent placement to the right common and external iliac veins with a 14 mm diameter by 8 cm length stent  SURGEON: Festus BarrenJason Lauryn Lizardi, MD  ASSISTANT(S): none  ANESTHESIA: local with moderate conscious sedation for 50 minutes using 2 mg of Versed and 50 mcg of Fentanyl  ESTIMATED BLOOD LOSS: 300 cc  FINDING(S): 1. Extensive right lower extremity DVT with some chronic appearing DVT in the femoral and iliac veins with severe narrowing  SPECIMEN(S): none  INDICATIONS:  Patient is a 63 y.o. male who presents with marked right leg swelling and an extensive right lower extremity DVT. Patient has marked leg swelling and pain. Venous intervention is performed to reduce the symtpoms and avoid long term postphlebitic symptoms.   DESCRIPTION: After obtaining full informed written consent, the patient was brought back to the vascular suite and placed supine upon the table.Moderate conscious sedation was administered during a face to face encounter with the patient throughout the procedure with my supervision of the RN administering medicines and monitoring the patient's vital signs, pulse oximetry, telemetry and mental status throughout from the start of the procedure until the patient was taken to the recovery room. After obtaining adequate  anesthesia, the patient was prepped and draped in the standard fashion. The patient was then placed into the prone position. The right popliteal vein was then accessed under direct ultrasound guidance without difficulty with a micropuncture needle and a permanent image was recorded. I then upsized to an 11Fr sheath over a J wire. 3000 units of heparin were then given. Imaging showed extensive DVT with minimal flow. A Kumpe catheter and Magic tourque wire were then advanced into the CFV and images were performed. The common femoral vein and iliac vein were essentially occluded with what appeared to be thrombus some of which was chronic and there was little flow.  I was able to cross the thrombus and stenosis and advance into the IVC which was patent. I then used the Kumpe catheter and instilled 8 mg of of tpa throughout the right iliac, common femoral, and superficial femoral veins.  After this dwelled, I used the Penumbra Cat 12 catheter and evacuated about 150 cc of effluent with mechanical thrombectomy throughout the iliac veins, CFV, and superficial femoral vein. This had mild improvement. I then treated the iliac veins, SFV, and CFV with 4 inflations with a 8 cm long 10 mm diameter angioplasty balloon to open a channel. This resulted in some resolution of the thrombus and improved flow. Several areas of residual thrombus remained in another pass with the penumbra cat 12 catheter was done in the superficial femoral vein, common femoral vein, and up into the iliac vein.  Following this, the superficial femoral vein and common femoral vein were markedly improved with minimal residual thrombus and a good channel but there remained a high-grade narrowing  in the iliac vein of greater than 75%.  A 14 mm diameter by 8 cm length stent was then deployed from the distal common iliac vein down to the distal external iliac vein and was postdilated with a 12 mm balloon with excellent angiographic completion  result and less than 10% residual stenosis.  I then elected to terminate the procedure. The sheath was removed and a dressing was placed. She was taken to the recovery room in stable condition having tolerated the procedure well.   COMPLICATIONS: None  CONDITION: Stable  Leotis Pain 11/24/2018 4:58 PM

## 2018-11-24 NOTE — H&P (Signed)
[facility] History & Physical Update  The patient was interviewed and re-examined.  The patient's previous History and Physical has been reviewed and is unchanged.  There is no change in the plan of care. We plan to proceed with the scheduled procedure.  Leotis Pain, MD  11/24/2018, 1:48 PM

## 2018-11-25 ENCOUNTER — Telehealth (INDEPENDENT_AMBULATORY_CARE_PROVIDER_SITE_OTHER): Payer: Self-pay | Admitting: Vascular Surgery

## 2018-11-25 ENCOUNTER — Encounter: Payer: Self-pay | Admitting: Vascular Surgery

## 2018-11-25 NOTE — Telephone Encounter (Signed)
Patient spouse called stating patient had thrombectomy 11/24/18 and is now running a temp of 100.9. Requesting advise. AS, CMA

## 2018-11-25 NOTE — Telephone Encounter (Signed)
Spoke with Dr. Lucky Cowboy- he advised this was okay and for patient to take Tylenol. Call back if symptoms worsen or progress. Advised wife of this and she verbalized understanding. AS, CMA

## 2018-11-26 DIAGNOSIS — N319 Neuromuscular dysfunction of bladder, unspecified: Secondary | ICD-10-CM | POA: Diagnosis not present

## 2018-11-26 DIAGNOSIS — R339 Retention of urine, unspecified: Secondary | ICD-10-CM | POA: Diagnosis not present

## 2018-11-26 LAB — PROTHROMBIN GENE MUTATION

## 2018-11-26 LAB — FACTOR 5 LEIDEN

## 2018-12-01 DIAGNOSIS — Z1211 Encounter for screening for malignant neoplasm of colon: Secondary | ICD-10-CM | POA: Diagnosis not present

## 2018-12-09 ENCOUNTER — Ambulatory Visit (INDEPENDENT_AMBULATORY_CARE_PROVIDER_SITE_OTHER): Payer: PPO | Admitting: Vascular Surgery

## 2018-12-09 ENCOUNTER — Encounter (INDEPENDENT_AMBULATORY_CARE_PROVIDER_SITE_OTHER): Payer: PPO

## 2018-12-15 DIAGNOSIS — I10 Essential (primary) hypertension: Secondary | ICD-10-CM | POA: Diagnosis not present

## 2018-12-15 DIAGNOSIS — G950 Syringomyelia and syringobulbia: Secondary | ICD-10-CM | POA: Diagnosis not present

## 2018-12-15 DIAGNOSIS — G904 Autonomic dysreflexia: Secondary | ICD-10-CM | POA: Diagnosis not present

## 2018-12-15 DIAGNOSIS — I82401 Acute embolism and thrombosis of unspecified deep veins of right lower extremity: Secondary | ICD-10-CM | POA: Diagnosis not present

## 2018-12-15 DIAGNOSIS — E349 Endocrine disorder, unspecified: Secondary | ICD-10-CM | POA: Diagnosis not present

## 2018-12-15 DIAGNOSIS — M25512 Pain in left shoulder: Secondary | ICD-10-CM | POA: Diagnosis not present

## 2018-12-15 DIAGNOSIS — R195 Other fecal abnormalities: Secondary | ICD-10-CM | POA: Diagnosis not present

## 2018-12-15 DIAGNOSIS — G8254 Quadriplegia, C5-C7 incomplete: Secondary | ICD-10-CM | POA: Diagnosis not present

## 2018-12-15 DIAGNOSIS — M25511 Pain in right shoulder: Secondary | ICD-10-CM | POA: Diagnosis not present

## 2018-12-15 DIAGNOSIS — M545 Low back pain: Secondary | ICD-10-CM | POA: Diagnosis not present

## 2018-12-26 DIAGNOSIS — N319 Neuromuscular dysfunction of bladder, unspecified: Secondary | ICD-10-CM | POA: Diagnosis not present

## 2018-12-26 DIAGNOSIS — R339 Retention of urine, unspecified: Secondary | ICD-10-CM | POA: Diagnosis not present

## 2019-01-02 ENCOUNTER — Other Ambulatory Visit (INDEPENDENT_AMBULATORY_CARE_PROVIDER_SITE_OTHER): Payer: Self-pay | Admitting: Vascular Surgery

## 2019-01-02 DIAGNOSIS — I82401 Acute embolism and thrombosis of unspecified deep veins of right lower extremity: Secondary | ICD-10-CM

## 2019-01-06 ENCOUNTER — Other Ambulatory Visit: Payer: Self-pay

## 2019-01-06 ENCOUNTER — Encounter (INDEPENDENT_AMBULATORY_CARE_PROVIDER_SITE_OTHER): Payer: Self-pay | Admitting: Vascular Surgery

## 2019-01-06 ENCOUNTER — Ambulatory Visit (INDEPENDENT_AMBULATORY_CARE_PROVIDER_SITE_OTHER): Payer: PPO | Admitting: Vascular Surgery

## 2019-01-06 ENCOUNTER — Ambulatory Visit (INDEPENDENT_AMBULATORY_CARE_PROVIDER_SITE_OTHER): Payer: PPO

## 2019-01-06 VITALS — BP 116/75 | HR 73 | Resp 12 | Ht 68.0 in | Wt 150.0 lb

## 2019-01-06 DIAGNOSIS — S14109D Unspecified injury at unspecified level of cervical spinal cord, subsequent encounter: Secondary | ICD-10-CM | POA: Diagnosis not present

## 2019-01-06 DIAGNOSIS — I82411 Acute embolism and thrombosis of right femoral vein: Secondary | ICD-10-CM | POA: Diagnosis not present

## 2019-01-06 DIAGNOSIS — I82401 Acute embolism and thrombosis of unspecified deep veins of right lower extremity: Secondary | ICD-10-CM

## 2019-01-06 NOTE — Patient Instructions (Signed)

## 2019-01-06 NOTE — Assessment & Plan Note (Signed)
His duplex today shows complete resolution of the right lower extremity DVT with no residual DVT identified.  This is a dramatic improvement after intervention and his clinical response has been just as dramatic.  This is highly encouraging.  At this point, I will plan to see him back in about 4 months to discuss his long-term anticoagulation regimen.  Given his immobility from paraplegia, he would likely benefit from long-term anticoagulation but I think we could use a lower dose of 1 of the newer agents.

## 2019-01-06 NOTE — Assessment & Plan Note (Signed)
With paraplegia.  This was likely wonder the underlying causes that precipitated the DVT.
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
## 2019-01-08 ENCOUNTER — Telehealth: Payer: Self-pay | Admitting: Urology

## 2019-01-08 NOTE — Telephone Encounter (Signed)
Appt made with [facility] for tomorrow, pt confirmed.

## 2019-01-08 NOTE — Telephone Encounter (Signed)
Please have pt added to Sam's schedule for UTI work up if he does not wish to wait until his appt on 01/14/2019. Thanks

## 2019-01-08 NOTE — Telephone Encounter (Signed)
Pt called and states that he did a home test kit for UTI and it is positive. He was asking if he could have some Ciproflaxin called in since he has an appt on 01/14/2019.

## 2019-01-09 ENCOUNTER — Other Ambulatory Visit: Payer: Self-pay

## 2019-01-09 ENCOUNTER — Ambulatory Visit: Payer: PPO | Admitting: Urology

## 2019-01-09 ENCOUNTER — Encounter: Payer: Self-pay | Admitting: Urology

## 2019-01-09 VITALS — BP 111/64 | HR 76 | Ht 68.0 in | Wt 150.0 lb

## 2019-01-09 DIAGNOSIS — N319 Neuromuscular dysfunction of bladder, unspecified: Secondary | ICD-10-CM | POA: Diagnosis not present

## 2019-01-09 DIAGNOSIS — N390 Urinary tract infection, site not specified: Secondary | ICD-10-CM | POA: Diagnosis not present

## 2019-01-09 LAB — URINALYSIS, COMPLETE
Bilirubin, UA: NEGATIVE
Glucose, UA: NEGATIVE
Ketones, UA: NEGATIVE
Nitrite, UA: NEGATIVE
Protein,UA: NEGATIVE
Specific Gravity, UA: 1.015 (ref 1.005–1.030)
Urobilinogen, Ur: 0.2 mg/dL (ref 0.2–1.0)
pH, UA: 7.5 (ref 5.0–7.5)

## 2019-01-09 LAB — MICROSCOPIC EXAMINATION
Bacteria, UA: NONE SEEN
WBC, UA: >30 /hpf — AB (ref 0–5)

## 2019-01-09 MED ORDER — MIRABEGRON ER 50 MG PO TB24: 50.0000 mg | ORAL_TABLET | Freq: Every day | ORAL | 0 refills | Status: DC

## 2019-01-09 MED ORDER — CIPROFLOXACIN HCL 500 MG PO TABS: 500.0000 mg | ORAL_TABLET | Freq: Two times a day (BID) | ORAL | 0 refills | Status: DC
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
## 2019-01-12 LAB — CULTURE, URINE COMPREHENSIVE

## 2019-01-13 ENCOUNTER — Telehealth: Payer: Self-pay

## 2019-01-13 MED ORDER — SULFAMETHOXAZOLE-TRIMETHOPRIM 800-160 MG PO TABS: 1.0000 | ORAL_TABLET | Freq: Two times a day (BID) | ORAL | 0 refills | Status: DC

## 2019-01-13 NOTE — Telephone Encounter (Signed)
Spoke with patient advised that urine culture was positive and medication changed was needed.  Advised patient to stop Cipro and Septra was sent to pharmacy.  Patient agreeable.

## 2019-01-14 ENCOUNTER — Ambulatory Visit: Payer: PPO | Admitting: Urology

## 2019-01-14 ENCOUNTER — Other Ambulatory Visit: Payer: Self-pay

## 2019-01-14 ENCOUNTER — Encounter: Payer: Self-pay | Admitting: Urology

## 2019-01-14 VITALS — BP 133/86 | HR 96

## 2019-01-14 DIAGNOSIS — G8250 Quadriplegia, unspecified: Secondary | ICD-10-CM

## 2019-01-14 DIAGNOSIS — N390 Urinary tract infection, site not specified: Secondary | ICD-10-CM

## 2019-01-14 DIAGNOSIS — N312 Flaccid neuropathic bladder, not elsewhere classified: Secondary | ICD-10-CM | POA: Diagnosis not present
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
## 2019-01-15 LAB — URINALYSIS, COMPLETE
Bilirubin, UA: NEGATIVE
Glucose, UA: NEGATIVE
Ketones, UA: NEGATIVE
Nitrite, UA: NEGATIVE
Protein,UA: NEGATIVE
RBC, UA: NEGATIVE
Specific Gravity, UA: 1.02 (ref 1.005–1.030)
Urobilinogen, Ur: 0.2 mg/dL (ref 0.2–1.0)
pH, UA: 7.5 (ref 5.0–7.5)

## 2019-01-15 LAB — MICROSCOPIC EXAMINATION
Bacteria, UA: NONE SEEN
RBC, Urine: NONE SEEN /hpf (ref 0–2)
WBC, UA: >30 /hpf — AB (ref 0–5)

## 2019-01-18 ENCOUNTER — Encounter: Payer: Self-pay | Admitting: Urology

## 2019-01-19 LAB — CULTURE, URINE COMPREHENSIVE

## 2019-01-25 ENCOUNTER — Other Ambulatory Visit: Payer: Self-pay | Admitting: Urology

## 2019-01-25 DIAGNOSIS — R8271 Bacteriuria: Secondary | ICD-10-CM

## 2019-01-26 ENCOUNTER — Telehealth: Payer: Self-pay

## 2019-01-26 NOTE — Telephone Encounter (Signed)
-----   Message from Abbie Sons, MD sent at 01/25/2019  5:26 PM EDT ----- Urine culture grew low-level MRSA.  Recommend evaluation with infectious disease.  Consult order placed.

## 2019-01-26 NOTE — Telephone Encounter (Signed)
Patient notified

## 2019-01-27 ENCOUNTER — Telehealth: Payer: Self-pay | Admitting: Infectious Diseases

## 2019-01-27 NOTE — Telephone Encounter (Signed)
Called to make appt with Patient after receiving referral from [facility]   Patient stated he will call when he is ready to schedule  Not having any issues at the moment   Thanks

## 2019-01-27 NOTE — Telephone Encounter (Signed)
thanks

## 2019-01-28 DIAGNOSIS — Z6822 Body mass index (BMI) 22.0-22.9, adult: Secondary | ICD-10-CM | POA: Diagnosis not present

## 2019-01-28 DIAGNOSIS — E349 Endocrine disorder, unspecified: Secondary | ICD-10-CM | POA: Diagnosis not present

## 2019-01-28 DIAGNOSIS — G950 Syringomyelia and syringobulbia: Secondary | ICD-10-CM | POA: Diagnosis not present

## 2019-01-28 DIAGNOSIS — G904 Autonomic dysreflexia: Secondary | ICD-10-CM | POA: Diagnosis not present

## 2019-01-28 DIAGNOSIS — Z23 Encounter for immunization: Secondary | ICD-10-CM | POA: Diagnosis not present

## 2019-01-28 DIAGNOSIS — I10 Essential (primary) hypertension: Secondary | ICD-10-CM | POA: Diagnosis not present

## 2019-01-28 DIAGNOSIS — I82401 Acute embolism and thrombosis of unspecified deep veins of right lower extremity: Secondary | ICD-10-CM | POA: Diagnosis not present

## 2019-01-28 DIAGNOSIS — G8254 Quadriplegia, C5-C7 incomplete: Secondary | ICD-10-CM | POA: Diagnosis not present

## 2019-01-28 DIAGNOSIS — M545 Low back pain: Secondary | ICD-10-CM | POA: Diagnosis not present

## 2019-01-29 DIAGNOSIS — G8254 Quadriplegia, C5-C7 incomplete: Secondary | ICD-10-CM | POA: Diagnosis not present

## 2019-02-02 ENCOUNTER — Telehealth: Payer: Self-pay | Admitting: Urology

## 2019-02-02 ENCOUNTER — Other Ambulatory Visit: Payer: Self-pay | Admitting: Urology

## 2019-02-02 DIAGNOSIS — N319 Neuromuscular dysfunction of bladder, unspecified: Secondary | ICD-10-CM

## 2019-02-02 DIAGNOSIS — R339 Retention of urine, unspecified: Secondary | ICD-10-CM | POA: Diagnosis not present

## 2019-02-02 MED ORDER — MIRABEGRON ER 50 MG PO TB24: 50.0000 mg | ORAL_TABLET | Freq: Every day | ORAL | 3 refills | Status: DC

## 2019-02-02 NOTE — Telephone Encounter (Signed)
Spoke with patient and clarified his concerns.  He has recently been diagnosed with colon cancer and is not interested in being evaluated by infectious disease.  Mybetriq rx sent to pharmacy.

## 2019-02-02 NOTE — Telephone Encounter (Signed)
Pt called office stating he is confused as to why another clinic has called him to schedule appt with them.  He states Mybetriq that Larene Beach started him on has helped him a lot.  Please call pt to clarify the difference between taking the Mybetriq and outside referral.  Pt has also recently been diagnosed with cancer as well and is feeling overwhelmed [phone].

## 2019-02-03 ENCOUNTER — Telehealth: Payer: Self-pay | Admitting: Urology

## 2019-02-03 NOTE — Telephone Encounter (Signed)
Urine output logs were reviewed and he does over produce urine during sleeping hours.  We can give a trial of Nocdurna however he would need to come in for a baseline serum sodium, serum sodium level at 1 week and 1 month.  He would need to limit his fluid intake after 6 PM due to the risk of hyponatremia.

## 2019-02-03 NOTE — Telephone Encounter (Signed)
Shannon L. Spoke with pt yesterday, he states that the myrbetriq has been very helpful for him. He declines ID referral, as was recently dx with colon cancer and feels now is not a good time.

## 2019-02-05 ENCOUNTER — Telehealth (INDEPENDENT_AMBULATORY_CARE_PROVIDER_SITE_OTHER): Payer: Self-pay | Admitting: Vascular Surgery

## 2019-02-05 NOTE — Telephone Encounter (Signed)
Patient has been made aware with medical advice and verbalized understanding 

## 2019-02-05 NOTE — Telephone Encounter (Signed)
Dr. Lucky Cowboy says the he doesn't have an issue with him taking testosterone as long as he is taking anticoagulation medication.  He should also speak to the doctor prescribing the testosterone to get his thoughts and opinions

## 2019-02-13 ENCOUNTER — Encounter: Payer: Self-pay | Admitting: Oncology

## 2019-02-13 ENCOUNTER — Other Ambulatory Visit: Payer: Self-pay

## 2019-02-13 DIAGNOSIS — I824Y9 Acute embolism and thrombosis of unspecified deep veins of unspecified proximal lower extremity: Secondary | ICD-10-CM

## 2019-02-13 NOTE — Progress Notes (Signed)
Patient pre screened for office appointment, no questions or concerns today. Patient reminded of upcoming appointment time and date. 

## 2019-02-16 ENCOUNTER — Inpatient Hospital Stay: Payer: PPO

## 2019-02-16 ENCOUNTER — Inpatient Hospital Stay: Payer: PPO | Attending: Oncology | Admitting: Oncology

## 2019-02-16 ENCOUNTER — Encounter: Payer: Self-pay | Admitting: Oncology

## 2019-02-16 ENCOUNTER — Other Ambulatory Visit: Payer: Self-pay

## 2019-02-16 VITALS — BP 101/74 | HR 83 | Temp 96.5°F

## 2019-02-16 DIAGNOSIS — Z79899 Other long term (current) drug therapy: Secondary | ICD-10-CM | POA: Insufficient documentation

## 2019-02-16 DIAGNOSIS — I824Y9 Acute embolism and thrombosis of unspecified deep veins of unspecified proximal lower extremity: Secondary | ICD-10-CM | POA: Diagnosis not present

## 2019-02-16 DIAGNOSIS — Z7901 Long term (current) use of anticoagulants: Secondary | ICD-10-CM | POA: Diagnosis not present

## 2019-02-16 DIAGNOSIS — G8250 Quadriplegia, unspecified: Secondary | ICD-10-CM | POA: Insufficient documentation

## 2019-02-16 DIAGNOSIS — Z803 Family history of malignant neoplasm of breast: Secondary | ICD-10-CM | POA: Insufficient documentation

## 2019-02-16 DIAGNOSIS — I10 Essential (primary) hypertension: Secondary | ICD-10-CM | POA: Diagnosis not present

## 2019-02-16 DIAGNOSIS — Z86718 Personal history of other venous thrombosis and embolism: Secondary | ICD-10-CM | POA: Insufficient documentation

## 2019-02-16 LAB — CBC WITH DIFFERENTIAL/PLATELET
Abs Immature Granulocytes: 0.02 10*3/uL (ref 0.00–0.07)
Basophils Absolute: 0 10*3/uL (ref 0.0–0.1)
Basophils Relative: 0 %
Eosinophils Absolute: 0.2 10*3/uL (ref 0.0–0.5)
Eosinophils Relative: 4 %
HCT: 38.2 % — ABNORMAL LOW (ref 39.0–52.0)
Hemoglobin: 13.8 g/dL (ref 13.0–17.0)
Immature Granulocytes: 0 %
Lymphocytes Relative: 21 %
Lymphs Abs: 1.4 10*3/uL (ref 0.7–4.0)
MCH: 32.9 pg (ref 26.0–34.0)
MCHC: 36.1 g/dL — ABNORMAL HIGH (ref 30.0–36.0)
MCV: 91.2 fL (ref 80.0–100.0)
Monocytes Absolute: 0.5 10*3/uL (ref 0.1–1.0)
Monocytes Relative: 7 %
Neutro Abs: 4.5 10*3/uL (ref 1.7–7.7)
Neutrophils Relative %: 68 %
Platelets: 149 10*3/uL — ABNORMAL LOW (ref 150–400)
RBC: 4.19 MIL/uL — ABNORMAL LOW (ref 4.22–5.81)
RDW: 12.1 % (ref 11.5–15.5)
WBC: 6.6 10*3/uL (ref 4.0–10.5)
nRBC: 0 % (ref 0.0–0.2)

## 2019-02-16 LAB — COMPREHENSIVE METABOLIC PANEL
ALT: 17 U/L (ref 0–44)
AST: 23 U/L (ref 15–41)
Albumin: 3.7 g/dL (ref 3.5–5.0)
Alkaline Phosphatase: 50 U/L (ref 38–126)
Anion gap: 7 (ref 5–15)
BUN: 17 mg/dL (ref 8–23)
CO2: 27 mmol/L (ref 22–32)
Calcium: 8.7 mg/dL — ABNORMAL LOW (ref 8.9–10.3)
Chloride: 101 mmol/L (ref 98–111)
Creatinine, Ser: 0.53 mg/dL — ABNORMAL LOW (ref 0.61–1.24)
GFR calc Af Amer: >60 mL/min (ref >60)
GFR calc non Af Amer: >60 mL/min (ref >60)
Glucose, Bld: 100 mg/dL — ABNORMAL HIGH (ref 70–99)
Potassium: 3.8 mmol/L (ref 3.5–5.1)
Sodium: 135 mmol/L (ref 135–145)
Total Bilirubin: 0.9 mg/dL (ref 0.3–1.2)
Total Protein: 6.5 g/dL (ref 6.5–8.1)

## 2019-02-16 NOTE — Progress Notes (Signed)
Patient does not offer any problems today.  
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
## 2019-03-02 DIAGNOSIS — K648 Other hemorrhoids: Secondary | ICD-10-CM | POA: Diagnosis not present

## 2019-03-02 DIAGNOSIS — Z1211 Encounter for screening for malignant neoplasm of colon: Secondary | ICD-10-CM | POA: Diagnosis not present

## 2019-03-02 DIAGNOSIS — K644 Residual hemorrhoidal skin tags: Secondary | ICD-10-CM | POA: Diagnosis not present

## 2019-03-03 DIAGNOSIS — N319 Neuromuscular dysfunction of bladder, unspecified: Secondary | ICD-10-CM | POA: Diagnosis not present

## 2019-03-03 DIAGNOSIS — R339 Retention of urine, unspecified: Secondary | ICD-10-CM | POA: Diagnosis not present

## 2019-03-06 ENCOUNTER — Telehealth: Payer: Self-pay | Admitting: *Deleted

## 2019-03-06 DIAGNOSIS — I82401 Acute embolism and thrombosis of unspecified deep veins of right lower extremity: Secondary | ICD-10-CM | POA: Diagnosis not present

## 2019-03-06 DIAGNOSIS — M7711 Lateral epicondylitis, right elbow: Secondary | ICD-10-CM | POA: Diagnosis not present

## 2019-03-06 DIAGNOSIS — G8254 Quadriplegia, C5-C7 incomplete: Secondary | ICD-10-CM | POA: Diagnosis not present

## 2019-03-06 DIAGNOSIS — G904 Autonomic dysreflexia: Secondary | ICD-10-CM | POA: Diagnosis not present

## 2019-03-06 DIAGNOSIS — G950 Syringomyelia and syringobulbia: Secondary | ICD-10-CM | POA: Diagnosis not present

## 2019-03-06 DIAGNOSIS — Z6822 Body mass index (BMI) 22.0-22.9, adult: Secondary | ICD-10-CM | POA: Diagnosis not present

## 2019-03-06 DIAGNOSIS — I10 Essential (primary) hypertension: Secondary | ICD-10-CM | POA: Diagnosis not present

## 2019-03-06 DIAGNOSIS — M7712 Lateral epicondylitis, left elbow: Secondary | ICD-10-CM | POA: Diagnosis not present

## 2019-03-06 DIAGNOSIS — M545 Low back pain: Secondary | ICD-10-CM | POA: Diagnosis not present

## 2019-03-06 DIAGNOSIS — E349 Endocrine disorder, unspecified: Secondary | ICD-10-CM | POA: Diagnosis not present

## 2019-03-06 NOTE — Telephone Encounter (Signed)
Called Jesse Whitney and discussed patient's case over the phone. Patient requests to be resumed on testosterone therapy. Patient developed VTE while on testosterone, in addition, he also has other risk factors, immobility, male gender.   He has on low-dose maintenance anticoagulation given his high risk of VTE recurrence. In my opinion, testosterone replacement may lead to erythrocytosis which may add additional VTE risk.  I suggest patient to discuss with urology to see if any alternative measures can be used.

## 2019-03-06 NOTE — Telephone Encounter (Signed)
Patient PCP [name], PS with [facility] in [place] call requesting Dr Tasia Catchings to return his call regarding this patients blood clot workup. [phone]

## 2019-03-31 DIAGNOSIS — N319 Neuromuscular dysfunction of bladder, unspecified: Secondary | ICD-10-CM | POA: Diagnosis not present

## 2019-03-31 DIAGNOSIS — R339 Retention of urine, unspecified: Secondary | ICD-10-CM | POA: Diagnosis not present

## 2019-04-09 DIAGNOSIS — M545 Low back pain: Secondary | ICD-10-CM | POA: Diagnosis not present

## 2019-04-09 DIAGNOSIS — G8254 Quadriplegia, C5-C7 incomplete: Secondary | ICD-10-CM | POA: Diagnosis not present

## 2019-04-09 DIAGNOSIS — I82401 Acute embolism and thrombosis of unspecified deep veins of right lower extremity: Secondary | ICD-10-CM | POA: Diagnosis not present

## 2019-04-09 DIAGNOSIS — E349 Endocrine disorder, unspecified: Secondary | ICD-10-CM | POA: Diagnosis not present

## 2019-04-09 DIAGNOSIS — Z79899 Other long term (current) drug therapy: Secondary | ICD-10-CM | POA: Diagnosis not present

## 2019-04-09 DIAGNOSIS — Z6822 Body mass index (BMI) 22.0-22.9, adult: Secondary | ICD-10-CM | POA: Diagnosis not present

## 2019-04-09 DIAGNOSIS — G904 Autonomic dysreflexia: Secondary | ICD-10-CM | POA: Diagnosis not present

## 2019-04-09 DIAGNOSIS — I10 Essential (primary) hypertension: Secondary | ICD-10-CM | POA: Diagnosis not present

## 2019-04-09 DIAGNOSIS — G950 Syringomyelia and syringobulbia: Secondary | ICD-10-CM | POA: Diagnosis not present

## 2019-04-20 ENCOUNTER — Telehealth: Payer: Self-pay | Admitting: Urology

## 2019-04-20 NOTE — Telephone Encounter (Signed)
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 Please advise pt at [phone]. Thanks.

## 2019-04-21 ENCOUNTER — Ambulatory Visit: Payer: PPO | Admitting: Physician Assistant

## 2019-04-21 DIAGNOSIS — N390 Urinary tract infection, site not specified: Secondary | ICD-10-CM | POA: Diagnosis not present

## 2019-05-06 DIAGNOSIS — N319 Neuromuscular dysfunction of bladder, unspecified: Secondary | ICD-10-CM | POA: Diagnosis not present

## 2019-05-06 DIAGNOSIS — R339 Retention of urine, unspecified: Secondary | ICD-10-CM | POA: Diagnosis not present

## 2019-05-12 ENCOUNTER — Ambulatory Visit (INDEPENDENT_AMBULATORY_CARE_PROVIDER_SITE_OTHER): Payer: PPO | Admitting: Vascular Surgery

## 2019-05-18 ENCOUNTER — Inpatient Hospital Stay: Payer: PPO | Admitting: Oncology

## 2019-05-19 ENCOUNTER — Ambulatory Visit (INDEPENDENT_AMBULATORY_CARE_PROVIDER_SITE_OTHER): Payer: PPO | Admitting: Vascular Surgery

## 2019-05-26 DIAGNOSIS — M25512 Pain in left shoulder: Secondary | ICD-10-CM | POA: Diagnosis not present

## 2019-05-26 DIAGNOSIS — G904 Autonomic dysreflexia: Secondary | ICD-10-CM | POA: Diagnosis not present

## 2019-05-26 DIAGNOSIS — M25511 Pain in right shoulder: Secondary | ICD-10-CM | POA: Diagnosis not present

## 2019-05-26 DIAGNOSIS — I82401 Acute embolism and thrombosis of unspecified deep veins of right lower extremity: Secondary | ICD-10-CM | POA: Diagnosis not present

## 2019-05-26 DIAGNOSIS — M545 Low back pain: Secondary | ICD-10-CM | POA: Diagnosis not present

## 2019-05-26 DIAGNOSIS — I10 Essential (primary) hypertension: Secondary | ICD-10-CM | POA: Diagnosis not present

## 2019-05-26 DIAGNOSIS — G8254 Quadriplegia, C5-C7 incomplete: Secondary | ICD-10-CM | POA: Diagnosis not present

## 2019-05-26 DIAGNOSIS — G950 Syringomyelia and syringobulbia: Secondary | ICD-10-CM | POA: Diagnosis not present

## 2019-05-27 ENCOUNTER — Telehealth: Payer: Self-pay | Admitting: *Deleted

## 2019-05-27 NOTE — Telephone Encounter (Signed)
Patient was approved from [employer] [phone] ) Catheter

## 2019-05-29 ENCOUNTER — Telehealth: Payer: Self-pay | Admitting: Oncology

## 2019-05-29 NOTE — Telephone Encounter (Signed)
Patient phoned and stated that he wanted to change his appt to virtual and would need to do this prior to his wife's departure at 1:30 to go to work as he would need his wife's assistance. Appt has been made virtual and moved to 06-04-19.

## 2019-06-02 ENCOUNTER — Other Ambulatory Visit: Payer: Self-pay

## 2019-06-02 ENCOUNTER — Ambulatory Visit (INDEPENDENT_AMBULATORY_CARE_PROVIDER_SITE_OTHER): Payer: PPO | Admitting: Vascular Surgery

## 2019-06-02 VITALS — BP 109/70 | HR 85 | Resp 12

## 2019-06-02 DIAGNOSIS — S14109D Unspecified injury at unspecified level of cervical spinal cord, subsequent encounter: Secondary | ICD-10-CM

## 2019-06-02 DIAGNOSIS — I82411 Acute embolism and thrombosis of right femoral vein: Secondary | ICD-10-CM

## 2019-06-02 NOTE — Assessment & Plan Note (Signed)
With paralysis. stable

## 2019-06-02 NOTE — Assessment & Plan Note (Signed)
The patient is completing appropriate 27-month therapy for extensive right lower extremity DVT with venous intervention.  Minimal postphlebitic symptoms.  Due to his paraplegia, he is at high risk of recurrent thromboembolic events and I would recommend continued anticoagulation although the current literature would support a reduced dose of Eliquis of 2.5 mg twice daily.  I would continue this indefinitely.  He can use compression stockings for postphlebitic symptoms as needed.  Return to clinic on an as-needed basis
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
## 2019-06-04 ENCOUNTER — Encounter: Payer: Self-pay | Admitting: Oncology

## 2019-06-04 ENCOUNTER — Inpatient Hospital Stay: Payer: PPO | Attending: Oncology | Admitting: Oncology

## 2019-06-04 DIAGNOSIS — Z79899 Other long term (current) drug therapy: Secondary | ICD-10-CM | POA: Insufficient documentation

## 2019-06-04 DIAGNOSIS — Z86718 Personal history of other venous thrombosis and embolism: Secondary | ICD-10-CM

## 2019-06-04 DIAGNOSIS — I10 Essential (primary) hypertension: Secondary | ICD-10-CM | POA: Insufficient documentation

## 2019-06-04 DIAGNOSIS — G8250 Quadriplegia, unspecified: Secondary | ICD-10-CM | POA: Insufficient documentation

## 2019-06-04 DIAGNOSIS — Z803 Family history of malignant neoplasm of breast: Secondary | ICD-10-CM | POA: Insufficient documentation

## 2019-06-04 DIAGNOSIS — Z7901 Long term (current) use of anticoagulants: Secondary | ICD-10-CM | POA: Insufficient documentation

## 2019-06-04 NOTE — Progress Notes (Signed)
Patient contacted for Mychart visit. Pt had cortisone shots in shoulders last week.

## 2019-06-05 DIAGNOSIS — N319 Neuromuscular dysfunction of bladder, unspecified: Secondary | ICD-10-CM | POA: Diagnosis not present

## 2019-06-05 DIAGNOSIS — R339 Retention of urine, unspecified: Secondary | ICD-10-CM | POA: Diagnosis not present
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
## 2019-06-08 ENCOUNTER — Ambulatory Visit: Payer: PPO | Admitting: Oncology

## 2019-06-15 DIAGNOSIS — G8254 Quadriplegia, C5-C7 incomplete: Secondary | ICD-10-CM | POA: Diagnosis not present

## 2019-06-22 ENCOUNTER — Ambulatory Visit: Admit: 2019-06-22 | Payer: PPO | Admitting: Internal Medicine

## 2019-06-22 SURGERY — COLONOSCOPY WITH PROPOFOL
Anesthesia: General

## 2019-06-25 DIAGNOSIS — I10 Essential (primary) hypertension: Secondary | ICD-10-CM | POA: Diagnosis not present

## 2019-06-25 DIAGNOSIS — N390 Urinary tract infection, site not specified: Secondary | ICD-10-CM | POA: Diagnosis not present

## 2019-06-25 DIAGNOSIS — G950 Syringomyelia and syringobulbia: Secondary | ICD-10-CM | POA: Diagnosis not present

## 2019-06-25 DIAGNOSIS — M545 Low back pain: Secondary | ICD-10-CM | POA: Diagnosis not present

## 2019-06-25 DIAGNOSIS — G8254 Quadriplegia, C5-C7 incomplete: Secondary | ICD-10-CM | POA: Diagnosis not present

## 2019-06-25 DIAGNOSIS — G904 Autonomic dysreflexia: Secondary | ICD-10-CM | POA: Diagnosis not present

## 2019-06-25 DIAGNOSIS — Z6822 Body mass index (BMI) 22.0-22.9, adult: Secondary | ICD-10-CM | POA: Diagnosis not present

## 2019-06-25 DIAGNOSIS — I82401 Acute embolism and thrombosis of unspecified deep veins of right lower extremity: Secondary | ICD-10-CM | POA: Diagnosis not present

## 2019-07-08 DIAGNOSIS — R339 Retention of urine, unspecified: Secondary | ICD-10-CM | POA: Diagnosis not present

## 2019-07-08 DIAGNOSIS — N319 Neuromuscular dysfunction of bladder, unspecified: Secondary | ICD-10-CM | POA: Diagnosis not present

## 2019-07-16 DIAGNOSIS — G8254 Quadriplegia, C5-C7 incomplete: Secondary | ICD-10-CM | POA: Diagnosis not present

## 2019-07-27 DIAGNOSIS — M545 Low back pain: Secondary | ICD-10-CM | POA: Diagnosis not present

## 2019-07-27 DIAGNOSIS — Z6822 Body mass index (BMI) 22.0-22.9, adult: Secondary | ICD-10-CM | POA: Diagnosis not present

## 2019-07-27 DIAGNOSIS — G8254 Quadriplegia, C5-C7 incomplete: Secondary | ICD-10-CM | POA: Diagnosis not present

## 2019-07-27 DIAGNOSIS — I10 Essential (primary) hypertension: Secondary | ICD-10-CM | POA: Diagnosis not present

## 2019-07-27 DIAGNOSIS — G904 Autonomic dysreflexia: Secondary | ICD-10-CM | POA: Diagnosis not present

## 2019-07-27 DIAGNOSIS — E349 Endocrine disorder, unspecified: Secondary | ICD-10-CM | POA: Diagnosis not present

## 2019-07-27 DIAGNOSIS — I82401 Acute embolism and thrombosis of unspecified deep veins of right lower extremity: Secondary | ICD-10-CM | POA: Diagnosis not present

## 2019-07-27 DIAGNOSIS — G950 Syringomyelia and syringobulbia: Secondary | ICD-10-CM | POA: Diagnosis not present

## 2019-08-03 DIAGNOSIS — Z0000 Encounter for general adult medical examination without abnormal findings: Secondary | ICD-10-CM | POA: Diagnosis not present

## 2019-08-03 DIAGNOSIS — Z1331 Encounter for screening for depression: Secondary | ICD-10-CM | POA: Diagnosis not present

## 2019-08-03 DIAGNOSIS — Z125 Encounter for screening for malignant neoplasm of prostate: Secondary | ICD-10-CM | POA: Diagnosis not present

## 2019-08-03 DIAGNOSIS — Z9181 History of falling: Secondary | ICD-10-CM | POA: Diagnosis not present

## 2019-08-03 DIAGNOSIS — E785 Hyperlipidemia, unspecified: Secondary | ICD-10-CM | POA: Diagnosis not present

## 2019-08-05 DIAGNOSIS — R339 Retention of urine, unspecified: Secondary | ICD-10-CM | POA: Diagnosis not present

## 2019-08-05 DIAGNOSIS — N319 Neuromuscular dysfunction of bladder, unspecified: Secondary | ICD-10-CM | POA: Diagnosis not present

## 2019-08-10 ENCOUNTER — Other Ambulatory Visit (INDEPENDENT_AMBULATORY_CARE_PROVIDER_SITE_OTHER): Payer: Self-pay | Admitting: Nurse Practitioner

## 2019-08-10 ENCOUNTER — Telehealth (INDEPENDENT_AMBULATORY_CARE_PROVIDER_SITE_OTHER): Payer: Self-pay

## 2019-08-10 MED ORDER — ELIQUIS 5 MG PO TABS: 5.0000 mg | ORAL_TABLET | Freq: Two times a day (BID) | ORAL | 2 refills | Status: DC

## 2019-08-10 NOTE — Telephone Encounter (Signed)
He can go back to the 5 mg however there is no indication that will help his swelling.  The patient's leg is likely swelling because of post phlebitic symptoms.  The patient should be wearing compression socks daily and elevating his legs when he can.  If he's not doing these things, whether he is on 5 mg or 2.5 mg of eliquis he will still swell.  So we can do an additional 3 months of eliquis.  If after restarting the 5 mg he continues to have issues with swelling he will need an office visit.

## 2019-08-10 NOTE — Telephone Encounter (Signed)
The pt called about his  Right leg  That he had a previous DVT in on 11/24/18 and was prescribed eliquis 5mg   He now has been taking 2.5 Mg after his post op visit's but has been recently having some swelling in his right leg for about a month with no heat to the touch and would like to know could he be switched back to 5 mg . The pt at this time has no transportation to come into the office because he has no one to load his wheelchair into his car he is no longer able to drive due to a torn rotator cuff that he is getting PT for at home.

## 2019-08-10 NOTE — Telephone Encounter (Signed)
I called and made the pt aware of the Nps instructions °

## 2019-08-13 DIAGNOSIS — E782 Mixed hyperlipidemia: Secondary | ICD-10-CM | POA: Diagnosis not present

## 2019-08-13 DIAGNOSIS — E349 Endocrine disorder, unspecified: Secondary | ICD-10-CM | POA: Diagnosis not present

## 2019-08-13 DIAGNOSIS — Z7901 Long term (current) use of anticoagulants: Secondary | ICD-10-CM | POA: Diagnosis not present

## 2019-08-13 DIAGNOSIS — E049 Nontoxic goiter, unspecified: Secondary | ICD-10-CM | POA: Diagnosis not present

## 2019-08-13 DIAGNOSIS — G4700 Insomnia, unspecified: Secondary | ICD-10-CM | POA: Diagnosis not present

## 2019-08-13 DIAGNOSIS — I10 Essential (primary) hypertension: Secondary | ICD-10-CM | POA: Diagnosis not present

## 2019-08-13 DIAGNOSIS — Z9181 History of falling: Secondary | ICD-10-CM | POA: Diagnosis not present

## 2019-08-13 DIAGNOSIS — Z993 Dependence on wheelchair: Secondary | ICD-10-CM | POA: Diagnosis not present

## 2019-08-13 DIAGNOSIS — G8254 Quadriplegia, C5-C7 incomplete: Secondary | ICD-10-CM | POA: Diagnosis not present

## 2019-08-13 DIAGNOSIS — N319 Neuromuscular dysfunction of bladder, unspecified: Secondary | ICD-10-CM | POA: Diagnosis not present

## 2019-08-13 DIAGNOSIS — I82401 Acute embolism and thrombosis of unspecified deep veins of right lower extremity: Secondary | ICD-10-CM | POA: Diagnosis not present

## 2019-08-13 DIAGNOSIS — G510 Bell's palsy: Secondary | ICD-10-CM | POA: Diagnosis not present

## 2019-08-13 DIAGNOSIS — N400 Enlarged prostate without lower urinary tract symptoms: Secondary | ICD-10-CM | POA: Diagnosis not present

## 2019-08-13 DIAGNOSIS — G904 Autonomic dysreflexia: Secondary | ICD-10-CM | POA: Diagnosis not present

## 2019-08-13 DIAGNOSIS — G950 Syringomyelia and syringobulbia: Secondary | ICD-10-CM | POA: Diagnosis not present

## 2019-08-13 DIAGNOSIS — K219 Gastro-esophageal reflux disease without esophagitis: Secondary | ICD-10-CM | POA: Diagnosis not present

## 2019-08-13 DIAGNOSIS — G8929 Other chronic pain: Secondary | ICD-10-CM | POA: Diagnosis not present

## 2019-08-15 DIAGNOSIS — G8254 Quadriplegia, C5-C7 incomplete: Secondary | ICD-10-CM | POA: Diagnosis not present

## 2019-08-17 DIAGNOSIS — K219 Gastro-esophageal reflux disease without esophagitis: Secondary | ICD-10-CM | POA: Diagnosis not present

## 2019-08-17 DIAGNOSIS — Z7901 Long term (current) use of anticoagulants: Secondary | ICD-10-CM | POA: Diagnosis not present

## 2019-08-17 DIAGNOSIS — N400 Enlarged prostate without lower urinary tract symptoms: Secondary | ICD-10-CM | POA: Diagnosis not present

## 2019-08-17 DIAGNOSIS — I82401 Acute embolism and thrombosis of unspecified deep veins of right lower extremity: Secondary | ICD-10-CM | POA: Diagnosis not present

## 2019-08-17 DIAGNOSIS — E349 Endocrine disorder, unspecified: Secondary | ICD-10-CM | POA: Diagnosis not present

## 2019-08-17 DIAGNOSIS — Z9181 History of falling: Secondary | ICD-10-CM | POA: Diagnosis not present

## 2019-08-17 DIAGNOSIS — G4700 Insomnia, unspecified: Secondary | ICD-10-CM | POA: Diagnosis not present

## 2019-08-17 DIAGNOSIS — G510 Bell's palsy: Secondary | ICD-10-CM | POA: Diagnosis not present

## 2019-08-17 DIAGNOSIS — G950 Syringomyelia and syringobulbia: Secondary | ICD-10-CM | POA: Diagnosis not present

## 2019-08-17 DIAGNOSIS — G8254 Quadriplegia, C5-C7 incomplete: Secondary | ICD-10-CM | POA: Diagnosis not present

## 2019-08-17 DIAGNOSIS — I10 Essential (primary) hypertension: Secondary | ICD-10-CM | POA: Diagnosis not present

## 2019-08-17 DIAGNOSIS — E782 Mixed hyperlipidemia: Secondary | ICD-10-CM | POA: Diagnosis not present

## 2019-08-17 DIAGNOSIS — N319 Neuromuscular dysfunction of bladder, unspecified: Secondary | ICD-10-CM | POA: Diagnosis not present

## 2019-08-17 DIAGNOSIS — G904 Autonomic dysreflexia: Secondary | ICD-10-CM | POA: Diagnosis not present

## 2019-08-17 DIAGNOSIS — Z993 Dependence on wheelchair: Secondary | ICD-10-CM | POA: Diagnosis not present

## 2019-08-17 DIAGNOSIS — E049 Nontoxic goiter, unspecified: Secondary | ICD-10-CM | POA: Diagnosis not present

## 2019-08-17 DIAGNOSIS — G8929 Other chronic pain: Secondary | ICD-10-CM | POA: Diagnosis not present

## 2019-08-24 DIAGNOSIS — E782 Mixed hyperlipidemia: Secondary | ICD-10-CM | POA: Diagnosis not present

## 2019-08-24 DIAGNOSIS — K219 Gastro-esophageal reflux disease without esophagitis: Secondary | ICD-10-CM | POA: Diagnosis not present

## 2019-08-24 DIAGNOSIS — G950 Syringomyelia and syringobulbia: Secondary | ICD-10-CM | POA: Diagnosis not present

## 2019-08-24 DIAGNOSIS — E049 Nontoxic goiter, unspecified: Secondary | ICD-10-CM | POA: Diagnosis not present

## 2019-08-24 DIAGNOSIS — E349 Endocrine disorder, unspecified: Secondary | ICD-10-CM | POA: Diagnosis not present

## 2019-08-24 DIAGNOSIS — I82401 Acute embolism and thrombosis of unspecified deep veins of right lower extremity: Secondary | ICD-10-CM | POA: Diagnosis not present

## 2019-08-24 DIAGNOSIS — G4700 Insomnia, unspecified: Secondary | ICD-10-CM | POA: Diagnosis not present

## 2019-08-24 DIAGNOSIS — I10 Essential (primary) hypertension: Secondary | ICD-10-CM | POA: Diagnosis not present

## 2019-08-24 DIAGNOSIS — G8254 Quadriplegia, C5-C7 incomplete: Secondary | ICD-10-CM | POA: Diagnosis not present

## 2019-08-24 DIAGNOSIS — G510 Bell's palsy: Secondary | ICD-10-CM | POA: Diagnosis not present

## 2019-08-24 DIAGNOSIS — Z993 Dependence on wheelchair: Secondary | ICD-10-CM | POA: Diagnosis not present

## 2019-08-24 DIAGNOSIS — Z9181 History of falling: Secondary | ICD-10-CM | POA: Diagnosis not present

## 2019-08-24 DIAGNOSIS — Z7901 Long term (current) use of anticoagulants: Secondary | ICD-10-CM | POA: Diagnosis not present

## 2019-08-24 DIAGNOSIS — N319 Neuromuscular dysfunction of bladder, unspecified: Secondary | ICD-10-CM | POA: Diagnosis not present

## 2019-08-24 DIAGNOSIS — G8929 Other chronic pain: Secondary | ICD-10-CM | POA: Diagnosis not present

## 2019-08-24 DIAGNOSIS — N400 Enlarged prostate without lower urinary tract symptoms: Secondary | ICD-10-CM | POA: Diagnosis not present

## 2019-08-24 DIAGNOSIS — G904 Autonomic dysreflexia: Secondary | ICD-10-CM | POA: Diagnosis not present

## 2019-09-01 DIAGNOSIS — I10 Essential (primary) hypertension: Secondary | ICD-10-CM | POA: Diagnosis not present

## 2019-09-01 DIAGNOSIS — M25512 Pain in left shoulder: Secondary | ICD-10-CM | POA: Diagnosis not present

## 2019-09-01 DIAGNOSIS — I82401 Acute embolism and thrombosis of unspecified deep veins of right lower extremity: Secondary | ICD-10-CM | POA: Diagnosis not present

## 2019-09-01 DIAGNOSIS — G8254 Quadriplegia, C5-C7 incomplete: Secondary | ICD-10-CM | POA: Diagnosis not present

## 2019-09-01 DIAGNOSIS — G950 Syringomyelia and syringobulbia: Secondary | ICD-10-CM | POA: Diagnosis not present

## 2019-09-01 DIAGNOSIS — M25511 Pain in right shoulder: Secondary | ICD-10-CM | POA: Diagnosis not present

## 2019-09-01 DIAGNOSIS — E349 Endocrine disorder, unspecified: Secondary | ICD-10-CM | POA: Diagnosis not present

## 2019-09-01 DIAGNOSIS — Z6822 Body mass index (BMI) 22.0-22.9, adult: Secondary | ICD-10-CM | POA: Diagnosis not present

## 2019-09-01 DIAGNOSIS — G904 Autonomic dysreflexia: Secondary | ICD-10-CM | POA: Diagnosis not present

## 2019-09-01 DIAGNOSIS — M545 Low back pain: Secondary | ICD-10-CM | POA: Diagnosis not present

## 2019-09-02 DIAGNOSIS — I82401 Acute embolism and thrombosis of unspecified deep veins of right lower extremity: Secondary | ICD-10-CM | POA: Diagnosis not present

## 2019-09-02 DIAGNOSIS — Z993 Dependence on wheelchair: Secondary | ICD-10-CM | POA: Diagnosis not present

## 2019-09-02 DIAGNOSIS — G4700 Insomnia, unspecified: Secondary | ICD-10-CM | POA: Diagnosis not present

## 2019-09-02 DIAGNOSIS — Z9181 History of falling: Secondary | ICD-10-CM | POA: Diagnosis not present

## 2019-09-02 DIAGNOSIS — E782 Mixed hyperlipidemia: Secondary | ICD-10-CM | POA: Diagnosis not present

## 2019-09-02 DIAGNOSIS — G950 Syringomyelia and syringobulbia: Secondary | ICD-10-CM | POA: Diagnosis not present

## 2019-09-02 DIAGNOSIS — G510 Bell's palsy: Secondary | ICD-10-CM | POA: Diagnosis not present

## 2019-09-02 DIAGNOSIS — N319 Neuromuscular dysfunction of bladder, unspecified: Secondary | ICD-10-CM | POA: Diagnosis not present

## 2019-09-02 DIAGNOSIS — G8929 Other chronic pain: Secondary | ICD-10-CM | POA: Diagnosis not present

## 2019-09-02 DIAGNOSIS — E349 Endocrine disorder, unspecified: Secondary | ICD-10-CM | POA: Diagnosis not present

## 2019-09-02 DIAGNOSIS — I10 Essential (primary) hypertension: Secondary | ICD-10-CM | POA: Diagnosis not present

## 2019-09-02 DIAGNOSIS — N400 Enlarged prostate without lower urinary tract symptoms: Secondary | ICD-10-CM | POA: Diagnosis not present

## 2019-09-02 DIAGNOSIS — Z7901 Long term (current) use of anticoagulants: Secondary | ICD-10-CM | POA: Diagnosis not present

## 2019-09-02 DIAGNOSIS — K219 Gastro-esophageal reflux disease without esophagitis: Secondary | ICD-10-CM | POA: Diagnosis not present

## 2019-09-02 DIAGNOSIS — G904 Autonomic dysreflexia: Secondary | ICD-10-CM | POA: Diagnosis not present

## 2019-09-02 DIAGNOSIS — E049 Nontoxic goiter, unspecified: Secondary | ICD-10-CM | POA: Diagnosis not present

## 2019-09-02 DIAGNOSIS — G8254 Quadriplegia, C5-C7 incomplete: Secondary | ICD-10-CM | POA: Diagnosis not present

## 2019-09-03 DIAGNOSIS — R339 Retention of urine, unspecified: Secondary | ICD-10-CM | POA: Diagnosis not present

## 2019-09-03 DIAGNOSIS — N319 Neuromuscular dysfunction of bladder, unspecified: Secondary | ICD-10-CM | POA: Diagnosis not present

## 2019-09-14 DIAGNOSIS — N319 Neuromuscular dysfunction of bladder, unspecified: Secondary | ICD-10-CM | POA: Diagnosis not present

## 2019-09-14 DIAGNOSIS — Z9181 History of falling: Secondary | ICD-10-CM | POA: Diagnosis not present

## 2019-09-14 DIAGNOSIS — E349 Endocrine disorder, unspecified: Secondary | ICD-10-CM | POA: Diagnosis not present

## 2019-09-14 DIAGNOSIS — G4700 Insomnia, unspecified: Secondary | ICD-10-CM | POA: Diagnosis not present

## 2019-09-14 DIAGNOSIS — G950 Syringomyelia and syringobulbia: Secondary | ICD-10-CM | POA: Diagnosis not present

## 2019-09-14 DIAGNOSIS — Z993 Dependence on wheelchair: Secondary | ICD-10-CM | POA: Diagnosis not present

## 2019-09-14 DIAGNOSIS — N400 Enlarged prostate without lower urinary tract symptoms: Secondary | ICD-10-CM | POA: Diagnosis not present

## 2019-09-14 DIAGNOSIS — E049 Nontoxic goiter, unspecified: Secondary | ICD-10-CM | POA: Diagnosis not present

## 2019-09-14 DIAGNOSIS — G510 Bell's palsy: Secondary | ICD-10-CM | POA: Diagnosis not present

## 2019-09-14 DIAGNOSIS — E782 Mixed hyperlipidemia: Secondary | ICD-10-CM | POA: Diagnosis not present

## 2019-09-14 DIAGNOSIS — G904 Autonomic dysreflexia: Secondary | ICD-10-CM | POA: Diagnosis not present

## 2019-09-14 DIAGNOSIS — G8929 Other chronic pain: Secondary | ICD-10-CM | POA: Diagnosis not present

## 2019-09-14 DIAGNOSIS — G8254 Quadriplegia, C5-C7 incomplete: Secondary | ICD-10-CM | POA: Diagnosis not present

## 2019-09-14 DIAGNOSIS — I10 Essential (primary) hypertension: Secondary | ICD-10-CM | POA: Diagnosis not present

## 2019-09-14 DIAGNOSIS — K219 Gastro-esophageal reflux disease without esophagitis: Secondary | ICD-10-CM | POA: Diagnosis not present

## 2019-09-14 DIAGNOSIS — I82401 Acute embolism and thrombosis of unspecified deep veins of right lower extremity: Secondary | ICD-10-CM | POA: Diagnosis not present

## 2019-09-14 DIAGNOSIS — Z7901 Long term (current) use of anticoagulants: Secondary | ICD-10-CM | POA: Diagnosis not present

## 2019-09-15 DIAGNOSIS — G8254 Quadriplegia, C5-C7 incomplete: Secondary | ICD-10-CM | POA: Diagnosis not present

## 2019-09-16 DIAGNOSIS — K219 Gastro-esophageal reflux disease without esophagitis: Secondary | ICD-10-CM | POA: Diagnosis not present

## 2019-09-16 DIAGNOSIS — N400 Enlarged prostate without lower urinary tract symptoms: Secondary | ICD-10-CM | POA: Diagnosis not present

## 2019-09-16 DIAGNOSIS — I10 Essential (primary) hypertension: Secondary | ICD-10-CM | POA: Diagnosis not present

## 2019-09-16 DIAGNOSIS — Z993 Dependence on wheelchair: Secondary | ICD-10-CM | POA: Diagnosis not present

## 2019-09-16 DIAGNOSIS — G8254 Quadriplegia, C5-C7 incomplete: Secondary | ICD-10-CM | POA: Diagnosis not present

## 2019-09-16 DIAGNOSIS — G904 Autonomic dysreflexia: Secondary | ICD-10-CM | POA: Diagnosis not present

## 2019-09-16 DIAGNOSIS — Z9181 History of falling: Secondary | ICD-10-CM | POA: Diagnosis not present

## 2019-09-16 DIAGNOSIS — N319 Neuromuscular dysfunction of bladder, unspecified: Secondary | ICD-10-CM | POA: Diagnosis not present

## 2019-09-16 DIAGNOSIS — G8929 Other chronic pain: Secondary | ICD-10-CM | POA: Diagnosis not present

## 2019-09-16 DIAGNOSIS — E049 Nontoxic goiter, unspecified: Secondary | ICD-10-CM | POA: Diagnosis not present

## 2019-09-16 DIAGNOSIS — G510 Bell's palsy: Secondary | ICD-10-CM | POA: Diagnosis not present

## 2019-09-16 DIAGNOSIS — E782 Mixed hyperlipidemia: Secondary | ICD-10-CM | POA: Diagnosis not present

## 2019-09-16 DIAGNOSIS — G950 Syringomyelia and syringobulbia: Secondary | ICD-10-CM | POA: Diagnosis not present

## 2019-09-16 DIAGNOSIS — I82401 Acute embolism and thrombosis of unspecified deep veins of right lower extremity: Secondary | ICD-10-CM | POA: Diagnosis not present

## 2019-09-16 DIAGNOSIS — G4700 Insomnia, unspecified: Secondary | ICD-10-CM | POA: Diagnosis not present

## 2019-09-16 DIAGNOSIS — Z7901 Long term (current) use of anticoagulants: Secondary | ICD-10-CM | POA: Diagnosis not present

## 2019-09-16 DIAGNOSIS — E349 Endocrine disorder, unspecified: Secondary | ICD-10-CM | POA: Diagnosis not present

## 2019-10-06 DIAGNOSIS — N319 Neuromuscular dysfunction of bladder, unspecified: Secondary | ICD-10-CM | POA: Diagnosis not present

## 2019-10-06 DIAGNOSIS — R339 Retention of urine, unspecified: Secondary | ICD-10-CM | POA: Diagnosis not present

## 2019-10-15 DIAGNOSIS — Z79899 Other long term (current) drug therapy: Secondary | ICD-10-CM | POA: Diagnosis not present

## 2019-10-15 DIAGNOSIS — I10 Essential (primary) hypertension: Secondary | ICD-10-CM | POA: Diagnosis not present

## 2019-10-15 DIAGNOSIS — I82401 Acute embolism and thrombosis of unspecified deep veins of right lower extremity: Secondary | ICD-10-CM | POA: Diagnosis not present

## 2019-10-15 DIAGNOSIS — G950 Syringomyelia and syringobulbia: Secondary | ICD-10-CM | POA: Diagnosis not present

## 2019-10-15 DIAGNOSIS — H6122 Impacted cerumen, left ear: Secondary | ICD-10-CM | POA: Diagnosis not present

## 2019-10-15 DIAGNOSIS — E349 Endocrine disorder, unspecified: Secondary | ICD-10-CM | POA: Diagnosis not present

## 2019-10-15 DIAGNOSIS — M545 Low back pain: Secondary | ICD-10-CM | POA: Diagnosis not present

## 2019-10-15 DIAGNOSIS — G904 Autonomic dysreflexia: Secondary | ICD-10-CM | POA: Diagnosis not present

## 2019-10-15 DIAGNOSIS — Z125 Encounter for screening for malignant neoplasm of prostate: Secondary | ICD-10-CM | POA: Diagnosis not present

## 2019-10-15 DIAGNOSIS — G8254 Quadriplegia, C5-C7 incomplete: Secondary | ICD-10-CM | POA: Diagnosis not present

## 2019-10-15 DIAGNOSIS — Z6822 Body mass index (BMI) 22.0-22.9, adult: Secondary | ICD-10-CM | POA: Diagnosis not present

## 2019-11-03 DIAGNOSIS — N319 Neuromuscular dysfunction of bladder, unspecified: Secondary | ICD-10-CM | POA: Diagnosis not present

## 2019-11-03 DIAGNOSIS — R339 Retention of urine, unspecified: Secondary | ICD-10-CM | POA: Diagnosis not present

## 2019-11-04 DIAGNOSIS — G8254 Quadriplegia, C5-C7 incomplete: Secondary | ICD-10-CM | POA: Diagnosis not present

## 2019-11-04 DIAGNOSIS — G4710 Hypersomnia, unspecified: Secondary | ICD-10-CM | POA: Diagnosis not present

## 2019-11-04 DIAGNOSIS — Z6822 Body mass index (BMI) 22.0-22.9, adult: Secondary | ICD-10-CM | POA: Diagnosis not present

## 2019-11-04 DIAGNOSIS — E349 Endocrine disorder, unspecified: Secondary | ICD-10-CM | POA: Diagnosis not present

## 2019-11-15 DIAGNOSIS — G8254 Quadriplegia, C5-C7 incomplete: Secondary | ICD-10-CM | POA: Diagnosis not present

## 2019-11-19 DIAGNOSIS — M545 Low back pain: Secondary | ICD-10-CM | POA: Diagnosis not present

## 2019-11-19 DIAGNOSIS — E349 Endocrine disorder, unspecified: Secondary | ICD-10-CM | POA: Diagnosis not present

## 2019-11-19 DIAGNOSIS — G904 Autonomic dysreflexia: Secondary | ICD-10-CM | POA: Diagnosis not present

## 2019-11-19 DIAGNOSIS — G8254 Quadriplegia, C5-C7 incomplete: Secondary | ICD-10-CM | POA: Diagnosis not present

## 2019-11-19 DIAGNOSIS — I10 Essential (primary) hypertension: Secondary | ICD-10-CM | POA: Diagnosis not present

## 2019-11-19 DIAGNOSIS — G950 Syringomyelia and syringobulbia: Secondary | ICD-10-CM | POA: Diagnosis not present

## 2019-11-19 DIAGNOSIS — I82401 Acute embolism and thrombosis of unspecified deep veins of right lower extremity: Secondary | ICD-10-CM | POA: Diagnosis not present

## 2019-11-19 DIAGNOSIS — Z79899 Other long term (current) drug therapy: Secondary | ICD-10-CM | POA: Diagnosis not present

## 2019-12-03 DIAGNOSIS — N319 Neuromuscular dysfunction of bladder, unspecified: Secondary | ICD-10-CM | POA: Diagnosis not present

## 2019-12-03 DIAGNOSIS — R339 Retention of urine, unspecified: Secondary | ICD-10-CM | POA: Diagnosis not present

## 2019-12-14 DIAGNOSIS — Z6822 Body mass index (BMI) 22.0-22.9, adult: Secondary | ICD-10-CM | POA: Diagnosis not present

## 2019-12-14 DIAGNOSIS — G8254 Quadriplegia, C5-C7 incomplete: Secondary | ICD-10-CM | POA: Diagnosis not present

## 2019-12-14 DIAGNOSIS — M25511 Pain in right shoulder: Secondary | ICD-10-CM | POA: Diagnosis not present

## 2019-12-14 DIAGNOSIS — M25512 Pain in left shoulder: Secondary | ICD-10-CM | POA: Diagnosis not present

## 2019-12-16 DIAGNOSIS — G8254 Quadriplegia, C5-C7 incomplete: Secondary | ICD-10-CM | POA: Diagnosis not present

## 2019-12-29 DIAGNOSIS — R339 Retention of urine, unspecified: Secondary | ICD-10-CM | POA: Diagnosis not present

## 2019-12-29 DIAGNOSIS — Z6822 Body mass index (BMI) 22.0-22.9, adult: Secondary | ICD-10-CM | POA: Diagnosis not present

## 2019-12-29 DIAGNOSIS — N390 Urinary tract infection, site not specified: Secondary | ICD-10-CM | POA: Diagnosis not present

## 2019-12-29 DIAGNOSIS — G904 Autonomic dysreflexia: Secondary | ICD-10-CM | POA: Diagnosis not present

## 2019-12-29 DIAGNOSIS — N319 Neuromuscular dysfunction of bladder, unspecified: Secondary | ICD-10-CM | POA: Diagnosis not present

## 2019-12-29 DIAGNOSIS — G950 Syringomyelia and syringobulbia: Secondary | ICD-10-CM | POA: Diagnosis not present

## 2019-12-29 DIAGNOSIS — I82401 Acute embolism and thrombosis of unspecified deep veins of right lower extremity: Secondary | ICD-10-CM | POA: Diagnosis not present

## 2019-12-29 DIAGNOSIS — M545 Low back pain: Secondary | ICD-10-CM | POA: Diagnosis not present

## 2019-12-29 DIAGNOSIS — Z23 Encounter for immunization: Secondary | ICD-10-CM | POA: Diagnosis not present

## 2019-12-29 DIAGNOSIS — G8254 Quadriplegia, C5-C7 incomplete: Secondary | ICD-10-CM | POA: Diagnosis not present

## 2019-12-29 DIAGNOSIS — E349 Endocrine disorder, unspecified: Secondary | ICD-10-CM | POA: Diagnosis not present

## 2019-12-29 DIAGNOSIS — I10 Essential (primary) hypertension: Secondary | ICD-10-CM | POA: Diagnosis not present

## 2019-12-30 DIAGNOSIS — G8254 Quadriplegia, C5-C7 incomplete: Secondary | ICD-10-CM | POA: Diagnosis not present

## 2020-01-15 DIAGNOSIS — G8254 Quadriplegia, C5-C7 incomplete: Secondary | ICD-10-CM | POA: Diagnosis not present

## 2020-01-26 DIAGNOSIS — N319 Neuromuscular dysfunction of bladder, unspecified: Secondary | ICD-10-CM | POA: Diagnosis not present

## 2020-01-26 DIAGNOSIS — R339 Retention of urine, unspecified: Secondary | ICD-10-CM | POA: Diagnosis not present

## 2020-02-09 DIAGNOSIS — I82401 Acute embolism and thrombosis of unspecified deep veins of right lower extremity: Secondary | ICD-10-CM | POA: Diagnosis not present

## 2020-02-09 DIAGNOSIS — I10 Essential (primary) hypertension: Secondary | ICD-10-CM | POA: Diagnosis not present

## 2020-02-09 DIAGNOSIS — M545 Low back pain, unspecified: Secondary | ICD-10-CM | POA: Diagnosis not present

## 2020-02-09 DIAGNOSIS — G904 Autonomic dysreflexia: Secondary | ICD-10-CM | POA: Diagnosis not present

## 2020-02-09 DIAGNOSIS — G950 Syringomyelia and syringobulbia: Secondary | ICD-10-CM | POA: Diagnosis not present

## 2020-02-09 DIAGNOSIS — E349 Endocrine disorder, unspecified: Secondary | ICD-10-CM | POA: Diagnosis not present

## 2020-02-09 DIAGNOSIS — G8254 Quadriplegia, C5-C7 incomplete: Secondary | ICD-10-CM | POA: Diagnosis not present

## 2020-02-09 DIAGNOSIS — Z6822 Body mass index (BMI) 22.0-22.9, adult: Secondary | ICD-10-CM | POA: Diagnosis not present

## 2020-02-11 ENCOUNTER — Ambulatory Visit: Payer: PPO | Admitting: Urology

## 2020-02-15 DIAGNOSIS — G8254 Quadriplegia, C5-C7 incomplete: Secondary | ICD-10-CM | POA: Diagnosis not present

## 2020-02-18 DIAGNOSIS — N319 Neuromuscular dysfunction of bladder, unspecified: Secondary | ICD-10-CM | POA: Diagnosis not present

## 2020-02-18 DIAGNOSIS — R339 Retention of urine, unspecified: Secondary | ICD-10-CM | POA: Diagnosis not present

## 2020-03-07 ENCOUNTER — Ambulatory Visit (INDEPENDENT_AMBULATORY_CARE_PROVIDER_SITE_OTHER): Payer: PPO | Admitting: Nurse Practitioner

## 2020-03-07 ENCOUNTER — Other Ambulatory Visit: Payer: Self-pay

## 2020-03-07 ENCOUNTER — Encounter (INDEPENDENT_AMBULATORY_CARE_PROVIDER_SITE_OTHER): Payer: Self-pay | Admitting: Nurse Practitioner

## 2020-03-07 VITALS — BP 106/68 | HR 86 | Ht 68.0 in | Wt 150.0 lb

## 2020-03-07 DIAGNOSIS — I890 Lymphedema, not elsewhere classified: Secondary | ICD-10-CM | POA: Diagnosis not present

## 2020-03-09 ENCOUNTER — Telehealth (INDEPENDENT_AMBULATORY_CARE_PROVIDER_SITE_OTHER): Payer: Self-pay

## 2020-03-09 NOTE — Telephone Encounter (Signed)
Patient left a voicemail stating that he was seen on Monday and had a few more questions pertaining to his visit. The patient informed that he has been using Testerone injections for 20 years and also he drinks about two beers a week and drinks wine occassionally when he is out for gatherings. The patient would like to know if the injection or alcohol will cause the swelling. I spoke with Sheppard Plumber NP and she advise that it is safe to continue the injections and for the moderation of drinks is okay. The patient was made aware with medical recommendations and verbalized understanding.

## 2020-03-11 DIAGNOSIS — M25511 Pain in right shoulder: Secondary | ICD-10-CM | POA: Diagnosis not present

## 2020-03-11 DIAGNOSIS — M25512 Pain in left shoulder: Secondary | ICD-10-CM | POA: Diagnosis not present

## 2020-03-11 DIAGNOSIS — I82401 Acute embolism and thrombosis of unspecified deep veins of right lower extremity: Secondary | ICD-10-CM | POA: Diagnosis not present

## 2020-03-11 DIAGNOSIS — Z6822 Body mass index (BMI) 22.0-22.9, adult: Secondary | ICD-10-CM | POA: Diagnosis not present

## 2020-03-11 DIAGNOSIS — G950 Syringomyelia and syringobulbia: Secondary | ICD-10-CM | POA: Diagnosis not present

## 2020-03-11 DIAGNOSIS — M545 Low back pain, unspecified: Secondary | ICD-10-CM | POA: Diagnosis not present

## 2020-03-11 DIAGNOSIS — E349 Endocrine disorder, unspecified: Secondary | ICD-10-CM | POA: Diagnosis not present

## 2020-03-11 DIAGNOSIS — I10 Essential (primary) hypertension: Secondary | ICD-10-CM | POA: Diagnosis not present

## 2020-03-11 DIAGNOSIS — G8254 Quadriplegia, C5-C7 incomplete: Secondary | ICD-10-CM | POA: Diagnosis not present

## 2020-03-11 DIAGNOSIS — G904 Autonomic dysreflexia: Secondary | ICD-10-CM | POA: Diagnosis not present

## 2020-03-13 ENCOUNTER — Encounter (INDEPENDENT_AMBULATORY_CARE_PROVIDER_SITE_OTHER): Payer: Self-pay | Admitting: Nurse Practitioner
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
## 2020-03-16 DIAGNOSIS — G8254 Quadriplegia, C5-C7 incomplete: Secondary | ICD-10-CM | POA: Diagnosis not present

## 2020-03-18 DIAGNOSIS — R339 Retention of urine, unspecified: Secondary | ICD-10-CM | POA: Diagnosis not present

## 2020-03-18 DIAGNOSIS — N319 Neuromuscular dysfunction of bladder, unspecified: Secondary | ICD-10-CM | POA: Diagnosis not present

## 2020-03-31 ENCOUNTER — Ambulatory Visit (INDEPENDENT_AMBULATORY_CARE_PROVIDER_SITE_OTHER): Payer: PPO | Admitting: Nurse Practitioner

## 2020-03-31 ENCOUNTER — Encounter (INDEPENDENT_AMBULATORY_CARE_PROVIDER_SITE_OTHER): Payer: Self-pay | Admitting: Nurse Practitioner

## 2020-03-31 ENCOUNTER — Other Ambulatory Visit: Payer: Self-pay

## 2020-03-31 VITALS — BP 114/79 | HR 79 | Resp 17 | Ht 67.0 in | Wt 150.0 lb

## 2020-03-31 DIAGNOSIS — S14109D Unspecified injury at unspecified level of cervical spinal cord, subsequent encounter: Secondary | ICD-10-CM

## 2020-03-31 DIAGNOSIS — I890 Lymphedema, not elsewhere classified: Secondary | ICD-10-CM

## 2020-03-31 MED ORDER — DOXYCYCLINE HYCLATE 100 MG PO CAPS: 100.0000 mg | ORAL_CAPSULE | Freq: Two times a day (BID) | ORAL | 0 refills | Status: DC
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
## 2020-04-04 ENCOUNTER — Other Ambulatory Visit (INDEPENDENT_AMBULATORY_CARE_PROVIDER_SITE_OTHER): Payer: Self-pay | Admitting: Nurse Practitioner

## 2020-04-04 ENCOUNTER — Encounter (INDEPENDENT_AMBULATORY_CARE_PROVIDER_SITE_OTHER): Payer: PPO

## 2020-04-04 ENCOUNTER — Ambulatory Visit (INDEPENDENT_AMBULATORY_CARE_PROVIDER_SITE_OTHER): Payer: PPO | Admitting: Nurse Practitioner

## 2020-04-04 DIAGNOSIS — I890 Lymphedema, not elsewhere classified: Secondary | ICD-10-CM

## 2020-04-12 DIAGNOSIS — Z79899 Other long term (current) drug therapy: Secondary | ICD-10-CM | POA: Diagnosis not present

## 2020-04-12 DIAGNOSIS — Z6822 Body mass index (BMI) 22.0-22.9, adult: Secondary | ICD-10-CM | POA: Diagnosis not present

## 2020-04-12 DIAGNOSIS — G950 Syringomyelia and syringobulbia: Secondary | ICD-10-CM | POA: Diagnosis not present

## 2020-04-12 DIAGNOSIS — I10 Essential (primary) hypertension: Secondary | ICD-10-CM | POA: Diagnosis not present

## 2020-04-12 DIAGNOSIS — I82401 Acute embolism and thrombosis of unspecified deep veins of right lower extremity: Secondary | ICD-10-CM | POA: Diagnosis not present

## 2020-04-12 DIAGNOSIS — N390 Urinary tract infection, site not specified: Secondary | ICD-10-CM | POA: Diagnosis not present

## 2020-04-12 DIAGNOSIS — G8254 Quadriplegia, C5-C7 incomplete: Secondary | ICD-10-CM | POA: Diagnosis not present

## 2020-04-12 DIAGNOSIS — G904 Autonomic dysreflexia: Secondary | ICD-10-CM | POA: Diagnosis not present

## 2020-04-12 DIAGNOSIS — M545 Low back pain, unspecified: Secondary | ICD-10-CM | POA: Diagnosis not present

## 2020-04-12 DIAGNOSIS — E349 Endocrine disorder, unspecified: Secondary | ICD-10-CM | POA: Diagnosis not present

## 2020-04-16 DIAGNOSIS — G8254 Quadriplegia, C5-C7 incomplete: Secondary | ICD-10-CM | POA: Diagnosis not present

## 2020-04-21 DIAGNOSIS — R339 Retention of urine, unspecified: Secondary | ICD-10-CM | POA: Diagnosis not present

## 2020-04-21 DIAGNOSIS — N319 Neuromuscular dysfunction of bladder, unspecified: Secondary | ICD-10-CM | POA: Diagnosis not present

## 2020-04-27 ENCOUNTER — Ambulatory Visit (INDEPENDENT_AMBULATORY_CARE_PROVIDER_SITE_OTHER): Payer: PPO | Admitting: Nurse Practitioner

## 2020-04-27 ENCOUNTER — Encounter (INDEPENDENT_AMBULATORY_CARE_PROVIDER_SITE_OTHER): Payer: PPO

## 2020-05-16 DIAGNOSIS — I10 Essential (primary) hypertension: Secondary | ICD-10-CM | POA: Diagnosis not present

## 2020-05-16 DIAGNOSIS — G904 Autonomic dysreflexia: Secondary | ICD-10-CM | POA: Diagnosis not present

## 2020-05-16 DIAGNOSIS — N390 Urinary tract infection, site not specified: Secondary | ICD-10-CM | POA: Diagnosis not present

## 2020-05-16 DIAGNOSIS — M545 Low back pain, unspecified: Secondary | ICD-10-CM | POA: Diagnosis not present

## 2020-05-16 DIAGNOSIS — G950 Syringomyelia and syringobulbia: Secondary | ICD-10-CM | POA: Diagnosis not present

## 2020-05-16 DIAGNOSIS — G8254 Quadriplegia, C5-C7 incomplete: Secondary | ICD-10-CM | POA: Diagnosis not present

## 2020-05-16 DIAGNOSIS — R350 Frequency of micturition: Secondary | ICD-10-CM | POA: Diagnosis not present

## 2020-05-16 DIAGNOSIS — I82401 Acute embolism and thrombosis of unspecified deep veins of right lower extremity: Secondary | ICD-10-CM | POA: Diagnosis not present

## 2020-05-16 DIAGNOSIS — M25511 Pain in right shoulder: Secondary | ICD-10-CM | POA: Diagnosis not present

## 2020-05-16 DIAGNOSIS — Z6822 Body mass index (BMI) 22.0-22.9, adult: Secondary | ICD-10-CM | POA: Diagnosis not present

## 2020-05-16 DIAGNOSIS — E349 Endocrine disorder, unspecified: Secondary | ICD-10-CM | POA: Diagnosis not present

## 2020-05-16 DIAGNOSIS — N319 Neuromuscular dysfunction of bladder, unspecified: Secondary | ICD-10-CM | POA: Diagnosis not present

## 2020-05-16 DIAGNOSIS — Z79899 Other long term (current) drug therapy: Secondary | ICD-10-CM | POA: Diagnosis not present

## 2020-05-17 DIAGNOSIS — G8254 Quadriplegia, C5-C7 incomplete: Secondary | ICD-10-CM | POA: Diagnosis not present

## 2020-05-19 DIAGNOSIS — R339 Retention of urine, unspecified: Secondary | ICD-10-CM | POA: Diagnosis not present

## 2020-05-19 DIAGNOSIS — N319 Neuromuscular dysfunction of bladder, unspecified: Secondary | ICD-10-CM | POA: Diagnosis not present

## 2020-05-27 DIAGNOSIS — M7581 Other shoulder lesions, right shoulder: Secondary | ICD-10-CM | POA: Diagnosis not present

## 2020-05-27 DIAGNOSIS — Z6822 Body mass index (BMI) 22.0-22.9, adult: Secondary | ICD-10-CM | POA: Diagnosis not present

## 2020-06-17 DIAGNOSIS — Z6822 Body mass index (BMI) 22.0-22.9, adult: Secondary | ICD-10-CM | POA: Diagnosis not present

## 2020-06-17 DIAGNOSIS — G904 Autonomic dysreflexia: Secondary | ICD-10-CM | POA: Diagnosis not present

## 2020-06-17 DIAGNOSIS — M545 Low back pain, unspecified: Secondary | ICD-10-CM | POA: Diagnosis not present

## 2020-06-17 DIAGNOSIS — E349 Endocrine disorder, unspecified: Secondary | ICD-10-CM | POA: Diagnosis not present

## 2020-06-17 DIAGNOSIS — I82401 Acute embolism and thrombosis of unspecified deep veins of right lower extremity: Secondary | ICD-10-CM | POA: Diagnosis not present

## 2020-06-17 DIAGNOSIS — G8254 Quadriplegia, C5-C7 incomplete: Secondary | ICD-10-CM | POA: Diagnosis not present

## 2020-06-17 DIAGNOSIS — L8991 Pressure ulcer of unspecified site, stage 1: Secondary | ICD-10-CM | POA: Diagnosis not present

## 2020-06-17 DIAGNOSIS — I10 Essential (primary) hypertension: Secondary | ICD-10-CM | POA: Diagnosis not present

## 2020-06-17 DIAGNOSIS — G950 Syringomyelia and syringobulbia: Secondary | ICD-10-CM | POA: Diagnosis not present

## 2020-06-17 DIAGNOSIS — N319 Neuromuscular dysfunction of bladder, unspecified: Secondary | ICD-10-CM | POA: Diagnosis not present

## 2020-07-14 ENCOUNTER — Ambulatory Visit: Payer: Self-pay | Admitting: Urology

## 2020-07-19 DIAGNOSIS — Z6822 Body mass index (BMI) 22.0-22.9, adult: Secondary | ICD-10-CM | POA: Diagnosis not present

## 2020-07-19 DIAGNOSIS — E349 Endocrine disorder, unspecified: Secondary | ICD-10-CM | POA: Diagnosis not present

## 2020-07-19 DIAGNOSIS — I82401 Acute embolism and thrombosis of unspecified deep veins of right lower extremity: Secondary | ICD-10-CM | POA: Diagnosis not present

## 2020-07-19 DIAGNOSIS — G950 Syringomyelia and syringobulbia: Secondary | ICD-10-CM | POA: Diagnosis not present

## 2020-07-19 DIAGNOSIS — M545 Low back pain, unspecified: Secondary | ICD-10-CM | POA: Diagnosis not present

## 2020-07-19 DIAGNOSIS — G8254 Quadriplegia, C5-C7 incomplete: Secondary | ICD-10-CM | POA: Diagnosis not present

## 2020-07-19 DIAGNOSIS — G904 Autonomic dysreflexia: Secondary | ICD-10-CM | POA: Diagnosis not present

## 2020-07-19 DIAGNOSIS — I10 Essential (primary) hypertension: Secondary | ICD-10-CM | POA: Diagnosis not present

## 2020-07-19 DIAGNOSIS — N319 Neuromuscular dysfunction of bladder, unspecified: Secondary | ICD-10-CM | POA: Diagnosis not present

## 2020-07-21 DIAGNOSIS — N319 Neuromuscular dysfunction of bladder, unspecified: Secondary | ICD-10-CM | POA: Diagnosis not present

## 2020-07-21 DIAGNOSIS — R339 Retention of urine, unspecified: Secondary | ICD-10-CM | POA: Diagnosis not present

## 2020-07-27 ENCOUNTER — Ambulatory Visit: Payer: Self-pay | Admitting: Urology
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
## 2020-08-01 ENCOUNTER — Ambulatory Visit: Payer: PPO | Admitting: Urology

## 2020-08-01 ENCOUNTER — Other Ambulatory Visit: Payer: Self-pay

## 2020-08-01 ENCOUNTER — Encounter: Payer: Self-pay | Admitting: Urology

## 2020-08-01 VITALS — BP 130/74 | HR 84 | Ht 67.0 in | Wt 150.0 lb

## 2020-08-01 DIAGNOSIS — N319 Neuromuscular dysfunction of bladder, unspecified: Secondary | ICD-10-CM

## 2020-08-02 ENCOUNTER — Encounter: Payer: Self-pay | Admitting: Urology

## 2020-08-02 LAB — MICROSCOPIC EXAMINATION
Bacteria, UA: NONE SEEN
Epithelial Cells (non renal): NONE SEEN /hpf (ref 0–10)

## 2020-08-02 LAB — URINALYSIS, COMPLETE
Bilirubin, UA: NEGATIVE
Glucose, UA: NEGATIVE
Ketones, UA: NEGATIVE
Nitrite, UA: NEGATIVE
Protein,UA: NEGATIVE
RBC, UA: NEGATIVE
Specific Gravity, UA: 1.01 (ref 1.005–1.030)
Urobilinogen, Ur: 0.2 mg/dL (ref 0.2–1.0)
pH, UA: 7 (ref 5.0–7.5)

## 2020-08-04 DIAGNOSIS — Z0000 Encounter for general adult medical examination without abnormal findings: Secondary | ICD-10-CM | POA: Diagnosis not present

## 2020-08-04 DIAGNOSIS — E785 Hyperlipidemia, unspecified: Secondary | ICD-10-CM | POA: Diagnosis not present

## 2020-08-04 DIAGNOSIS — Z1331 Encounter for screening for depression: Secondary | ICD-10-CM | POA: Diagnosis not present

## 2020-08-04 DIAGNOSIS — Z9181 History of falling: Secondary | ICD-10-CM | POA: Diagnosis not present

## 2020-08-15 DIAGNOSIS — I82401 Acute embolism and thrombosis of unspecified deep veins of right lower extremity: Secondary | ICD-10-CM | POA: Diagnosis not present

## 2020-08-15 DIAGNOSIS — N319 Neuromuscular dysfunction of bladder, unspecified: Secondary | ICD-10-CM | POA: Diagnosis not present

## 2020-08-15 DIAGNOSIS — G8254 Quadriplegia, C5-C7 incomplete: Secondary | ICD-10-CM | POA: Diagnosis not present

## 2020-08-15 DIAGNOSIS — M25511 Pain in right shoulder: Secondary | ICD-10-CM | POA: Diagnosis not present

## 2020-08-15 DIAGNOSIS — N390 Urinary tract infection, site not specified: Secondary | ICD-10-CM | POA: Diagnosis not present

## 2020-08-15 DIAGNOSIS — M25512 Pain in left shoulder: Secondary | ICD-10-CM | POA: Diagnosis not present

## 2020-08-15 DIAGNOSIS — E349 Endocrine disorder, unspecified: Secondary | ICD-10-CM | POA: Diagnosis not present

## 2020-08-15 DIAGNOSIS — M545 Low back pain, unspecified: Secondary | ICD-10-CM | POA: Diagnosis not present

## 2020-08-15 DIAGNOSIS — G950 Syringomyelia and syringobulbia: Secondary | ICD-10-CM | POA: Diagnosis not present

## 2020-08-15 DIAGNOSIS — I10 Essential (primary) hypertension: Secondary | ICD-10-CM | POA: Diagnosis not present

## 2020-08-15 DIAGNOSIS — Z6822 Body mass index (BMI) 22.0-22.9, adult: Secondary | ICD-10-CM | POA: Diagnosis not present

## 2020-08-15 DIAGNOSIS — G904 Autonomic dysreflexia: Secondary | ICD-10-CM | POA: Diagnosis not present

## 2020-09-05 DIAGNOSIS — R339 Retention of urine, unspecified: Secondary | ICD-10-CM | POA: Diagnosis not present

## 2020-09-05 DIAGNOSIS — N319 Neuromuscular dysfunction of bladder, unspecified: Secondary | ICD-10-CM | POA: Diagnosis not present

## 2020-09-06 ENCOUNTER — Ambulatory Visit (INDEPENDENT_AMBULATORY_CARE_PROVIDER_SITE_OTHER): Payer: PPO | Admitting: Vascular Surgery

## 2020-09-09 IMAGING — US RIGHT LOWER EXTREMITY VENOUS ULTRASOUND
1 series · 13 of 24 positions shown · non-contrast
Comparison: None.

CLINICAL DATA: Right lower extremity edema for 2 weeks, paraplegia



[Series 1: right lower extremity venous ultrasound · 0.07mm/px · 13 of 38 slices shown]
[im 1/38]
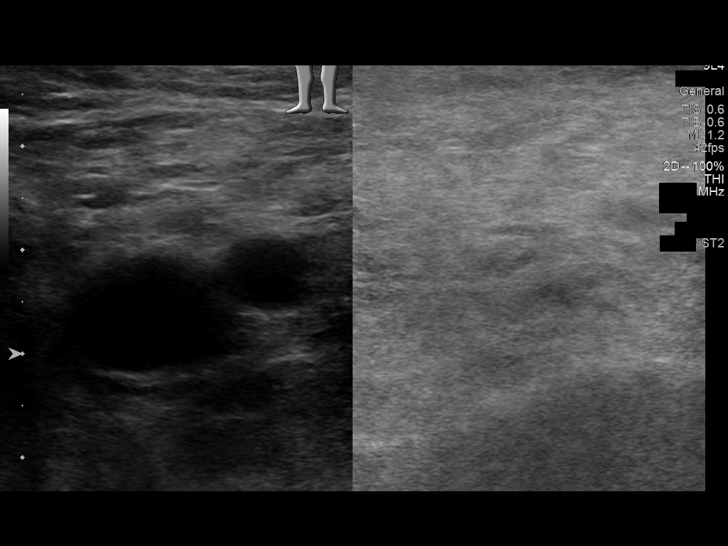
[im 4/38]
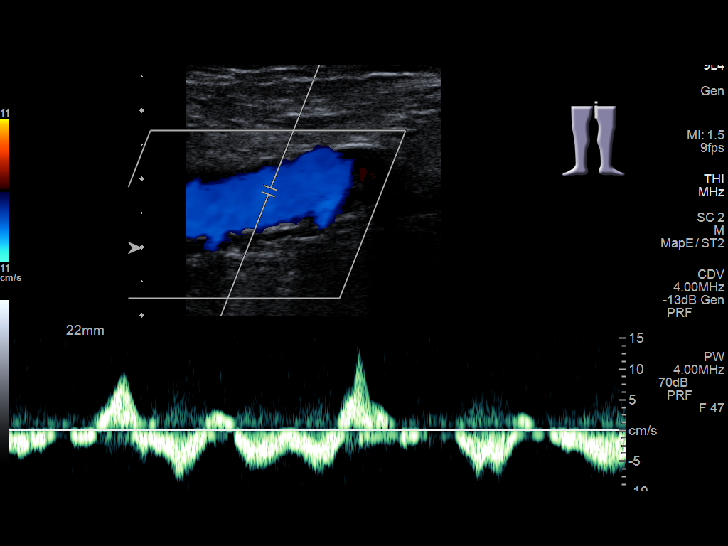
[im 7/38]
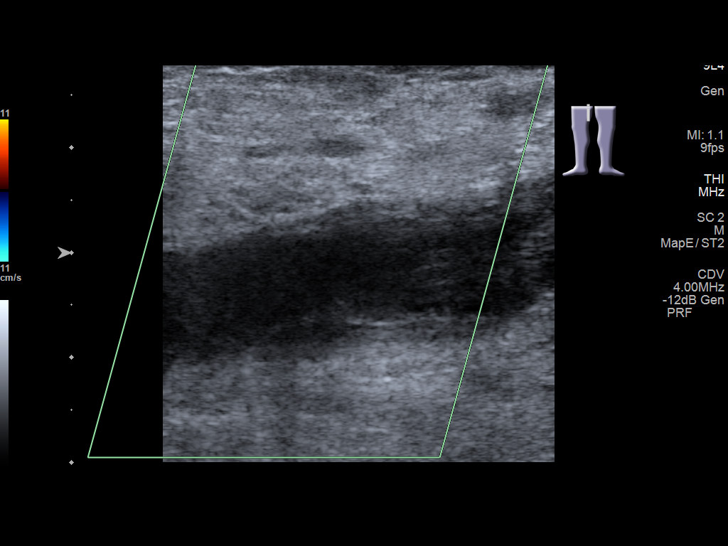
[im 10/38]
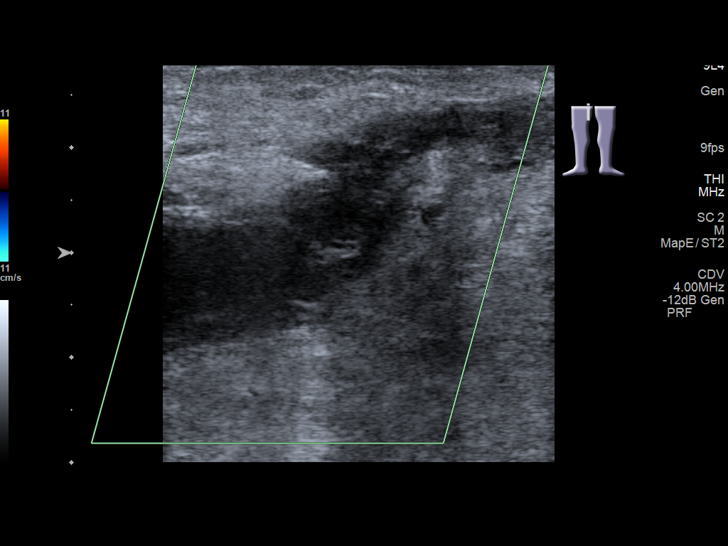
[im 13/38]
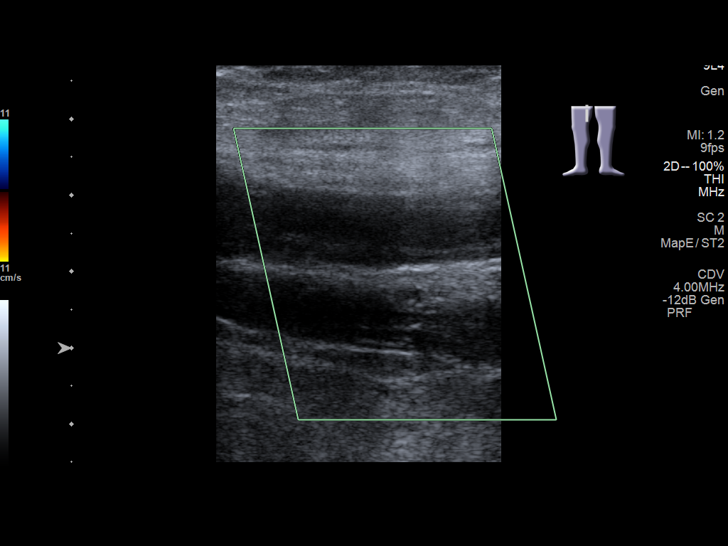
[im 17/38]
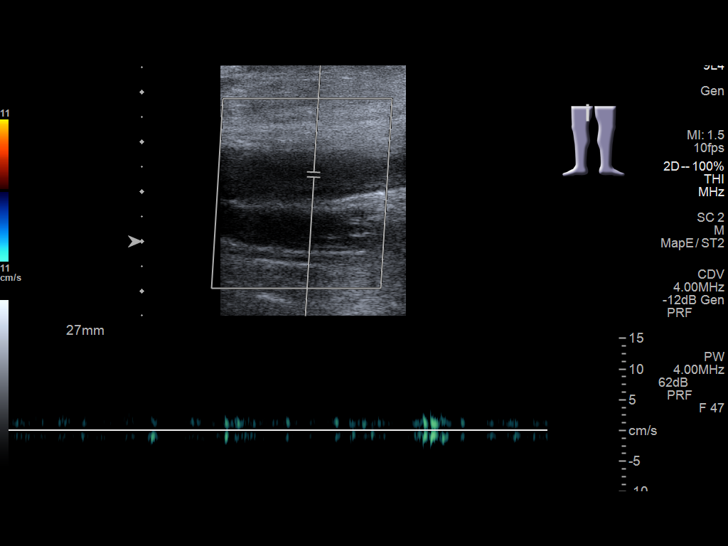
[im 20/38]
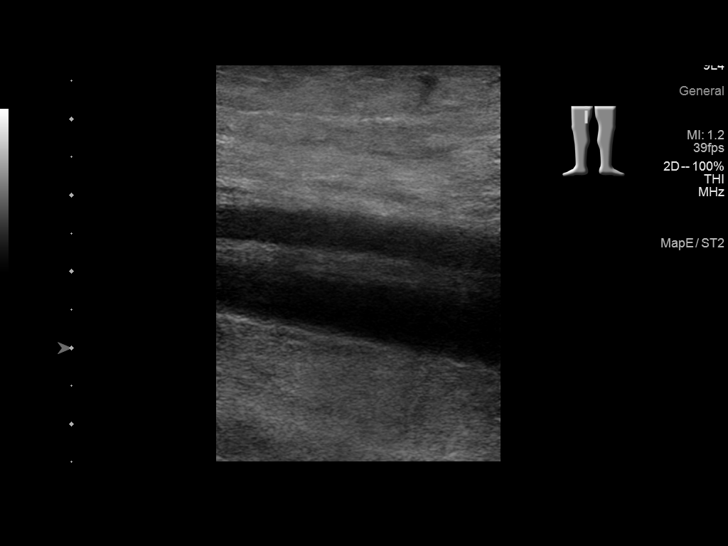
[im 21/38]
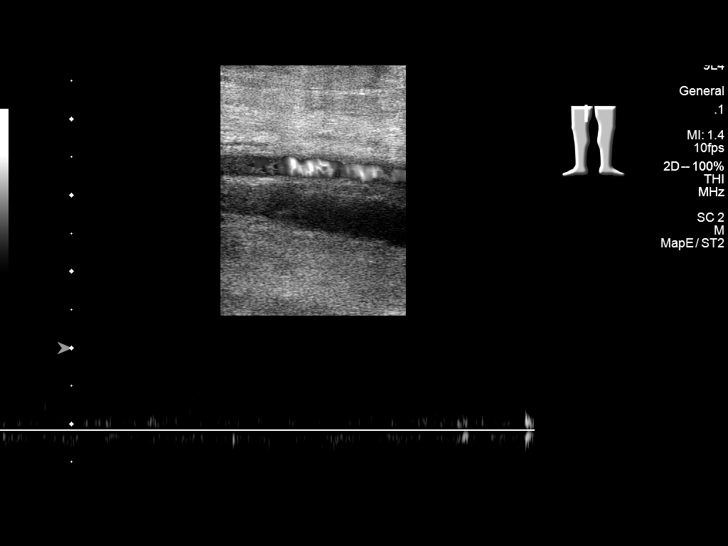
[im 25/38]
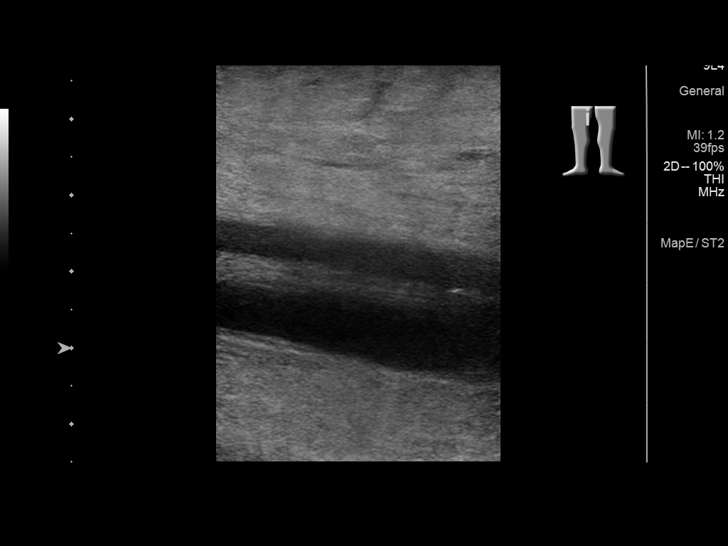
[im 28/38]
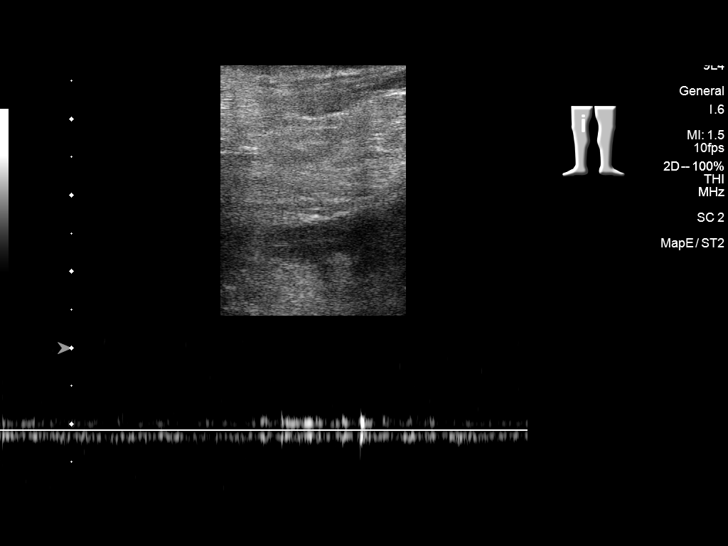
[im 31/38]
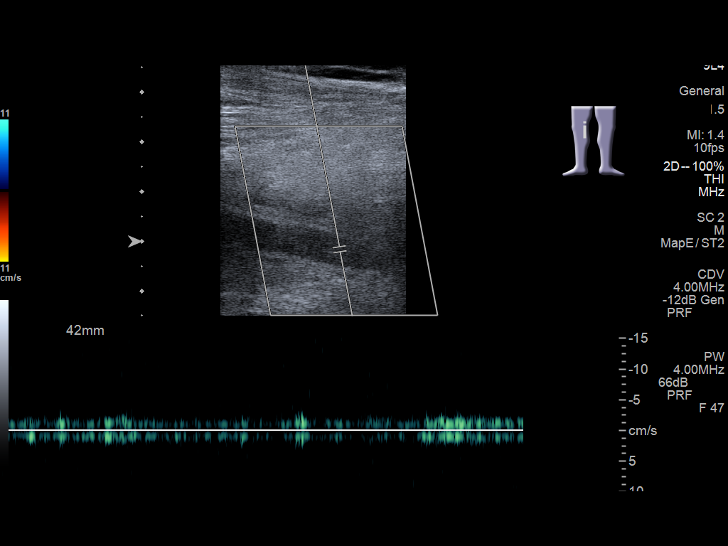
[im 34/38]
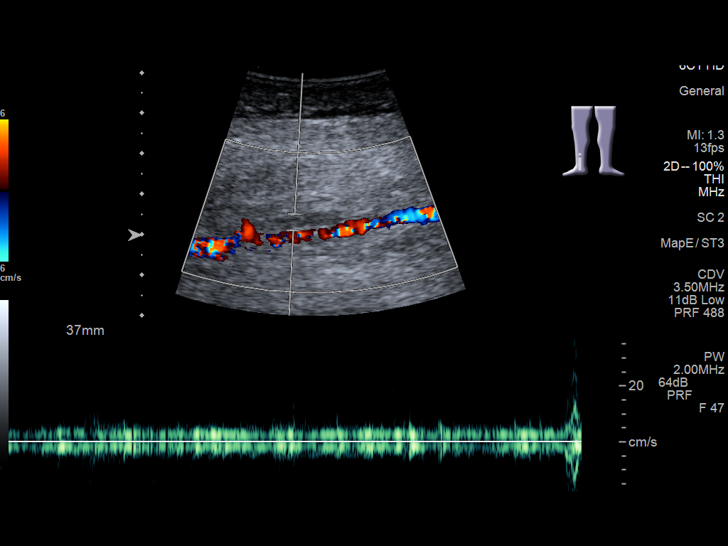
[im 38/38]
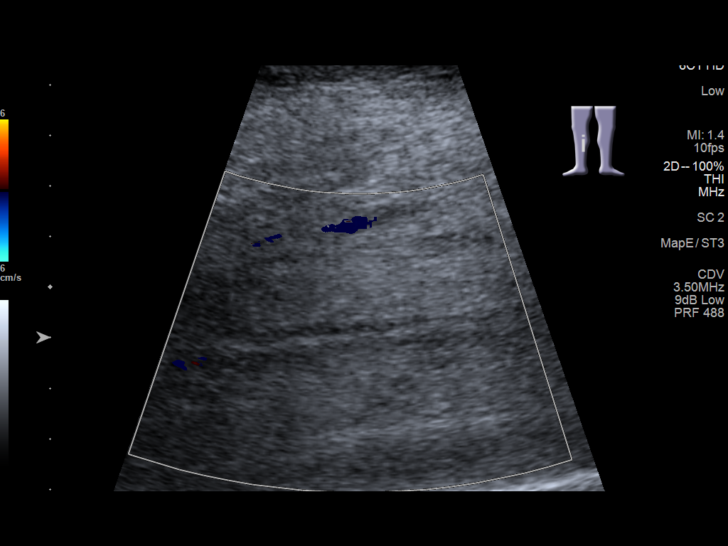

[13 of 24 positions shown; findings below may reference images not displayed]

FINDINGS: Contralateral Common Femoral Vein: Respiratory phasicity is normal
and symmetric with the symptomatic side. No evidence of thrombus.
Normal compressibility.

Common Femoral Vein: Occlusive hypoechoic thrombus. Noncompressible.

Saphenofemoral Junction: Occlusive hypoechoic thrombus.
Noncompressible.

Profunda Femoral Vein: Occlusive hypoechoic thrombus.
Noncompressible.

Femoral Vein: Occlusive hypoechoic thrombus throughout.
Noncompressible.

Popliteal Vein: Similar occlusive hypoechoic thrombus throughout.
Noncompressible

Calf Veins: Thrombus extends into the tibial and peroneal calf
veins.

Superficial Great Saphenous Vein: Not assessed

Venous Reflux:  Not assessed

Other Findings:  Extremity subcutaneous edema noted.
IMPRESSION: Extensive acute occlusive right lower extremity DVT from the common
femoral vein into the calf tibial veins.

These results will be called to the ordering clinician or
representative by the Radiologist Assistant, and communication
documented in the PACS or zVision Dashboard.

## 2020-09-16 DIAGNOSIS — G8254 Quadriplegia, C5-C7 incomplete: Secondary | ICD-10-CM | POA: Diagnosis not present

## 2020-09-16 DIAGNOSIS — M545 Low back pain, unspecified: Secondary | ICD-10-CM | POA: Diagnosis not present

## 2020-09-16 DIAGNOSIS — G904 Autonomic dysreflexia: Secondary | ICD-10-CM | POA: Diagnosis not present

## 2020-09-16 DIAGNOSIS — N319 Neuromuscular dysfunction of bladder, unspecified: Secondary | ICD-10-CM | POA: Diagnosis not present

## 2020-09-16 DIAGNOSIS — I10 Essential (primary) hypertension: Secondary | ICD-10-CM | POA: Diagnosis not present

## 2020-09-16 DIAGNOSIS — E349 Endocrine disorder, unspecified: Secondary | ICD-10-CM | POA: Diagnosis not present

## 2020-09-16 DIAGNOSIS — I82401 Acute embolism and thrombosis of unspecified deep veins of right lower extremity: Secondary | ICD-10-CM | POA: Diagnosis not present

## 2020-09-16 DIAGNOSIS — G950 Syringomyelia and syringobulbia: Secondary | ICD-10-CM | POA: Diagnosis not present

## 2020-09-16 DIAGNOSIS — Z6822 Body mass index (BMI) 22.0-22.9, adult: Secondary | ICD-10-CM | POA: Diagnosis not present

## 2020-09-20 ENCOUNTER — Ambulatory Visit (INDEPENDENT_AMBULATORY_CARE_PROVIDER_SITE_OTHER): Payer: PPO | Admitting: Vascular Surgery

## 2020-10-05 DIAGNOSIS — N319 Neuromuscular dysfunction of bladder, unspecified: Secondary | ICD-10-CM | POA: Diagnosis not present

## 2020-10-05 DIAGNOSIS — R339 Retention of urine, unspecified: Secondary | ICD-10-CM | POA: Diagnosis not present

## 2020-10-11 ENCOUNTER — Ambulatory Visit (INDEPENDENT_AMBULATORY_CARE_PROVIDER_SITE_OTHER): Payer: PPO | Admitting: Vascular Surgery

## 2020-10-17 DIAGNOSIS — N390 Urinary tract infection, site not specified: Secondary | ICD-10-CM | POA: Diagnosis not present

## 2020-10-17 DIAGNOSIS — Z6822 Body mass index (BMI) 22.0-22.9, adult: Secondary | ICD-10-CM | POA: Diagnosis not present

## 2020-10-26 DIAGNOSIS — M545 Low back pain, unspecified: Secondary | ICD-10-CM | POA: Diagnosis not present

## 2020-10-26 DIAGNOSIS — N319 Neuromuscular dysfunction of bladder, unspecified: Secondary | ICD-10-CM | POA: Diagnosis not present

## 2020-10-26 DIAGNOSIS — I82401 Acute embolism and thrombosis of unspecified deep veins of right lower extremity: Secondary | ICD-10-CM | POA: Diagnosis not present

## 2020-10-26 DIAGNOSIS — G8254 Quadriplegia, C5-C7 incomplete: Secondary | ICD-10-CM | POA: Diagnosis not present

## 2020-10-26 DIAGNOSIS — Z125 Encounter for screening for malignant neoplasm of prostate: Secondary | ICD-10-CM | POA: Diagnosis not present

## 2020-10-26 DIAGNOSIS — I10 Essential (primary) hypertension: Secondary | ICD-10-CM | POA: Diagnosis not present

## 2020-10-26 DIAGNOSIS — Z6822 Body mass index (BMI) 22.0-22.9, adult: Secondary | ICD-10-CM | POA: Diagnosis not present

## 2020-10-26 DIAGNOSIS — E349 Endocrine disorder, unspecified: Secondary | ICD-10-CM | POA: Diagnosis not present

## 2020-10-26 DIAGNOSIS — G950 Syringomyelia and syringobulbia: Secondary | ICD-10-CM | POA: Diagnosis not present

## 2020-10-26 DIAGNOSIS — G904 Autonomic dysreflexia: Secondary | ICD-10-CM | POA: Diagnosis not present

## 2020-10-26 DIAGNOSIS — Z79899 Other long term (current) drug therapy: Secondary | ICD-10-CM | POA: Diagnosis not present

## 2020-11-04 DIAGNOSIS — N319 Neuromuscular dysfunction of bladder, unspecified: Secondary | ICD-10-CM | POA: Diagnosis not present

## 2020-11-04 DIAGNOSIS — R339 Retention of urine, unspecified: Secondary | ICD-10-CM | POA: Diagnosis not present

## 2020-11-08 ENCOUNTER — Telehealth (INDEPENDENT_AMBULATORY_CARE_PROVIDER_SITE_OTHER): Payer: Self-pay

## 2020-11-21 ENCOUNTER — Telehealth: Payer: Self-pay

## 2020-11-21 NOTE — Telephone Encounter (Signed)
Spoke with patient and scheduled appt

## 2020-11-21 NOTE — Telephone Encounter (Signed)
Incoming call from pt who states that he has had worsening urinary leakage for the past few months. He states that he self caths and has also had to decrease the size of his catheters over the past few weeks. He requests an appt with Dr. Lonna Cobb, he was informed that the provider was out of the office and offered to see another provider which he declined. He questions if there are any medications he can take to help with the urinary incontinence he is having. Please advise.

## 2020-11-21 NOTE — Telephone Encounter (Signed)
He needs to see Dr. Lonna Cobb for futher evaluation as several things may be leading to his leakage issues.

## 2020-11-30 ENCOUNTER — Ambulatory Visit: Payer: PPO | Admitting: Urology

## 2020-11-30 DIAGNOSIS — G904 Autonomic dysreflexia: Secondary | ICD-10-CM | POA: Diagnosis not present

## 2020-11-30 DIAGNOSIS — I82401 Acute embolism and thrombosis of unspecified deep veins of right lower extremity: Secondary | ICD-10-CM | POA: Diagnosis not present

## 2020-11-30 DIAGNOSIS — N319 Neuromuscular dysfunction of bladder, unspecified: Secondary | ICD-10-CM | POA: Diagnosis not present

## 2020-11-30 DIAGNOSIS — G8254 Quadriplegia, C5-C7 incomplete: Secondary | ICD-10-CM | POA: Diagnosis not present

## 2020-11-30 DIAGNOSIS — Z6822 Body mass index (BMI) 22.0-22.9, adult: Secondary | ICD-10-CM | POA: Diagnosis not present

## 2020-11-30 DIAGNOSIS — G950 Syringomyelia and syringobulbia: Secondary | ICD-10-CM | POA: Diagnosis not present

## 2020-11-30 DIAGNOSIS — N390 Urinary tract infection, site not specified: Secondary | ICD-10-CM | POA: Diagnosis not present

## 2020-11-30 DIAGNOSIS — E349 Endocrine disorder, unspecified: Secondary | ICD-10-CM | POA: Diagnosis not present

## 2020-11-30 DIAGNOSIS — I10 Essential (primary) hypertension: Secondary | ICD-10-CM | POA: Diagnosis not present

## 2020-11-30 DIAGNOSIS — Z79899 Other long term (current) drug therapy: Secondary | ICD-10-CM | POA: Diagnosis not present

## 2020-11-30 DIAGNOSIS — M545 Low back pain, unspecified: Secondary | ICD-10-CM | POA: Diagnosis not present

## 2020-12-07 ENCOUNTER — Ambulatory Visit: Payer: PPO | Admitting: Urology

## 2020-12-08 DIAGNOSIS — N319 Neuromuscular dysfunction of bladder, unspecified: Secondary | ICD-10-CM | POA: Diagnosis not present

## 2020-12-08 DIAGNOSIS — R339 Retention of urine, unspecified: Secondary | ICD-10-CM | POA: Diagnosis not present

## 2020-12-16 DIAGNOSIS — Z6822 Body mass index (BMI) 22.0-22.9, adult: Secondary | ICD-10-CM | POA: Diagnosis not present

## 2020-12-16 DIAGNOSIS — M25511 Pain in right shoulder: Secondary | ICD-10-CM | POA: Diagnosis not present

## 2020-12-16 DIAGNOSIS — G904 Autonomic dysreflexia: Secondary | ICD-10-CM | POA: Diagnosis not present

## 2020-12-16 DIAGNOSIS — N319 Neuromuscular dysfunction of bladder, unspecified: Secondary | ICD-10-CM | POA: Diagnosis not present

## 2020-12-16 DIAGNOSIS — G8254 Quadriplegia, C5-C7 incomplete: Secondary | ICD-10-CM | POA: Diagnosis not present

## 2020-12-16 DIAGNOSIS — M25512 Pain in left shoulder: Secondary | ICD-10-CM | POA: Diagnosis not present

## 2020-12-21 ENCOUNTER — Other Ambulatory Visit: Payer: Self-pay

## 2020-12-21 ENCOUNTER — Encounter: Payer: Self-pay | Admitting: Urology

## 2020-12-21 ENCOUNTER — Ambulatory Visit (INDEPENDENT_AMBULATORY_CARE_PROVIDER_SITE_OTHER): Payer: PPO | Admitting: Urology

## 2020-12-21 VITALS — BP 97/61 | HR 87 | Ht 67.0 in | Wt 150.0 lb

## 2020-12-21 DIAGNOSIS — N390 Urinary tract infection, site not specified: Secondary | ICD-10-CM

## 2020-12-21 DIAGNOSIS — N319 Neuromuscular dysfunction of bladder, unspecified: Secondary | ICD-10-CM | POA: Diagnosis not present

## 2020-12-21 MED ORDER — OXYBUTYNIN CHLORIDE ER 15 MG PO TB24: 15.0000 mg | ORAL_TABLET | Freq: Every day | ORAL | 3 refills | Status: DC
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
## 2020-12-22 LAB — MICROSCOPIC EXAMINATION

## 2020-12-22 LAB — URINALYSIS, COMPLETE
Bilirubin, UA: NEGATIVE
Glucose, UA: NEGATIVE
Ketones, UA: NEGATIVE
Nitrite, UA: NEGATIVE
RBC, UA: NEGATIVE
Specific Gravity, UA: 1.015 (ref 1.005–1.030)
Urobilinogen, Ur: 0.2 mg/dL (ref 0.2–1.0)
pH, UA: 7.5 (ref 5.0–7.5)

## 2020-12-23 ENCOUNTER — Other Ambulatory Visit: Payer: Self-pay | Admitting: *Deleted

## 2020-12-23 ENCOUNTER — Telehealth: Payer: Self-pay | Admitting: *Deleted

## 2020-12-23 MED ORDER — SULFAMETHOXAZOLE-TRIMETHOPRIM 800-160 MG PO TABS: 1.0000 | ORAL_TABLET | Freq: Two times a day (BID) | ORAL | 0 refills | Status: DC

## 2020-12-23 MED ORDER — SULFAMETHOXAZOLE-TRIMETHOPRIM 800-160 MG PO TABS: 1.0000 | ORAL_TABLET | Freq: Two times a day (BID) | ORAL | 0 refills | Status: AC

## 2020-12-23 NOTE — Telephone Encounter (Signed)
Patient called to request resend Bactrim to [facility]. RX resent.

## 2020-12-23 NOTE — Telephone Encounter (Signed)
-----   Message from Riki Altes, MD sent at 12/22/2020  5:33 PM EDT ----- Urinalysis did show WBCs.  Can send in Rx Septra DS 1 tab twice daily x7 days

## 2020-12-25 LAB — CULTURE, URINE COMPREHENSIVE

## 2020-12-26 ENCOUNTER — Telehealth: Payer: Self-pay | Admitting: *Deleted

## 2020-12-26 NOTE — Telephone Encounter (Signed)
-----   Message from Riki Altes, MD sent at 12/25/2020  1:28 PM EDT ----- Urine culture was negative for infection

## 2020-12-26 NOTE — Telephone Encounter (Signed)
Left message in voice mail per DPR.

## 2021-01-03 ENCOUNTER — Telehealth: Payer: Self-pay | Admitting: Urology

## 2021-01-03 DIAGNOSIS — N319 Neuromuscular dysfunction of bladder, unspecified: Secondary | ICD-10-CM | POA: Diagnosis not present

## 2021-01-03 DIAGNOSIS — R339 Retention of urine, unspecified: Secondary | ICD-10-CM | POA: Diagnosis not present

## 2021-01-03 NOTE — Telephone Encounter (Signed)
Explained cysto to patient . Patient states he is not feeling good. He will call back to reschedule. Than he told me he was having brown urine in his bag I told him he  need to come to the office give a sample with pa. He states he will not drive from [place].

## 2021-01-03 NOTE — Telephone Encounter (Signed)
Pt. Called and ask if someone could call him and discuss his upcoming in office Cysto and he would also like to discuss some symptoms he is now having and not sure if he should be treated prior to cysto. Please advise.

## 2021-01-06 ENCOUNTER — Other Ambulatory Visit: Payer: PPO | Admitting: Urology

## 2021-01-09 ENCOUNTER — Telehealth: Payer: Self-pay

## 2021-01-09 ENCOUNTER — Other Ambulatory Visit: Payer: Self-pay

## 2021-01-09 ENCOUNTER — Other Ambulatory Visit: Payer: PPO

## 2021-01-09 DIAGNOSIS — N390 Urinary tract infection, site not specified: Secondary | ICD-10-CM

## 2021-01-09 LAB — URINALYSIS, COMPLETE
Bilirubin, UA: NEGATIVE
Glucose, UA: NEGATIVE
Nitrite, UA: NEGATIVE
Specific Gravity, UA: 1.01 (ref 1.005–1.030)
Urobilinogen, Ur: 0.2 mg/dL (ref 0.2–1.0)
pH, UA: 6.5 (ref 5.0–7.5)

## 2021-01-09 LAB — MICROSCOPIC EXAMINATION: RBC, Urine: NONE SEEN /hpf (ref 0–2)

## 2021-01-09 NOTE — Telephone Encounter (Signed)
Notified patient as instructed,.  

## 2021-01-09 NOTE — Telephone Encounter (Signed)
Pt calls triage line and states he was told to call Dr. Lonna Cobb for any changes, he states that yesterday he saw blood at the tip of catheter x 2. He states he has never seen blood in or on his catheter in 18yrs of CIC, he is very concerned about this. He denies fever, chills, or pain. He states his spasms and urinary leakage have been better with antibiotic. Advised pt blood likely from inflammation secondary to infection, however patient still wanted providers input. Please advise.

## 2021-01-09 NOTE — Telephone Encounter (Signed)
Recommend urine culture and rescheduling cystoscopy that he canceled

## 2021-01-11 LAB — CULTURE, URINE COMPREHENSIVE

## 2021-01-12 ENCOUNTER — Telehealth: Payer: Self-pay | Admitting: *Deleted

## 2021-01-12 DIAGNOSIS — E349 Endocrine disorder, unspecified: Secondary | ICD-10-CM | POA: Diagnosis not present

## 2021-01-12 DIAGNOSIS — M545 Low back pain, unspecified: Secondary | ICD-10-CM | POA: Diagnosis not present

## 2021-01-12 DIAGNOSIS — I82401 Acute embolism and thrombosis of unspecified deep veins of right lower extremity: Secondary | ICD-10-CM | POA: Diagnosis not present

## 2021-01-12 DIAGNOSIS — G904 Autonomic dysreflexia: Secondary | ICD-10-CM | POA: Diagnosis not present

## 2021-01-12 DIAGNOSIS — N319 Neuromuscular dysfunction of bladder, unspecified: Secondary | ICD-10-CM | POA: Diagnosis not present

## 2021-01-12 DIAGNOSIS — G950 Syringomyelia and syringobulbia: Secondary | ICD-10-CM | POA: Diagnosis not present

## 2021-01-12 DIAGNOSIS — I10 Essential (primary) hypertension: Secondary | ICD-10-CM | POA: Diagnosis not present

## 2021-01-12 DIAGNOSIS — Z6822 Body mass index (BMI) 22.0-22.9, adult: Secondary | ICD-10-CM | POA: Diagnosis not present

## 2021-01-12 DIAGNOSIS — G8254 Quadriplegia, C5-C7 incomplete: Secondary | ICD-10-CM | POA: Diagnosis not present

## 2021-01-12 NOTE — Telephone Encounter (Signed)
Notified patient as instructed, patient pleased °

## 2021-01-12 NOTE — Telephone Encounter (Signed)
-----   Message from Riki Altes, MD sent at 01/12/2021  7:18 AM EDT ----- Urine culture showed no evidence of infection.  Keep follow-up appointment as scheduled

## 2021-01-25 DIAGNOSIS — N319 Neuromuscular dysfunction of bladder, unspecified: Secondary | ICD-10-CM | POA: Diagnosis not present

## 2021-01-25 DIAGNOSIS — R339 Retention of urine, unspecified: Secondary | ICD-10-CM | POA: Diagnosis not present

## 2021-01-30 ENCOUNTER — Encounter: Payer: Self-pay | Admitting: Urology

## 2021-01-30 ENCOUNTER — Ambulatory Visit: Payer: PPO | Admitting: Urology

## 2021-01-30 ENCOUNTER — Other Ambulatory Visit: Payer: Self-pay

## 2021-01-30 VITALS — BP 130/70 | HR 74 | Ht 68.0 in | Wt 150.0 lb

## 2021-01-30 DIAGNOSIS — N319 Neuromuscular dysfunction of bladder, unspecified: Secondary | ICD-10-CM | POA: Diagnosis not present

## 2021-01-30 DIAGNOSIS — N390 Urinary tract infection, site not specified: Secondary | ICD-10-CM | POA: Diagnosis not present

## 2021-01-30 MED ORDER — CEFUROXIME AXETIL 250 MG PO TABS
500.0000 mg | ORAL_TABLET | Freq: Once | ORAL | Status: AC
  Administered 2021-01-30: 500 mg via ORAL

## 2021-01-30 MED ORDER — CEFUROXIME AXETIL 250 MG PO TABS: 500.0000 mg | ORAL_TABLET | Freq: Two times a day (BID) | ORAL | Status: DC

## 2021-01-30 NOTE — Progress Notes (Signed)
Difficult  01/30/21  CC:  Chief Complaint  Patient presents with   Cysto    HPI: Neurogenic bladder on CIC and having difficulty catheterizing  Blood pressure 130/70, pulse 74, height 5\' 8"  (1.727 m), weight 150 lb (68 kg). NED. A&Ox3.   No respiratory distress   Abd soft, NT, ND Normal phallus with bilateral descended testicles  Cystoscopy Procedure Note  Patient identification was confirmed, informed consent was obtained, and patient was prepped using Betadine solution.  Lidocaine jelly was administered per urethral meatus.     Pre-Procedure: -Septra DS given preprocedure - Inspection reveals a normal caliber urethral meatus.  Procedure: The flexible cystoscope was introduced without difficulty - No urethral strictures/lesions are present. -Mild lateral lobe enlargement prostate  -Mild elevation bladder neck - Bilateral ureteral orifices identified - Bladder mucosa  reveals no ulcers, tumors, or lesions - No bladder stones - No trabeculation  Retroflexion shows no abnormalities   Post-Procedure: - Patient tolerated the procedure well  Assessment/ Plan: No evidence urethral stricture Given samples 12 French coud catheter to try   , MD

## 2021-02-01 LAB — URINALYSIS, COMPLETE
Bilirubin, UA: NEGATIVE
Glucose, UA: NEGATIVE
Ketones, UA: NEGATIVE
Nitrite, UA: NEGATIVE
Protein,UA: NEGATIVE
Specific Gravity, UA: 1.025 (ref 1.005–1.030)
Urobilinogen, Ur: 0.2 mg/dL (ref 0.2–1.0)
pH, UA: 5.5 (ref 5.0–7.5)

## 2021-02-01 LAB — MICROSCOPIC EXAMINATION: WBC, UA: >30 /hpf — ABNORMAL HIGH (ref 0–5)

## 2021-02-06 ENCOUNTER — Telehealth: Payer: Self-pay

## 2021-02-06 DIAGNOSIS — N319 Neuromuscular dysfunction of bladder, unspecified: Secondary | ICD-10-CM | POA: Diagnosis not present

## 2021-02-06 DIAGNOSIS — M545 Low back pain, unspecified: Secondary | ICD-10-CM | POA: Diagnosis not present

## 2021-02-06 DIAGNOSIS — G8254 Quadriplegia, C5-C7 incomplete: Secondary | ICD-10-CM | POA: Diagnosis not present

## 2021-02-06 DIAGNOSIS — Z6822 Body mass index (BMI) 22.0-22.9, adult: Secondary | ICD-10-CM | POA: Diagnosis not present

## 2021-02-06 DIAGNOSIS — Z23 Encounter for immunization: Secondary | ICD-10-CM | POA: Diagnosis not present

## 2021-02-06 NOTE — Telephone Encounter (Signed)
Pt called after hours ( see media) complaining of infection after cystoscopy. I called pt and left a message to call back to schedule appointment for [place]. Pt was advised to go to Urgent Care but pt did not go.

## 2021-02-13 ENCOUNTER — Telehealth (INDEPENDENT_AMBULATORY_CARE_PROVIDER_SITE_OTHER): Payer: Self-pay

## 2021-02-13 ENCOUNTER — Other Ambulatory Visit (INDEPENDENT_AMBULATORY_CARE_PROVIDER_SITE_OTHER): Payer: Self-pay | Admitting: Nurse Practitioner

## 2021-02-13 ENCOUNTER — Telehealth: Payer: Self-pay

## 2021-02-13 MED ORDER — TAMSULOSIN HCL 0.4 MG PO CAPS: 0.4000 mg | ORAL_CAPSULE | Freq: Every day | ORAL | 0 refills | Status: DC

## 2021-02-13 NOTE — Telephone Encounter (Signed)
Flomax should not make intermittent catheterization easier.  If he voids between caths it could potentially help him empty better.  Can send in Rx 0.4 mg #30 if he wants to try

## 2021-02-13 NOTE — Telephone Encounter (Signed)
Patient called stating that the samples of 71fr coude catheters did not work well for him. He states that they are too stiff and painful to pass. He is using his previous catheters.  Patient would like to know if he would benefit from starting Flomax? He states he has cleared this with Dr. Wyn Quaker and it is fine from a vascular stand point. Please advise

## 2021-02-13 NOTE — Telephone Encounter (Signed)
Patient was made aware with medical advice and verbalized understanding 

## 2021-02-13 NOTE — Telephone Encounter (Signed)
It is fine from our perspective

## 2021-02-13 NOTE — Telephone Encounter (Signed)
Notified patient as instructed,  Patient wants to try flomax . Send in #30

## 2021-02-14 ENCOUNTER — Ambulatory Visit (INDEPENDENT_AMBULATORY_CARE_PROVIDER_SITE_OTHER): Payer: PPO | Admitting: Vascular Surgery

## 2021-02-14 DIAGNOSIS — G8254 Quadriplegia, C5-C7 incomplete: Secondary | ICD-10-CM | POA: Diagnosis not present

## 2021-02-14 DIAGNOSIS — E349 Endocrine disorder, unspecified: Secondary | ICD-10-CM | POA: Diagnosis not present

## 2021-02-14 DIAGNOSIS — I10 Essential (primary) hypertension: Secondary | ICD-10-CM | POA: Diagnosis not present

## 2021-02-14 DIAGNOSIS — Z6822 Body mass index (BMI) 22.0-22.9, adult: Secondary | ICD-10-CM | POA: Diagnosis not present

## 2021-02-14 DIAGNOSIS — G950 Syringomyelia and syringobulbia: Secondary | ICD-10-CM | POA: Diagnosis not present

## 2021-02-14 DIAGNOSIS — Z86718 Personal history of other venous thrombosis and embolism: Secondary | ICD-10-CM | POA: Diagnosis not present

## 2021-02-14 DIAGNOSIS — R339 Retention of urine, unspecified: Secondary | ICD-10-CM | POA: Diagnosis not present

## 2021-02-14 DIAGNOSIS — G904 Autonomic dysreflexia: Secondary | ICD-10-CM | POA: Diagnosis not present

## 2021-02-14 DIAGNOSIS — N319 Neuromuscular dysfunction of bladder, unspecified: Secondary | ICD-10-CM | POA: Diagnosis not present

## 2021-02-14 DIAGNOSIS — M545 Low back pain, unspecified: Secondary | ICD-10-CM | POA: Diagnosis not present

## 2021-02-21 ENCOUNTER — Other Ambulatory Visit: Payer: Self-pay

## 2021-02-21 ENCOUNTER — Encounter (INDEPENDENT_AMBULATORY_CARE_PROVIDER_SITE_OTHER): Payer: Self-pay | Admitting: Vascular Surgery

## 2021-02-21 ENCOUNTER — Ambulatory Visit (INDEPENDENT_AMBULATORY_CARE_PROVIDER_SITE_OTHER): Payer: PPO | Admitting: Vascular Surgery

## 2021-02-21 VITALS — BP 105/66 | HR 84 | Resp 16

## 2021-02-21 DIAGNOSIS — I82411 Acute embolism and thrombosis of right femoral vein: Secondary | ICD-10-CM | POA: Diagnosis not present

## 2021-02-21 DIAGNOSIS — S14109D Unspecified injury at unspecified level of cervical spinal cord, subsequent encounter: Secondary | ICD-10-CM | POA: Diagnosis not present

## 2021-02-21 MED ORDER — APIXABAN 2.5 MG PO TABS: 2.5000 mg | ORAL_TABLET | Freq: Two times a day (BID) | ORAL | 11 refills | Status: DC

## 2021-02-21 MED ORDER — APIXABAN 2.5 MG PO TABS: 2.5000 mg | ORAL_TABLET | Freq: Two times a day (BID) | ORAL | Status: DC
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
## 2021-02-21 NOTE — Assessment & Plan Note (Signed)
2 years status post thrombectomy for acute lower extremity DVT.  Legs are doing great.  I am okay with reducing his dose of Eliquis to 2.5 mg twice daily and I can send in a new prescription today.  Return to clinic in 1 year.

## 2021-03-02 DIAGNOSIS — N390 Urinary tract infection, site not specified: Secondary | ICD-10-CM | POA: Diagnosis not present

## 2021-03-08 DIAGNOSIS — R339 Retention of urine, unspecified: Secondary | ICD-10-CM | POA: Diagnosis not present

## 2021-03-08 DIAGNOSIS — N319 Neuromuscular dysfunction of bladder, unspecified: Secondary | ICD-10-CM | POA: Diagnosis not present

## 2021-03-17 DIAGNOSIS — M25512 Pain in left shoulder: Secondary | ICD-10-CM | POA: Diagnosis not present

## 2021-03-17 DIAGNOSIS — G8254 Quadriplegia, C5-C7 incomplete: Secondary | ICD-10-CM | POA: Diagnosis not present

## 2021-03-17 DIAGNOSIS — G904 Autonomic dysreflexia: Secondary | ICD-10-CM | POA: Diagnosis not present

## 2021-03-17 DIAGNOSIS — N319 Neuromuscular dysfunction of bladder, unspecified: Secondary | ICD-10-CM | POA: Diagnosis not present

## 2021-03-17 DIAGNOSIS — M545 Low back pain, unspecified: Secondary | ICD-10-CM | POA: Diagnosis not present

## 2021-03-17 DIAGNOSIS — Z6822 Body mass index (BMI) 22.0-22.9, adult: Secondary | ICD-10-CM | POA: Diagnosis not present

## 2021-03-17 DIAGNOSIS — G894 Chronic pain syndrome: Secondary | ICD-10-CM | POA: Diagnosis not present

## 2021-03-17 DIAGNOSIS — M25511 Pain in right shoulder: Secondary | ICD-10-CM | POA: Diagnosis not present

## 2021-03-30 DIAGNOSIS — E349 Endocrine disorder, unspecified: Secondary | ICD-10-CM | POA: Diagnosis not present

## 2021-04-07 DIAGNOSIS — N319 Neuromuscular dysfunction of bladder, unspecified: Secondary | ICD-10-CM | POA: Diagnosis not present

## 2021-04-07 DIAGNOSIS — R339 Retention of urine, unspecified: Secondary | ICD-10-CM | POA: Diagnosis not present

## 2021-05-11 DIAGNOSIS — N390 Urinary tract infection, site not specified: Secondary | ICD-10-CM | POA: Diagnosis not present

## 2021-05-14 ENCOUNTER — Other Ambulatory Visit: Payer: Self-pay | Admitting: Urology

## 2021-05-18 DIAGNOSIS — N319 Neuromuscular dysfunction of bladder, unspecified: Secondary | ICD-10-CM | POA: Diagnosis not present

## 2021-05-18 DIAGNOSIS — R339 Retention of urine, unspecified: Secondary | ICD-10-CM | POA: Diagnosis not present

## 2021-05-26 DIAGNOSIS — M545 Low back pain, unspecified: Secondary | ICD-10-CM | POA: Diagnosis not present

## 2021-05-26 DIAGNOSIS — G894 Chronic pain syndrome: Secondary | ICD-10-CM | POA: Diagnosis not present

## 2021-05-26 DIAGNOSIS — Z79899 Other long term (current) drug therapy: Secondary | ICD-10-CM | POA: Diagnosis not present

## 2021-05-26 DIAGNOSIS — Z6822 Body mass index (BMI) 22.0-22.9, adult: Secondary | ICD-10-CM | POA: Diagnosis not present

## 2021-05-26 DIAGNOSIS — E349 Endocrine disorder, unspecified: Secondary | ICD-10-CM | POA: Diagnosis not present

## 2021-05-26 DIAGNOSIS — N319 Neuromuscular dysfunction of bladder, unspecified: Secondary | ICD-10-CM | POA: Diagnosis not present

## 2021-05-26 DIAGNOSIS — N390 Urinary tract infection, site not specified: Secondary | ICD-10-CM | POA: Diagnosis not present

## 2021-05-26 DIAGNOSIS — G8254 Quadriplegia, C5-C7 incomplete: Secondary | ICD-10-CM | POA: Diagnosis not present

## 2021-05-26 DIAGNOSIS — G904 Autonomic dysreflexia: Secondary | ICD-10-CM | POA: Diagnosis not present

## 2021-06-06 DIAGNOSIS — M19032 Primary osteoarthritis, left wrist: Secondary | ICD-10-CM | POA: Diagnosis not present

## 2021-06-15 DIAGNOSIS — R339 Retention of urine, unspecified: Secondary | ICD-10-CM | POA: Diagnosis not present

## 2021-06-15 DIAGNOSIS — N319 Neuromuscular dysfunction of bladder, unspecified: Secondary | ICD-10-CM | POA: Diagnosis not present

## 2021-06-22 DIAGNOSIS — M25512 Pain in left shoulder: Secondary | ICD-10-CM | POA: Diagnosis not present

## 2021-06-22 DIAGNOSIS — M25511 Pain in right shoulder: Secondary | ICD-10-CM | POA: Diagnosis not present

## 2021-06-22 DIAGNOSIS — G904 Autonomic dysreflexia: Secondary | ICD-10-CM | POA: Diagnosis not present

## 2021-06-22 DIAGNOSIS — M545 Low back pain, unspecified: Secondary | ICD-10-CM | POA: Diagnosis not present

## 2021-06-22 DIAGNOSIS — G8254 Quadriplegia, C5-C7 incomplete: Secondary | ICD-10-CM | POA: Diagnosis not present

## 2021-06-22 DIAGNOSIS — G894 Chronic pain syndrome: Secondary | ICD-10-CM | POA: Diagnosis not present

## 2021-06-22 DIAGNOSIS — N319 Neuromuscular dysfunction of bladder, unspecified: Secondary | ICD-10-CM | POA: Diagnosis not present

## 2021-06-22 DIAGNOSIS — E349 Endocrine disorder, unspecified: Secondary | ICD-10-CM | POA: Diagnosis not present

## 2021-06-22 DIAGNOSIS — Z6822 Body mass index (BMI) 22.0-22.9, adult: Secondary | ICD-10-CM | POA: Diagnosis not present

## 2021-07-02 DIAGNOSIS — G894 Chronic pain syndrome: Secondary | ICD-10-CM | POA: Diagnosis not present

## 2021-07-02 DIAGNOSIS — Z79899 Other long term (current) drug therapy: Secondary | ICD-10-CM | POA: Diagnosis not present

## 2021-07-02 DIAGNOSIS — G950 Syringomyelia and syringobulbia: Secondary | ICD-10-CM | POA: Diagnosis not present

## 2021-07-02 DIAGNOSIS — F1120 Opioid dependence, uncomplicated: Secondary | ICD-10-CM | POA: Diagnosis not present

## 2021-07-02 DIAGNOSIS — G8254 Quadriplegia, C5-C7 incomplete: Secondary | ICD-10-CM | POA: Diagnosis not present

## 2021-07-02 DIAGNOSIS — I10 Essential (primary) hypertension: Secondary | ICD-10-CM | POA: Diagnosis not present

## 2021-07-02 DIAGNOSIS — G904 Autonomic dysreflexia: Secondary | ICD-10-CM | POA: Diagnosis not present

## 2021-07-04 DIAGNOSIS — N390 Urinary tract infection, site not specified: Secondary | ICD-10-CM | POA: Diagnosis not present

## 2021-07-17 ENCOUNTER — Telehealth (INDEPENDENT_AMBULATORY_CARE_PROVIDER_SITE_OTHER): Payer: Self-pay | Admitting: Vascular Surgery

## 2021-07-17 NOTE — Telephone Encounter (Signed)
Cancel note

## 2021-07-17 NOTE — Telephone Encounter (Signed)
Patient called and wanted to ask if it was ok for his PCP to increase his dosage for testosterone.  He stated with his other medications, he would like for Dr. Wyn Quaker to let him know before getting an increase dosage considering him taking blood thinners. ?

## 2021-07-18 DIAGNOSIS — N319 Neuromuscular dysfunction of bladder, unspecified: Secondary | ICD-10-CM | POA: Diagnosis not present

## 2021-07-18 DIAGNOSIS — R339 Retention of urine, unspecified: Secondary | ICD-10-CM | POA: Diagnosis not present

## 2021-07-21 ENCOUNTER — Telehealth (INDEPENDENT_AMBULATORY_CARE_PROVIDER_SITE_OTHER): Payer: Self-pay | Admitting: Vascular Surgery

## 2021-07-21 NOTE — Telephone Encounter (Signed)
Patient called and wanted to ask if it was ok for his PCP to increase his dosage for testosterone.  He stated with his other medications, he would like for Dr. Wyn Quaker to let him know before getting an increase dosage considering him taking blood thinners. Patient states he is also unable to preform sexually and is causing problems within his marriage need some advise. ?

## 2021-07-21 NOTE — Telephone Encounter (Signed)
Patient was made aware with medical recommendations and verbalized understanding 

## 2021-07-26 DIAGNOSIS — N319 Neuromuscular dysfunction of bladder, unspecified: Secondary | ICD-10-CM | POA: Diagnosis not present

## 2021-07-26 DIAGNOSIS — R5383 Other fatigue: Secondary | ICD-10-CM | POA: Diagnosis not present

## 2021-07-26 DIAGNOSIS — G904 Autonomic dysreflexia: Secondary | ICD-10-CM | POA: Diagnosis not present

## 2021-07-26 DIAGNOSIS — Z79899 Other long term (current) drug therapy: Secondary | ICD-10-CM | POA: Diagnosis not present

## 2021-07-26 DIAGNOSIS — E349 Endocrine disorder, unspecified: Secondary | ICD-10-CM | POA: Diagnosis not present

## 2021-07-26 DIAGNOSIS — Z86718 Personal history of other venous thrombosis and embolism: Secondary | ICD-10-CM | POA: Diagnosis not present

## 2021-07-26 DIAGNOSIS — G8254 Quadriplegia, C5-C7 incomplete: Secondary | ICD-10-CM | POA: Diagnosis not present

## 2021-07-26 DIAGNOSIS — M545 Low back pain, unspecified: Secondary | ICD-10-CM | POA: Diagnosis not present

## 2021-07-26 DIAGNOSIS — G894 Chronic pain syndrome: Secondary | ICD-10-CM | POA: Diagnosis not present

## 2021-08-02 ENCOUNTER — Ambulatory Visit: Payer: Self-pay | Admitting: Urology

## 2021-08-07 DIAGNOSIS — N390 Urinary tract infection, site not specified: Secondary | ICD-10-CM | POA: Diagnosis not present

## 2021-08-09 DIAGNOSIS — N319 Neuromuscular dysfunction of bladder, unspecified: Secondary | ICD-10-CM | POA: Diagnosis not present

## 2021-08-09 DIAGNOSIS — R339 Retention of urine, unspecified: Secondary | ICD-10-CM | POA: Diagnosis not present

## 2021-08-10 DIAGNOSIS — Z9181 History of falling: Secondary | ICD-10-CM | POA: Diagnosis not present

## 2021-08-10 DIAGNOSIS — Z1331 Encounter for screening for depression: Secondary | ICD-10-CM | POA: Diagnosis not present

## 2021-08-10 DIAGNOSIS — Z0000 Encounter for general adult medical examination without abnormal findings: Secondary | ICD-10-CM | POA: Diagnosis not present

## 2021-09-01 DIAGNOSIS — Z86718 Personal history of other venous thrombosis and embolism: Secondary | ICD-10-CM | POA: Diagnosis not present

## 2021-09-01 DIAGNOSIS — G894 Chronic pain syndrome: Secondary | ICD-10-CM | POA: Diagnosis not present

## 2021-09-01 DIAGNOSIS — G904 Autonomic dysreflexia: Secondary | ICD-10-CM | POA: Diagnosis not present

## 2021-09-01 DIAGNOSIS — M545 Low back pain, unspecified: Secondary | ICD-10-CM | POA: Diagnosis not present

## 2021-09-01 DIAGNOSIS — E349 Endocrine disorder, unspecified: Secondary | ICD-10-CM | POA: Diagnosis not present

## 2021-09-01 DIAGNOSIS — G8254 Quadriplegia, C5-C7 incomplete: Secondary | ICD-10-CM | POA: Diagnosis not present

## 2021-09-01 DIAGNOSIS — N319 Neuromuscular dysfunction of bladder, unspecified: Secondary | ICD-10-CM | POA: Diagnosis not present

## 2021-09-11 DIAGNOSIS — N390 Urinary tract infection, site not specified: Secondary | ICD-10-CM | POA: Diagnosis not present

## 2021-09-12 DIAGNOSIS — N319 Neuromuscular dysfunction of bladder, unspecified: Secondary | ICD-10-CM | POA: Diagnosis not present

## 2021-09-12 DIAGNOSIS — R339 Retention of urine, unspecified: Secondary | ICD-10-CM | POA: Diagnosis not present

## 2021-09-19 DIAGNOSIS — N390 Urinary tract infection, site not specified: Secondary | ICD-10-CM | POA: Diagnosis not present

## 2021-10-09 DIAGNOSIS — R61 Generalized hyperhidrosis: Secondary | ICD-10-CM | POA: Diagnosis not present

## 2021-10-09 DIAGNOSIS — G904 Autonomic dysreflexia: Secondary | ICD-10-CM | POA: Diagnosis not present

## 2021-10-09 DIAGNOSIS — M545 Low back pain, unspecified: Secondary | ICD-10-CM | POA: Diagnosis not present

## 2021-10-09 DIAGNOSIS — N319 Neuromuscular dysfunction of bladder, unspecified: Secondary | ICD-10-CM | POA: Diagnosis not present

## 2021-10-09 DIAGNOSIS — G8254 Quadriplegia, C5-C7 incomplete: Secondary | ICD-10-CM | POA: Diagnosis not present

## 2021-10-09 DIAGNOSIS — G894 Chronic pain syndrome: Secondary | ICD-10-CM | POA: Diagnosis not present

## 2021-10-09 DIAGNOSIS — E349 Endocrine disorder, unspecified: Secondary | ICD-10-CM | POA: Diagnosis not present

## 2021-10-09 DIAGNOSIS — Z86718 Personal history of other venous thrombosis and embolism: Secondary | ICD-10-CM | POA: Diagnosis not present

## 2021-10-11 DIAGNOSIS — N319 Neuromuscular dysfunction of bladder, unspecified: Secondary | ICD-10-CM | POA: Diagnosis not present

## 2021-10-11 DIAGNOSIS — R339 Retention of urine, unspecified: Secondary | ICD-10-CM | POA: Diagnosis not present

## 2021-10-18 DIAGNOSIS — N390 Urinary tract infection, site not specified: Secondary | ICD-10-CM | POA: Diagnosis not present

## 2021-10-20 DIAGNOSIS — G894 Chronic pain syndrome: Secondary | ICD-10-CM | POA: Diagnosis not present

## 2021-10-20 DIAGNOSIS — M25512 Pain in left shoulder: Secondary | ICD-10-CM | POA: Diagnosis not present

## 2021-10-20 DIAGNOSIS — M25511 Pain in right shoulder: Secondary | ICD-10-CM | POA: Diagnosis not present

## 2021-10-20 DIAGNOSIS — E349 Endocrine disorder, unspecified: Secondary | ICD-10-CM | POA: Diagnosis not present

## 2021-10-29 DIAGNOSIS — Z0130 Encounter for examination of blood pressure without abnormal findings: Secondary | ICD-10-CM | POA: Diagnosis not present

## 2021-10-29 DIAGNOSIS — G950 Syringomyelia and syringobulbia: Secondary | ICD-10-CM | POA: Diagnosis not present

## 2021-10-29 DIAGNOSIS — I10 Essential (primary) hypertension: Secondary | ICD-10-CM | POA: Diagnosis not present

## 2021-10-29 DIAGNOSIS — F419 Anxiety disorder, unspecified: Secondary | ICD-10-CM | POA: Diagnosis not present

## 2021-10-29 DIAGNOSIS — M129 Arthropathy, unspecified: Secondary | ICD-10-CM | POA: Diagnosis not present

## 2021-10-29 DIAGNOSIS — G894 Chronic pain syndrome: Secondary | ICD-10-CM | POA: Diagnosis not present

## 2021-10-29 DIAGNOSIS — G8254 Quadriplegia, C5-C7 incomplete: Secondary | ICD-10-CM | POA: Diagnosis not present

## 2021-10-29 DIAGNOSIS — F1120 Opioid dependence, uncomplicated: Secondary | ICD-10-CM | POA: Diagnosis not present

## 2021-10-29 DIAGNOSIS — Z6824 Body mass index (BMI) 24.0-24.9, adult: Secondary | ICD-10-CM | POA: Diagnosis not present

## 2021-10-29 DIAGNOSIS — F32A Depression, unspecified: Secondary | ICD-10-CM | POA: Diagnosis not present

## 2021-10-29 DIAGNOSIS — Z79899 Other long term (current) drug therapy: Secondary | ICD-10-CM | POA: Diagnosis not present

## 2021-10-29 DIAGNOSIS — G904 Autonomic dysreflexia: Secondary | ICD-10-CM | POA: Diagnosis not present

## 2021-11-04 DIAGNOSIS — F419 Anxiety disorder, unspecified: Secondary | ICD-10-CM | POA: Diagnosis not present

## 2021-11-04 DIAGNOSIS — Z0130 Encounter for examination of blood pressure without abnormal findings: Secondary | ICD-10-CM | POA: Diagnosis not present

## 2021-11-04 DIAGNOSIS — G894 Chronic pain syndrome: Secondary | ICD-10-CM | POA: Diagnosis not present

## 2021-11-04 DIAGNOSIS — G904 Autonomic dysreflexia: Secondary | ICD-10-CM | POA: Diagnosis not present

## 2021-11-04 DIAGNOSIS — G950 Syringomyelia and syringobulbia: Secondary | ICD-10-CM | POA: Diagnosis not present

## 2021-11-04 DIAGNOSIS — R030 Elevated blood-pressure reading, without diagnosis of hypertension: Secondary | ICD-10-CM | POA: Diagnosis not present

## 2021-11-04 DIAGNOSIS — F1120 Opioid dependence, uncomplicated: Secondary | ICD-10-CM | POA: Diagnosis not present

## 2021-11-04 DIAGNOSIS — G8254 Quadriplegia, C5-C7 incomplete: Secondary | ICD-10-CM | POA: Diagnosis not present

## 2021-11-04 DIAGNOSIS — M129 Arthropathy, unspecified: Secondary | ICD-10-CM | POA: Diagnosis not present

## 2021-11-04 DIAGNOSIS — F32A Depression, unspecified: Secondary | ICD-10-CM | POA: Diagnosis not present

## 2021-11-04 DIAGNOSIS — Z79899 Other long term (current) drug therapy: Secondary | ICD-10-CM | POA: Diagnosis not present

## 2021-11-04 DIAGNOSIS — I10 Essential (primary) hypertension: Secondary | ICD-10-CM | POA: Diagnosis not present

## 2021-11-04 DIAGNOSIS — Z6824 Body mass index (BMI) 24.0-24.9, adult: Secondary | ICD-10-CM | POA: Diagnosis not present

## 2021-11-08 DIAGNOSIS — R339 Retention of urine, unspecified: Secondary | ICD-10-CM | POA: Diagnosis not present

## 2021-11-08 DIAGNOSIS — N319 Neuromuscular dysfunction of bladder, unspecified: Secondary | ICD-10-CM | POA: Diagnosis not present

## 2021-12-02 DIAGNOSIS — G8254 Quadriplegia, C5-C7 incomplete: Secondary | ICD-10-CM | POA: Diagnosis not present

## 2021-12-02 DIAGNOSIS — Z79899 Other long term (current) drug therapy: Secondary | ICD-10-CM | POA: Diagnosis not present

## 2021-12-02 DIAGNOSIS — G894 Chronic pain syndrome: Secondary | ICD-10-CM | POA: Diagnosis not present

## 2021-12-02 DIAGNOSIS — G950 Syringomyelia and syringobulbia: Secondary | ICD-10-CM | POA: Diagnosis not present

## 2021-12-02 DIAGNOSIS — F32A Depression, unspecified: Secondary | ICD-10-CM | POA: Diagnosis not present

## 2021-12-02 DIAGNOSIS — I10 Essential (primary) hypertension: Secondary | ICD-10-CM | POA: Diagnosis not present

## 2021-12-02 DIAGNOSIS — Z6824 Body mass index (BMI) 24.0-24.9, adult: Secondary | ICD-10-CM | POA: Diagnosis not present

## 2021-12-02 DIAGNOSIS — F1120 Opioid dependence, uncomplicated: Secondary | ICD-10-CM | POA: Diagnosis not present

## 2021-12-02 DIAGNOSIS — M109 Gout, unspecified: Secondary | ICD-10-CM | POA: Diagnosis not present

## 2021-12-02 DIAGNOSIS — F419 Anxiety disorder, unspecified: Secondary | ICD-10-CM | POA: Diagnosis not present

## 2021-12-02 DIAGNOSIS — G904 Autonomic dysreflexia: Secondary | ICD-10-CM | POA: Diagnosis not present

## 2021-12-02 DIAGNOSIS — Z0130 Encounter for examination of blood pressure without abnormal findings: Secondary | ICD-10-CM | POA: Diagnosis not present

## 2021-12-07 DIAGNOSIS — N319 Neuromuscular dysfunction of bladder, unspecified: Secondary | ICD-10-CM | POA: Diagnosis not present

## 2021-12-07 DIAGNOSIS — R339 Retention of urine, unspecified: Secondary | ICD-10-CM | POA: Diagnosis not present

## 2021-12-10 ENCOUNTER — Other Ambulatory Visit: Payer: Self-pay

## 2021-12-10 ENCOUNTER — Emergency Department: Payer: PPO

## 2021-12-10 ENCOUNTER — Inpatient Hospital Stay
Admission: EM | Admit: 2021-12-10 | Discharge: 2021-12-13 | DRG: 698 | Disposition: A | Discharge status: Home / Self Care | Payer: PPO | Attending: Internal Medicine | Admitting: Internal Medicine

## 2021-12-10 DIAGNOSIS — Z86718 Personal history of other venous thrombosis and embolism: Secondary | ICD-10-CM | POA: Diagnosis not present

## 2021-12-10 DIAGNOSIS — G8250 Quadriplegia, unspecified: Secondary | ICD-10-CM | POA: Diagnosis not present

## 2021-12-10 DIAGNOSIS — N390 Urinary tract infection, site not specified: Secondary | ICD-10-CM | POA: Diagnosis present

## 2021-12-10 DIAGNOSIS — I4891 Unspecified atrial fibrillation: Secondary | ICD-10-CM | POA: Diagnosis present

## 2021-12-10 DIAGNOSIS — I471 Supraventricular tachycardia: Secondary | ICD-10-CM | POA: Diagnosis present

## 2021-12-10 DIAGNOSIS — R6520 Severe sepsis without septic shock: Secondary | ICD-10-CM | POA: Diagnosis present

## 2021-12-10 DIAGNOSIS — I10 Essential (primary) hypertension: Secondary | ICD-10-CM | POA: Diagnosis not present

## 2021-12-10 DIAGNOSIS — E872 Acidosis, unspecified: Secondary | ICD-10-CM | POA: Diagnosis not present

## 2021-12-10 DIAGNOSIS — Z66 Do not resuscitate: Secondary | ICD-10-CM | POA: Diagnosis not present

## 2021-12-10 DIAGNOSIS — I517 Cardiomegaly: Secondary | ICD-10-CM | POA: Diagnosis not present

## 2021-12-10 DIAGNOSIS — G904 Autonomic dysreflexia: Secondary | ICD-10-CM | POA: Diagnosis not present

## 2021-12-10 DIAGNOSIS — Z20822 Contact with and (suspected) exposure to covid-19: Secondary | ICD-10-CM | POA: Diagnosis not present

## 2021-12-10 DIAGNOSIS — Z87891 Personal history of nicotine dependence: Secondary | ICD-10-CM | POA: Diagnosis not present

## 2021-12-10 DIAGNOSIS — L89312 Pressure ulcer of right buttock, stage 2: Secondary | ICD-10-CM | POA: Diagnosis not present

## 2021-12-10 DIAGNOSIS — I483 Typical atrial flutter: Secondary | ICD-10-CM | POA: Diagnosis not present

## 2021-12-10 DIAGNOSIS — N319 Neuromuscular dysfunction of bladder, unspecified: Secondary | ICD-10-CM | POA: Diagnosis not present

## 2021-12-10 DIAGNOSIS — Z7901 Long term (current) use of anticoagulants: Secondary | ICD-10-CM

## 2021-12-10 DIAGNOSIS — L8990 Pressure ulcer of unspecified site, unspecified stage: Secondary | ICD-10-CM | POA: Diagnosis present

## 2021-12-10 DIAGNOSIS — G629 Polyneuropathy, unspecified: Secondary | ICD-10-CM

## 2021-12-10 DIAGNOSIS — Z87440 Personal history of urinary (tract) infections: Secondary | ICD-10-CM | POA: Diagnosis not present

## 2021-12-10 DIAGNOSIS — N312 Flaccid neuropathic bladder, not elsewhere classified: Secondary | ICD-10-CM | POA: Diagnosis present

## 2021-12-10 DIAGNOSIS — T83518A Infection and inflammatory reaction due to other urinary catheter, initial encounter: Secondary | ICD-10-CM | POA: Diagnosis not present

## 2021-12-10 DIAGNOSIS — I4892 Unspecified atrial flutter: Secondary | ICD-10-CM

## 2021-12-10 DIAGNOSIS — A4150 Gram-negative sepsis, unspecified: Secondary | ICD-10-CM | POA: Diagnosis not present

## 2021-12-10 DIAGNOSIS — R4182 Altered mental status, unspecified: Secondary | ICD-10-CM | POA: Diagnosis present

## 2021-12-10 DIAGNOSIS — A419 Sepsis, unspecified organism: Principal | ICD-10-CM | POA: Diagnosis present

## 2021-12-10 DIAGNOSIS — Y846 Urinary catheterization as the cause of abnormal reaction of the patient, or of later complication, without mention of misadventure at the time of the procedure: Secondary | ICD-10-CM | POA: Diagnosis present

## 2021-12-10 DIAGNOSIS — Z803 Family history of malignant neoplasm of breast: Secondary | ICD-10-CM

## 2021-12-10 DIAGNOSIS — N400 Enlarged prostate without lower urinary tract symptoms: Secondary | ICD-10-CM

## 2021-12-10 DIAGNOSIS — R918 Other nonspecific abnormal finding of lung field: Secondary | ICD-10-CM | POA: Diagnosis not present

## 2021-12-10 LAB — URINALYSIS, COMPLETE (UACMP) WITH MICROSCOPIC
Bilirubin Urine: NEGATIVE
Glucose, UA: 50 mg/dL — AB
Ketones, ur: NEGATIVE mg/dL
Leukocytes,Ua: NEGATIVE
Nitrite: NEGATIVE
Protein, ur: 100 mg/dL — AB
RBC / HPF: >50 RBC/hpf — ABNORMAL HIGH (ref 0–5)
Specific Gravity, Urine: 1.014 (ref 1.005–1.030)
WBC, UA: >50 WBC/hpf — ABNORMAL HIGH (ref 0–5)
pH: 6 (ref 5.0–8.0)

## 2021-12-10 LAB — CBC WITH DIFFERENTIAL/PLATELET
Abs Immature Granulocytes: 0.12 10*3/uL — ABNORMAL HIGH (ref 0.00–0.07)
Basophils Absolute: 0.1 10*3/uL (ref 0.0–0.1)
Basophils Relative: 0 %
Eosinophils Absolute: 2.1 10*3/uL — ABNORMAL HIGH (ref 0.0–0.5)
Eosinophils Relative: 11 %
HCT: 58.8 % — ABNORMAL HIGH (ref 39.0–52.0)
Hemoglobin: 19 g/dL — ABNORMAL HIGH (ref 13.0–17.0)
Immature Granulocytes: 1 %
Lymphocytes Relative: 3 %
Lymphs Abs: 0.6 10*3/uL — ABNORMAL LOW (ref 0.7–4.0)
MCH: 32.6 pg (ref 26.0–34.0)
MCHC: 32.3 g/dL (ref 30.0–36.0)
MCV: 100.9 fL — ABNORMAL HIGH (ref 80.0–100.0)
Monocytes Absolute: 2.2 10*3/uL — ABNORMAL HIGH (ref 0.1–1.0)
Monocytes Relative: 11 %
Neutro Abs: 14.9 10*3/uL — ABNORMAL HIGH (ref 1.7–7.7)
Neutrophils Relative %: 74 %
Platelets: 225 10*3/uL (ref 150–400)
RBC: 5.83 MIL/uL — ABNORMAL HIGH (ref 4.22–5.81)
RDW: 14.1 % (ref 11.5–15.5)
WBC: 19.9 10*3/uL — ABNORMAL HIGH (ref 4.0–10.5)
nRBC: 0 % (ref 0.0–0.2)

## 2021-12-10 LAB — COMPREHENSIVE METABOLIC PANEL
ALT: 26 U/L (ref 0–44)
AST: 33 U/L (ref 15–41)
Albumin: 3.5 g/dL (ref 3.5–5.0)
Alkaline Phosphatase: 54 U/L (ref 38–126)
Anion gap: 11 (ref 5–15)
BUN: 15 mg/dL (ref 8–23)
CO2: 25 mmol/L (ref 22–32)
Calcium: 8.7 mg/dL — ABNORMAL LOW (ref 8.9–10.3)
Chloride: 112 mmol/L — ABNORMAL HIGH (ref 98–111)
Creatinine, Ser: 0.98 mg/dL (ref 0.61–1.24)
GFR, Estimated: >60 mL/min (ref >60)
Glucose, Bld: 162 mg/dL — ABNORMAL HIGH (ref 70–99)
Potassium: 3.6 mmol/L (ref 3.5–5.1)
Sodium: 148 mmol/L — ABNORMAL HIGH (ref 135–145)
Total Bilirubin: 0.9 mg/dL (ref 0.3–1.2)
Total Protein: 6.4 g/dL — ABNORMAL LOW (ref 6.5–8.1)

## 2021-12-10 LAB — LACTIC ACID, PLASMA: Lactic Acid, Venous: 3.9 mmol/L (ref 0.5–1.9)

## 2021-12-10 LAB — APTT: aPTT: 31 seconds (ref 24–36)

## 2021-12-10 LAB — PROTIME-INR
INR: 1.3 — ABNORMAL HIGH (ref 0.8–1.2)
Prothrombin Time: 15.8 seconds — ABNORMAL HIGH (ref 11.4–15.2)

## 2021-12-10 MED ORDER — LACTATED RINGERS IV BOLUS (SEPSIS)
1000.0000 mL | Freq: Once | INTRAVENOUS | Status: AC
  Administered 2021-12-10: 1000 mL via INTRAVENOUS

## 2021-12-10 MED ORDER — LACTATED RINGERS IV BOLUS (SEPSIS)
250.0000 mL | Freq: Once | INTRAVENOUS | Status: AC
  Administered 2021-12-11: 250 mL via INTRAVENOUS

## 2021-12-10 MED ORDER — SODIUM CHLORIDE 0.9 % IV SOLN
2.0000 g | Freq: Once | INTRAVENOUS | Status: AC
  Administered 2021-12-10: 2 g via INTRAVENOUS
  Filled 2021-12-10: qty 12.5

## 2021-12-10 MED ORDER — LACTATED RINGERS IV SOLN: INTRAVENOUS | Status: AC

## 2021-12-10 NOTE — Progress Notes (Signed)
CODE SEPSIS - PHARMACY COMMUNICATION  **Broad Spectrum Antibiotics should be administered within 1 hour of Sepsis diagnosis**  Time Code Sepsis Called/Page Received: 2237  Antibiotics Ordered: Cefepime  Time of 1st antibiotic administration: 2304  Otelia Sergeant, PharmD, [facility] 12/10/2021 10:41 PM

## 2021-12-10 NOTE — ED Triage Notes (Signed)
EMS reports wife called due to "change in mental status."  Pt HR upon arrival 158. Pt is diaphoretic, feels "feverish and chilled." Pt suspects UTI has Hx of same.

## 2021-12-10 NOTE — Sepsis Progress Note (Signed)
Following for sepsis monitoring ?

## 2021-12-10 NOTE — ED Provider Notes (Signed)
[facility] Provider Note    Event Date/Time   First MD Initiated Contact with Patient 12/10/21 2240     (approximate)   History   Feels cold and feels like he might have a UTI   HPI  Jesse Whitney is a 66 y.o. male paraplegic who cannot feel below his shoulders comes in complaining of feeling cold and sweaty and shaky.  Symptoms started today.  He feels like this sometimes when he has UTI.  Catheterizes himself 4 times a day.  He is not short of breath or coughing.     Physical Exam   Triage Vital Signs: ED Triage Vitals  Enc Vitals Group     BP      Pulse      Resp      Temp      Temp src      SpO2      Weight      Height      Head Circumference      Peak Flow      Pain Score      Pain Loc      Pain Edu?      Excl. in GC?     Most recent vital signs: Vitals:   12/11/21 0032 12/11/21 0113  BP: 100/88 111/88  Pulse: 64 76  Resp: 20 18  Temp: 98.3 F (36.8 C)   SpO2: 93% 92%    General: Awake, alert but shivering and very sweaty CV:  Good peripheral perfusion.  Heart very tacky no audible murmurs Resp:  Normal effort.  Lungs are clear Abd:  No distention.  Nontender Patient has a healing decubitus is almost healed on his right posterior hip.  There is no sign of infection there   ED Results / Procedures / Treatments   Labs (all labs ordered are listed, but only abnormal results are displayed) Labs Reviewed  LACTIC ACID, PLASMA - Abnormal; Notable for the following components:      Result Value   Lactic Acid, Venous 3.9 (*)    All other components within normal limits  LACTIC ACID, PLASMA - Abnormal; Notable for the following components:   Lactic Acid, Venous 2.3 (*)    All other components within normal limits  COMPREHENSIVE METABOLIC PANEL - Abnormal; Notable for the following components:   Sodium 148 (*)    Chloride 112 (*)    Glucose, Bld 162 (*)    Calcium 8.7 (*)    Total Protein 6.4 (*)    All other  components within normal limits  CBC WITH DIFFERENTIAL/PLATELET - Abnormal; Notable for the following components:   WBC 19.9 (*)    RBC 5.83 (*)    Hemoglobin 19.0 (*)    HCT 58.8 (*)    MCV 100.9 (*)    Neutro Abs 14.9 (*)    Lymphs Abs 0.6 (*)    Monocytes Absolute 2.2 (*)    Eosinophils Absolute 2.1 (*)    Abs Immature Granulocytes 0.12 (*)    All other components within normal limits  PROTIME-INR - Abnormal; Notable for the following components:   Prothrombin Time 15.8 (*)    INR 1.3 (*)    All other components within normal limits  URINALYSIS, COMPLETE (UACMP) WITH MICROSCOPIC - Abnormal; Notable for the following components:   Color, Urine AMBER (*)    APPearance CLOUDY (*)    Glucose, UA 50 (*)    Hgb urine dipstick LARGE (*)  Protein, ur 100 (*)    RBC / HPF >50 (*)    WBC, UA >50 (*)    Bacteria, UA RARE (*)    All other components within normal limits  RESP PANEL BY RT-PCR (FLU A&B, COVID) ARPGX2  CULTURE, BLOOD (ROUTINE X 2)  CULTURE, BLOOD (ROUTINE X 2)  URINE CULTURE  APTT     EKG  EKG read interpreted by me shows what appears to be a flutter although it is very difficult to tell because there is a lot of artifact.  Heart rate of 04/07/1957.  Normal axis no obvious acute ST-T changes EKG #2 read interpreted by me shows a flutter at a rate of 132 normal axis still a lot of artifact but no obvious ST-T changes  RADIOLOGY Chest x-ray read interpreted by me and read by radiology shows some hyperinflation cardiomegaly but no infiltrates or CHF   PROCEDURES:  Critical Care performed: Critical care time 45 minutes.  This includes talking to the patient examining very carefully talking to his family members and managing his heart rate and blood pressure which then went up and his sepsis.  We still have to get a second lactic acid.  He is being treated for sepsis related to urinary tract.  White count was elevated 19.974% polys he may be slightly dehydrated with a  sodium 148.  His COVID is negative.  He has bacteria and white cells and red cells in his urine.  Procedures   MEDICATIONS ORDERED IN ED: Medications  lactated ringers infusion ( Intravenous New Bag/Given 12/11/21 0113)  diltiazem (CARDIZEM) 25 MG/5ML injection (has no administration in time range)  diltiazem (CARDIZEM) 125 mg in dextrose 5% 125 mL (1 mg/mL) infusion (has no administration in time range)  morphine (PF) 2 MG/ML injection 2 mg (has no administration in time range)  LORazepam (ATIVAN) injection 0.5 mg (has no administration in time range)  traZODone (DESYREL) tablet 25 mg (has no administration in time range)  diltiazem (CARDIZEM) injection 5 mg (has no administration in time range)  lactated ringers bolus 1,000 mL (0 mLs Intravenous Stopped 12/10/21 2345)    And  lactated ringers bolus 1,000 mL (0 mLs Intravenous Stopped 12/11/21 0013)    And  lactated ringers bolus 250 mL (0 mLs Intravenous Stopped 12/11/21 0019)  ceFEPIme (MAXIPIME) 2 g in sodium chloride 0.9 % 100 mL IVPB (0 g Intravenous Stopped 12/10/21 2345)  diltiazem (CARDIZEM) injection 5 mg (5 mg Intravenous Given 12/11/21 0010)     IMPRESSION / MDM / ASSESSMENT AND PLAN / ED COURSE  I reviewed the triage vital signs and the nursing notes. Patient received fluids and an attempt to see if his rapid heart rate would come down.  It did not but his blood pressure ended up going up somewhat and his sweating got better.  He then got some diltiazem as his pressure went up even higher and that slowed his heart rate and his blood pressure.  He is now doing much better and feeling better.  He has received antibiotics.  We are wondering if some of his tachycardia and elevated blood pressure might be related to his paraplegia and sympathetic nervous system causes.   Patient's presentation is most consistent with acute presentation with potential threat to life or bodily function.  The patient is on the cardiac monitor to  evaluate for evidence of arrhythmia and/or significant heart rate changes.  Patient with a flutter with rapid ventricular response      FINAL  CLINICAL IMPRESSION(S) / ED DIAGNOSES   Final diagnoses:  Sepsis, due to unspecified organism, unspecified whether acute organ dysfunction present [facility])  Atrial flutter with rapid ventricular response (HCC)  Urinary tract infection with hematuria, site unspecified  Episode of hypertension     Rx / DC Orders   ED Discharge Orders     None        Note:  This document was prepared using Dragon voice recognition software and may include unintentional dictation errors.   Arnaldo Natal, MD 12/11/21 [phone]

## 2021-12-11 ENCOUNTER — Inpatient Hospital Stay
Admit: 2021-12-11 | Discharge: 2021-12-11 | Disposition: A | Discharge status: Home / Self Care | Payer: PPO | Attending: Cardiology | Admitting: Cardiology

## 2021-12-11 DIAGNOSIS — Z86718 Personal history of other venous thrombosis and embolism: Secondary | ICD-10-CM | POA: Diagnosis not present

## 2021-12-11 DIAGNOSIS — R6520 Severe sepsis without septic shock: Secondary | ICD-10-CM

## 2021-12-11 DIAGNOSIS — G8250 Quadriplegia, unspecified: Secondary | ICD-10-CM | POA: Diagnosis present

## 2021-12-11 DIAGNOSIS — A4150 Gram-negative sepsis, unspecified: Secondary | ICD-10-CM | POA: Diagnosis present

## 2021-12-11 DIAGNOSIS — N390 Urinary tract infection, site not specified: Secondary | ICD-10-CM | POA: Diagnosis present

## 2021-12-11 DIAGNOSIS — G904 Autonomic dysreflexia: Secondary | ICD-10-CM

## 2021-12-11 DIAGNOSIS — N312 Flaccid neuropathic bladder, not elsewhere classified: Secondary | ICD-10-CM

## 2021-12-11 DIAGNOSIS — I4892 Unspecified atrial flutter: Secondary | ICD-10-CM

## 2021-12-11 DIAGNOSIS — T83518A Infection and inflammatory reaction due to other urinary catheter, initial encounter: Secondary | ICD-10-CM | POA: Diagnosis present

## 2021-12-11 DIAGNOSIS — Y846 Urinary catheterization as the cause of abnormal reaction of the patient, or of later complication, without mention of misadventure at the time of the procedure: Secondary | ICD-10-CM | POA: Diagnosis present

## 2021-12-11 DIAGNOSIS — L89312 Pressure ulcer of right buttock, stage 2: Secondary | ICD-10-CM | POA: Diagnosis present

## 2021-12-11 DIAGNOSIS — Z7901 Long term (current) use of anticoagulants: Secondary | ICD-10-CM | POA: Diagnosis not present

## 2021-12-11 DIAGNOSIS — Z66 Do not resuscitate: Secondary | ICD-10-CM | POA: Diagnosis present

## 2021-12-11 DIAGNOSIS — I4891 Unspecified atrial fibrillation: Secondary | ICD-10-CM | POA: Diagnosis present

## 2021-12-11 DIAGNOSIS — L8990 Pressure ulcer of unspecified site, unspecified stage: Secondary | ICD-10-CM | POA: Diagnosis present

## 2021-12-11 DIAGNOSIS — A419 Sepsis, unspecified organism: Secondary | ICD-10-CM

## 2021-12-11 DIAGNOSIS — Z87891 Personal history of nicotine dependence: Secondary | ICD-10-CM | POA: Diagnosis not present

## 2021-12-11 DIAGNOSIS — Z87440 Personal history of urinary (tract) infections: Secondary | ICD-10-CM | POA: Diagnosis not present

## 2021-12-11 DIAGNOSIS — Z803 Family history of malignant neoplasm of breast: Secondary | ICD-10-CM | POA: Diagnosis not present

## 2021-12-11 DIAGNOSIS — R4182 Altered mental status, unspecified: Secondary | ICD-10-CM | POA: Diagnosis present

## 2021-12-11 DIAGNOSIS — I10 Essential (primary) hypertension: Secondary | ICD-10-CM | POA: Diagnosis present

## 2021-12-11 DIAGNOSIS — Z20822 Contact with and (suspected) exposure to covid-19: Secondary | ICD-10-CM | POA: Diagnosis present

## 2021-12-11 DIAGNOSIS — I471 Supraventricular tachycardia: Secondary | ICD-10-CM | POA: Diagnosis present

## 2021-12-11 DIAGNOSIS — N400 Enlarged prostate without lower urinary tract symptoms: Secondary | ICD-10-CM | POA: Diagnosis present

## 2021-12-11 DIAGNOSIS — N319 Neuromuscular dysfunction of bladder, unspecified: Secondary | ICD-10-CM | POA: Diagnosis present

## 2021-12-11 DIAGNOSIS — E872 Acidosis, unspecified: Secondary | ICD-10-CM | POA: Diagnosis present

## 2021-12-11 DIAGNOSIS — G629 Polyneuropathy, unspecified: Secondary | ICD-10-CM

## 2021-12-11 LAB — RESP PANEL BY RT-PCR (FLU A&B, COVID) ARPGX2
Influenza A by PCR: NEGATIVE
Influenza B by PCR: NEGATIVE
SARS Coronavirus 2 by RT PCR: NEGATIVE

## 2021-12-11 LAB — CBC
HCT: 50.8 % (ref 39.0–52.0)
Hemoglobin: 17.1 g/dL — ABNORMAL HIGH (ref 13.0–17.0)
MCH: 33.7 pg (ref 26.0–34.0)
MCHC: 33.7 g/dL (ref 30.0–36.0)
MCV: 100 fL (ref 80.0–100.0)
Platelets: 199 10*3/uL (ref 150–400)
RBC: 5.08 MIL/uL (ref 4.22–5.81)
RDW: 14.3 % (ref 11.5–15.5)
WBC: 15.3 10*3/uL — ABNORMAL HIGH (ref 4.0–10.5)
nRBC: 0 % (ref 0.0–0.2)

## 2021-12-11 LAB — ECHOCARDIOGRAM COMPLETE
AR max vel: 2.81 cm2
AV Area VTI: 3.03 cm2
AV Area mean vel: 2.82 cm2
AV Mean grad: 3 mmHg
AV Peak grad: 5.7 mmHg
Ao pk vel: 1.19 m/s
Area-P 1/2: 5.31 cm2
Height: 68 in
S' Lateral: 2.91 cm
Weight: 2589.08 oz

## 2021-12-11 LAB — PROTIME-INR
INR: 1.2 (ref 0.8–1.2)
Prothrombin Time: 15.2 seconds (ref 11.4–15.2)

## 2021-12-11 LAB — BASIC METABOLIC PANEL
Anion gap: 9 (ref 5–15)
BUN: 14 mg/dL (ref 8–23)
CO2: 25 mmol/L (ref 22–32)
Calcium: 8.3 mg/dL — ABNORMAL LOW (ref 8.9–10.3)
Chloride: 109 mmol/L (ref 98–111)
Creatinine, Ser: 0.78 mg/dL (ref 0.61–1.24)
GFR, Estimated: >60 mL/min (ref >60)
Glucose, Bld: 117 mg/dL — ABNORMAL HIGH (ref 70–99)
Potassium: 4.2 mmol/L (ref 3.5–5.1)
Sodium: 143 mmol/L (ref 135–145)

## 2021-12-11 LAB — LACTIC ACID, PLASMA
Lactic Acid, Venous: 2.3 mmol/L (ref 0.5–1.9)
Lactic Acid, Venous: 1.1 mmol/L (ref 0.5–1.9)

## 2021-12-11 LAB — CORTISOL-AM, BLOOD: Cortisol - AM: 17.8 ug/dL (ref 6.7–22.6)

## 2021-12-11 LAB — PROCALCITONIN: Procalcitonin: 0.23 ng/mL

## 2021-12-11 LAB — GLUCOSE, CAPILLARY: Glucose-Capillary: 132 mg/dL — ABNORMAL HIGH (ref 70–99)

## 2021-12-11 LAB — MAGNESIUM: Magnesium: 1.8 mg/dL (ref 1.7–2.4)

## 2021-12-11 LAB — TSH: TSH: 1.855 u[IU]/mL (ref 0.350–4.500)

## 2021-12-11 MED ORDER — DILTIAZEM HCL 25 MG/5ML IV SOLN
INTRAVENOUS | Status: AC
  Administered 2021-12-11: 5 mg via INTRAVENOUS
  Filled 2021-12-11: qty 5

## 2021-12-11 MED ORDER — DIAZEPAM 5 MG PO TABS
5.0000 mg | ORAL_TABLET | Freq: Four times a day (QID) | ORAL | Status: DC | PRN
Start: 2021-12-11 — End: 2021-12-13
  Administered 2021-12-12 – 2021-12-13 (×3): 5 mg via ORAL
  Filled 2021-12-11 (×3): qty 1

## 2021-12-11 MED ORDER — SODIUM CHLORIDE 0.9 % IV SOLN
INTRAVENOUS | Status: DC
  Administered 2021-12-11: 100 mL/h via INTRAVENOUS

## 2021-12-11 MED ORDER — APIXABAN 5 MG PO TABS
5.0000 mg | ORAL_TABLET | Freq: Two times a day (BID) | ORAL | Status: DC
  Administered 2021-12-11 – 2021-12-13 (×4): 5 mg via ORAL
  Filled 2021-12-11 (×4): qty 1

## 2021-12-11 MED ORDER — ONDANSETRON HCL 4 MG/2ML IJ SOLN: 4.0000 mg | Freq: Four times a day (QID) | INTRAMUSCULAR | Status: DC | PRN

## 2021-12-11 MED ORDER — MAGNESIUM HYDROXIDE 400 MG/5ML PO SUSP: 30.0000 mL | Freq: Every day | ORAL | Status: DC | PRN

## 2021-12-11 MED ORDER — APIXABAN 2.5 MG PO TABS: 2.5000 mg | ORAL_TABLET | Freq: Two times a day (BID) | ORAL | Status: DC

## 2021-12-11 MED ORDER — ACETAMINOPHEN 650 MG RE SUPP: 650.0000 mg | Freq: Four times a day (QID) | RECTAL | Status: DC | PRN

## 2021-12-11 MED ORDER — DIGOXIN 0.25 MG/ML IJ SOLN
0.2500 mg | Freq: Once | INTRAMUSCULAR | Status: AC
  Administered 2021-12-11: 0.25 mg via INTRAVENOUS
  Filled 2021-12-11: qty 2

## 2021-12-11 MED ORDER — LORAZEPAM 2 MG/ML IJ SOLN
0.5000 mg | INTRAMUSCULAR | Status: DC | PRN
  Administered 2021-12-11: 0.5 mg via INTRAVENOUS
  Filled 2021-12-11: qty 1

## 2021-12-11 MED ORDER — MIDODRINE HCL 5 MG PO TABS
10.0000 mg | ORAL_TABLET | Freq: Three times a day (TID) | ORAL | Status: DC
  Administered 2021-12-11 – 2021-12-12 (×4): 10 mg via ORAL
  Filled 2021-12-11 (×4): qty 2

## 2021-12-11 MED ORDER — DILTIAZEM HCL 25 MG/5ML IV SOLN
5.0000 mg | Freq: Once | INTRAVENOUS | Status: AC
  Administered 2021-12-11: 5 mg via INTRAVENOUS

## 2021-12-11 MED ORDER — MAGNESIUM SULFATE 2 GM/50ML IV SOLN
2.0000 g | Freq: Once | INTRAVENOUS | Status: AC
Start: 2021-12-11 — End: 2021-12-11
  Administered 2021-12-11: 2 g via INTRAVENOUS
  Filled 2021-12-11: qty 50

## 2021-12-11 MED ORDER — APIXABAN 2.5 MG PO TABS
2.5000 mg | ORAL_TABLET | Freq: Two times a day (BID) | ORAL | Status: DC
  Administered 2021-12-11: 2.5 mg via ORAL
  Filled 2021-12-11 (×2): qty 1

## 2021-12-11 MED ORDER — SODIUM CHLORIDE 0.9 % IV BOLUS
250.0000 mL | Freq: Once | INTRAVENOUS | Status: AC
  Administered 2021-12-11: 250 mL via INTRAVENOUS

## 2021-12-11 MED ORDER — MORPHINE SULFATE ER 15 MG PO TBCR
60.0000 mg | EXTENDED_RELEASE_TABLET | Freq: Two times a day (BID) | ORAL | Status: DC
  Administered 2021-12-11 – 2021-12-13 (×5): 60 mg via ORAL
  Filled 2021-12-11 (×5): qty 4

## 2021-12-11 MED ORDER — DILTIAZEM HCL 25 MG/5ML IV SOLN: 5.0000 mg | Freq: Once | INTRAVENOUS | Status: AC

## 2021-12-11 MED ORDER — SODIUM CHLORIDE 0.9 % IV SOLN
2.0000 g | INTRAVENOUS | Status: DC
  Administered 2021-12-11 – 2021-12-12 (×2): 2 g via INTRAVENOUS
  Filled 2021-12-11 (×3): qty 20

## 2021-12-11 MED ORDER — DILTIAZEM HCL ER COATED BEADS 120 MG PO CP24
120.0000 mg | ORAL_CAPSULE | Freq: Every day | ORAL | Status: DC
  Administered 2021-12-11 – 2021-12-12 (×2): 120 mg via ORAL
  Filled 2021-12-11 (×3): qty 1

## 2021-12-11 MED ORDER — OXYCODONE HCL 5 MG PO TABS
15.0000 mg | ORAL_TABLET | Freq: Three times a day (TID) | ORAL | Status: DC | PRN
  Administered 2021-12-12 – 2021-12-13 (×2): 15 mg via ORAL
  Filled 2021-12-11 (×2): qty 3

## 2021-12-11 MED ORDER — NOREPINEPHRINE 4 MG/250ML-% IV SOLN
0.0000 ug/min | INTRAVENOUS | Status: DC
  Filled 2021-12-11: qty 250

## 2021-12-11 MED ORDER — ALLOPURINOL 100 MG PO TABS
100.0000 mg | ORAL_TABLET | Freq: Every day | ORAL | Status: DC
  Administered 2021-12-11 – 2021-12-13 (×3): 100 mg via ORAL
  Filled 2021-12-11 (×3): qty 1

## 2021-12-11 MED ORDER — POTASSIUM CHLORIDE 20 MEQ PO PACK
40.0000 meq | PACK | Freq: Once | ORAL | Status: AC
  Administered 2021-12-11: 40 meq via ORAL
  Filled 2021-12-11: qty 2

## 2021-12-11 MED ORDER — METOPROLOL TARTRATE 5 MG/5ML IV SOLN
2.5000 mg | INTRAVENOUS | Status: DC | PRN
  Administered 2021-12-12: 2.5 mg via INTRAVENOUS
  Filled 2021-12-11: qty 5

## 2021-12-11 MED ORDER — TRAZODONE HCL 50 MG PO TABS
25.0000 mg | ORAL_TABLET | Freq: Every evening | ORAL | Status: DC | PRN
  Administered 2021-12-13: 25 mg via ORAL
  Filled 2021-12-11: qty 1

## 2021-12-11 MED ORDER — LORAZEPAM 2 MG/ML IJ SOLN: 0.5000 mg | INTRAMUSCULAR | Status: DC | PRN

## 2021-12-11 MED ORDER — DILTIAZEM HCL-DEXTROSE 125-5 MG/125ML-% IV SOLN (PREMIX)
5.0000 mg/h | INTRAVENOUS | Status: AC
  Administered 2021-12-11 (×2): 5 mg/h via INTRAVENOUS
  Filled 2021-12-11: qty 125

## 2021-12-11 MED ORDER — ONDANSETRON HCL 4 MG PO TABS: 4.0000 mg | ORAL_TABLET | Freq: Four times a day (QID) | ORAL | Status: DC | PRN

## 2021-12-11 MED ORDER — MORPHINE SULFATE (PF) 2 MG/ML IV SOLN
2.0000 mg | INTRAVENOUS | Status: DC | PRN
  Administered 2021-12-11: 2 mg via INTRAVENOUS
  Filled 2021-12-11: qty 1

## 2021-12-11 MED ORDER — TRAZODONE HCL 50 MG PO TABS: 25.0000 mg | ORAL_TABLET | Freq: Every evening | ORAL | Status: DC | PRN

## 2021-12-11 MED ORDER — ACETAMINOPHEN 325 MG PO TABS: 650.0000 mg | ORAL_TABLET | Freq: Four times a day (QID) | ORAL | Status: DC | PRN

## 2021-12-11 NOTE — ED Notes (Signed)
Transfer of care report called to Marvis Repress RN and RN transported Pt to ICU.

## 2021-12-11 NOTE — ED Notes (Signed)
Admitting MD made aware of pt's inability to tolerate diltiazem drip. Pt has received a total of 3250 IVF and remains hypotensive. Awaiting orders from admitting MD.

## 2021-12-11 NOTE — Assessment & Plan Note (Addendum)
Patient spontaneously converted back to sinus rhythm this morning.  Received Cardizem infusion and a dose of digoxin. Cardiology is on board -Cardiology planning to start him on p.o. Cardizem before discontinuing Cardizem infusion. -CHA2DS2-VASc score of 2, history of prior DVT. Patient was on 2.5 mg twice daily Eliquis which was increased to 5 mg twice daily.

## 2021-12-11 NOTE — ED Notes (Signed)
Pt HR continues to intermittently increase to 160.  HR >160 for approximately 5 minutes, RN pushed 5 mg Diltiazem and HR decreased to 126 after 2 minutes.    Pt is still speaking full sentences and is joking with RN and spouse.  Pt continues to c/o feeling cold and is extremely diaphoretic.

## 2021-12-11 NOTE — Assessment & Plan Note (Signed)
-   We will continue his Flomax. 

## 2021-12-11 NOTE — Hospital Course (Addendum)
Taken from H&P.  Jesse Whitney is a 66 y.o. 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  He has been having nausea and vomiting.  His self cath about 4 times a day and stated that he had similar experience with previous UTI.  No chest pain or palpitations.  No cough or wheezing.   ED Course: When he came to the ER, blood pressure was 115/92 with a heart rate that was up to 162 and otherwise unremarkable vital signs.  Labs revealed sodium 148 with chloride of 112, glucose of 162 and calcium 8.7 with total protein 6.4, lactic acid 2.3 and CBC showed leukocytosis of 19.9 with neutrophilia and significant hemoconcentration.  Influenza antigens and COVID-19 PCR came back negative.  INR is 1.3 and PT 15.8 and UA was strongly positive for UTI.  Urine culture was sent EKG as reviewed by me : EKG showed supraventricular tachycardia with a rate of 159.  Repeat EKG later on revealed of atrial flutter pattern with a rate of 132. Imaging: Portable chest ray showed hyperinflation, cardiomegaly with no acute cardiopulmonary disease.   The patient was given 5 mg of IV Cardizem, and was later started on IV Cardizem drip.  Is also given IV cefepime and 2.25 L bolus of IV lactated Ringer followed by 150 mill per hour.   9/11: Patient did received a dose of digoxin with some improvement in heart rate.  Due to persistent hypotension despite getting more than 3 L of fluid, pressors with Levophed was ordered.  He was also started on midodrine.  Blood pressure improved.  Patient never started on Levophed. Converted back to sinus rhythm around 8 AM today. Cardiology was consulted.  Echocardiogram ordered by cardiology  Preliminary blood cultures negative in 12 hours, urine  cultures pending.  Some improvement in leukocytosis but all cell lines decreased, most likely some dilutional effect. Procalcitonin at 0.23.  Lactic acidosis resolved.  TSH normal  9/12: Hemodynamically stable.  Remained in sinus rhythm.  Labs improving.  Preliminary blood cultures remain negative. Urine cultures pending.  Patient continued to have some shakiness, talked with wife and according to her that he become very shaky if does not take home Valium every 6 hourly.  At home it is listed as every 6 as needed which he was getting it here but never asked.

## 2021-12-11 NOTE — Assessment & Plan Note (Addendum)
-   He will have a Foley catheter while he is here. - We will continue his oxybutynin.
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
## 2021-12-11 NOTE — Assessment & Plan Note (Addendum)
Patient met sepsis criteria with leukocytosis, tachycardia and tachypnea.  Severe sepsis with lactic acidosis at 3.2.  Tachycardia can be explained with atrial flutter with RVR. Received appropriate IV fluid and was started on midodrine due to persistent hypotension. Procalcitonin at 0.23, preliminary blood cultures negative and urine cultures pending. Patient is high risk for UTI due to history of self-catheterization. -Continue with ceftriaxone -Follow-up urine cultures
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
## 2021-12-11 NOTE — Assessment & Plan Note (Signed)
-   Pain management will be provided. 

## 2021-12-11 NOTE — ED Notes (Signed)
MD made aware of increase in patient's HR after adminstration of digoxin

## 2021-12-11 NOTE — ED Notes (Signed)
This RN and another RN attempted to obtain manual blood pressure to verify automatic reading.  Both RN's were unable to obtain manual BP.   Pt diaphoresis and tremors have decreased.

## 2021-12-11 NOTE — Progress Notes (Signed)
Patient arrived to the unit, VSS, comfort afforded. Will continue to monitor.

## 2021-12-11 NOTE — Progress Notes (Addendum)
[id number] Patients cardiac rhythm converted from A Flutter to NSR. 1000 Noted to have multiple scabbed abrasions and ecchymosis on bilateral legs. Wife reports patient does not watch where he drives his wheel chair in the house and yard. Patient also has bilateral foot drop and bruising on feet. Pressure reducing boots placed on patients feet bilaterally. 1200 Patient constantly apologizing for "bothering " the nurseing staff. Explained that nursing staff are here to help him get better regardless of what we must do.

## 2021-12-11 NOTE — Assessment & Plan Note (Signed)
-   We will continue his Neurontin ?
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
## 2021-12-11 NOTE — Assessment & Plan Note (Signed)
Pressure Injury 12/11/21 Buttocks Right;Lower Stage 2 -  Partial thickness loss of dermis presenting as a shallow open injury with a red, pink wound bed without slough. (Active)  12/11/21 0610  Location: Buttocks  Location Orientation: Right;Lower  Staging: Stage 2 -  Partial thickness loss of dermis presenting as a shallow open injury with a red, pink wound bed without slough.  Wound Description (Comments):   Present on Admission: Yes

## 2021-12-12 DIAGNOSIS — N390 Urinary tract infection, site not specified: Secondary | ICD-10-CM | POA: Diagnosis not present

## 2021-12-12 DIAGNOSIS — A4150 Gram-negative sepsis, unspecified: Secondary | ICD-10-CM | POA: Diagnosis not present

## 2021-12-12 LAB — BASIC METABOLIC PANEL
Anion gap: 8 (ref 5–15)
BUN: 12 mg/dL (ref 8–23)
CO2: 27 mmol/L (ref 22–32)
Calcium: 7.9 mg/dL — ABNORMAL LOW (ref 8.9–10.3)
Chloride: 108 mmol/L (ref 98–111)
Creatinine, Ser: 0.66 mg/dL (ref 0.61–1.24)
GFR, Estimated: >60 mL/min (ref >60)
Glucose, Bld: 91 mg/dL (ref 70–99)
Potassium: 3.6 mmol/L (ref 3.5–5.1)
Sodium: 143 mmol/L (ref 135–145)

## 2021-12-12 LAB — CBC
HCT: 43.6 % (ref 39.0–52.0)
Hemoglobin: 14.4 g/dL (ref 13.0–17.0)
MCH: 33.5 pg (ref 26.0–34.0)
MCHC: 33 g/dL (ref 30.0–36.0)
MCV: 101.4 fL — ABNORMAL HIGH (ref 80.0–100.0)
Platelets: 178 10*3/uL (ref 150–400)
RBC: 4.3 MIL/uL (ref 4.22–5.81)
RDW: 14.4 % (ref 11.5–15.5)
WBC: 11 10*3/uL — ABNORMAL HIGH (ref 4.0–10.5)
nRBC: 0 % (ref 0.0–0.2)

## 2021-12-12 LAB — GLUCOSE, CAPILLARY: Glucose-Capillary: 83 mg/dL (ref 70–99)

## 2021-12-12 LAB — HIV ANTIBODY (ROUTINE TESTING W REFLEX): HIV Screen 4th Generation wRfx: NONREACTIVE

## 2021-12-12 MED ORDER — METOPROLOL TARTRATE 25 MG PO TABS
12.5000 mg | ORAL_TABLET | Freq: Two times a day (BID) | ORAL | Status: DC
  Administered 2021-12-12 – 2021-12-13 (×3): 12.5 mg via ORAL
  Filled 2021-12-12 (×3): qty 1

## 2021-12-12 MED ORDER — POTASSIUM CHLORIDE CRYS ER 20 MEQ PO TBCR
40.0000 meq | EXTENDED_RELEASE_TABLET | Freq: Once | ORAL | Status: AC
  Administered 2021-12-12: 40 meq via ORAL
  Filled 2021-12-12: qty 2

## 2021-12-12 MED ORDER — FLEET ENEMA 7-19 GM/118ML RE ENEM
1.0000 | ENEMA | Freq: Once | RECTAL | Status: AC
Start: 2021-12-12 — End: 2021-12-12
  Administered 2021-12-12: 1 via RECTAL

## 2021-12-12 MED ORDER — DILTIAZEM HCL ER COATED BEADS 180 MG PO CP24
180.0000 mg | ORAL_CAPSULE | Freq: Every day | ORAL | Status: DC
  Administered 2021-12-13: 180 mg via ORAL
  Filled 2021-12-12: qty 1

## 2021-12-12 NOTE — Progress Notes (Addendum)
   12/12/21 0553  Charting Type  Charting Type Full Reassessment Changes Noted  Focused Reassessment No Changes Cardiac  Cardiac  Cardiac (WDL) X  Pulse Irregular  Heart Sounds S1, S2;S3  Jugular Venous Distention (JVD) No  ECG Monitoring  Cardiac Rhythm Atrial flutter     [facility] paged regarding change in rhythm.   Per order, treat with Lopressor PRN dose.

## 2021-12-12 NOTE — Assessment & Plan Note (Signed)
-   Pain management according to home regimen which include MS Contin, oxycodone and Valium.

## 2021-12-12 NOTE — Progress Notes (Signed)
Progress Note   Patient: Jesse Whitney:[id number] DOB: 10-10-1955 DOA: 12/10/2021     1 DOS: the patient was seen and examined on 12/12/2021   Brief hospital course: Taken from H&P.  Jesse Whitney is a 66 y.o. 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  He has been having nausea and vomiting.  His self cath about 4 times a day and stated that he had similar experience with previous UTI.  No chest pain or palpitations.  No cough or wheezing.   ED Course: When he came to the ER, blood pressure was 115/92 with a heart rate that was up to 162 and otherwise unremarkable vital signs.  Labs revealed sodium 148 with chloride of 112, glucose of 162 and calcium 8.7 with total protein 6.4, lactic acid 2.3 and CBC showed leukocytosis of 19.9 with neutrophilia and significant hemoconcentration.  Influenza antigens and COVID-19 PCR came back negative.  INR is 1.3 and PT 15.8 and UA was strongly positive for UTI.  Urine culture was sent EKG as reviewed by me : EKG showed supraventricular tachycardia with a rate of 159.  Repeat EKG later on revealed of atrial flutter pattern with a rate of 132. Imaging: Portable chest ray showed hyperinflation, cardiomegaly with no acute cardiopulmonary disease.   The patient was given 5 mg of IV Cardizem, and was later started on IV Cardizem drip.  Is also given IV cefepime and 2.25 L bolus of IV lactated Ringer followed by 150 mill per hour.   9/11: Patient did received a dose of digoxin with some improvement in heart rate.  Due to persistent hypotension despite getting more than 3 L of fluid, pressors with Levophed was ordered.  He was also started on midodrine.  Blood pressure improved.  Patient never started on  Levophed. Converted back to sinus rhythm around 8 AM today. Cardiology was consulted.  Echocardiogram ordered by cardiology  Preliminary blood cultures negative in 12 hours, urine cultures pending.  Some improvement in leukocytosis but all cell lines decreased, most likely some dilutional effect. Procalcitonin at 0.23.  Lactic acidosis resolved.  TSH normal  9/12: Hemodynamically stable.  Remained in sinus rhythm.  Labs improving.  Preliminary blood cultures remain negative. Urine cultures pending.  Patient continued to have some shakiness, talked with wife and according to her that he become very shaky if does not take home Valium every 6 hourly.  At home it is listed as every 6 as needed which he was getting it here but never asked.   Assessment and Plan: * Sepsis due to gram-negative UTI [facility]) Patient met sepsis criteria with leukocytosis, tachycardia and tachypnea.  Severe sepsis with lactic acidosis at 3.2.  Tachycardia can be explained with atrial flutter with RVR. Received appropriate IV fluid and was started on midodrine due to persistent hypotension. Procalcitonin at 0.23, preliminary blood cultures negative and urine cultures pending. Patient is high risk for UTI due to history of self-catheterization. -Continue with ceftriaxone -Follow-up urine cultures  Atrial flutter with rapid ventricular response ([name]) Patient spontaneously converted back to sinus rhythm this morning.  Received Cardizem infusion and a dose of digoxin. Cardiology is on board -Cardiology planning to start him on p.o. Cardizem before discontinuing Cardizem infusion. -CHA2DS2-VASc score of 2, history of prior DVT. Patient was on 2.5  mg twice daily Eliquis which was increased to 5 mg twice daily.  Atonic neurogenic bladder - He will have a Foley catheter while he is here. - We will continue his oxybutynin.  Autonomic dysreflexia - Pain management according to home regimen which include MS Contin, oxycodone  and Valium.  Peripheral neuropathy - We will continue his Neurontin.  BPH (benign prostatic hyperplasia) - We will continue his Flomax.  Pressure injury of skin Pressure Injury 12/11/21 Buttocks Right;Lower Stage 2 -  Partial thickness loss of dermis presenting as a shallow open injury with a red, pink wound bed without slough. (Active)  12/11/21 0610  Location: Buttocks  Location Orientation: Right;Lower  Staging: Stage 2 -  Partial thickness loss of dermis presenting as a shallow open injury with a red, pink wound bed without slough.  Wound Description (Comments):   Present on Admission: Yes        Subjective: Patient was experiencing some shakiness when seen today.  Denies any pain.  He was alert and oriented and able to communicate well.  Apparently not getting his home dose of Valium as he did not know that he has to ask for as needed meds.  Physical Exam: Vitals:   12/12/21 0916 12/12/21 1102 12/12/21 1125 12/12/21 1400  BP: (!) 152/87 (!) 136/103 (!) 158/96   Pulse: (!) 53 (!) 54 (!) 53 85  Resp: _0 Temp: 98.5 F (36.9 C) (!) 97.5 F (36.4 C) 98.7 F (37.1 C)   TempSrc:  Oral Oral   SpO2: 95% 95% 96%   Weight:      Height:       General.  Paraplegic gentleman, in no acute distress.  Appears to have some tremors. Pulmonary.  Lungs clear bilaterally, normal respiratory effort. CV.  Regular rate and rhythm, no JVD, rub or murmur. Abdomen.  Soft, nontender, nondistended, BS positive. CNS.  Alert and oriented .  Extremities.  No edema, no cyanosis, pulses intact and symmetrical. Psychiatry.  Judgment and insight appears normal.  Data Reviewed: Prior data reviewed  Family Communication: Discussed with wife on phone  Disposition: Status is: Inpatient Remains inpatient appropriate because: Awaiting final culture results   Planned Discharge Destination: Home  DVT prophylaxis.  Eliquis Time spent: 43 minutes  This record has been created using Actor. Errors have been sought and corrected,but may not always be located. Such creation errors do not reflect on the standard of care.  Author: Lorella Nimrod, MD 12/12/2021 3:39 PM  For on call review [email or web address].

## 2021-12-12 NOTE — Assessment & Plan Note (Signed)
-   He will have a Foley catheter while he is here. - We will continue his oxybutynin. 
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
## 2021-12-13 DIAGNOSIS — A4150 Gram-negative sepsis, unspecified: Secondary | ICD-10-CM | POA: Diagnosis not present

## 2021-12-13 DIAGNOSIS — N390 Urinary tract infection, site not specified: Secondary | ICD-10-CM | POA: Diagnosis not present

## 2021-12-13 LAB — BASIC METABOLIC PANEL
Anion gap: 4 — ABNORMAL LOW (ref 5–15)
BUN: 11 mg/dL (ref 8–23)
CO2: 27 mmol/L (ref 22–32)
Calcium: 7.6 mg/dL — ABNORMAL LOW (ref 8.9–10.3)
Chloride: 111 mmol/L (ref 98–111)
Creatinine, Ser: 0.6 mg/dL — ABNORMAL LOW (ref 0.61–1.24)
GFR, Estimated: >60 mL/min (ref >60)
Glucose, Bld: 90 mg/dL (ref 70–99)
Potassium: 3.7 mmol/L (ref 3.5–5.1)
Sodium: 142 mmol/L (ref 135–145)

## 2021-12-13 LAB — URINE CULTURE: Culture: 30000 — AB

## 2021-12-13 LAB — CBC
HCT: 42.6 % (ref 39.0–52.0)
Hemoglobin: 14.1 g/dL (ref 13.0–17.0)
MCH: 33.7 pg (ref 26.0–34.0)
MCHC: 33.1 g/dL (ref 30.0–36.0)
MCV: 101.7 fL — ABNORMAL HIGH (ref 80.0–100.0)
Platelets: 177 10*3/uL (ref 150–400)
RBC: 4.19 MIL/uL — ABNORMAL LOW (ref 4.22–5.81)
RDW: 13.8 % (ref 11.5–15.5)
WBC: 8.6 10*3/uL (ref 4.0–10.5)
nRBC: 0 % (ref 0.0–0.2)

## 2021-12-13 LAB — MRSA NEXT GEN BY PCR, NASAL: MRSA by PCR Next Gen: DETECTED — AB

## 2021-12-13 MED ORDER — METOPROLOL TARTRATE 25 MG PO TABS: 12.5000 mg | ORAL_TABLET | Freq: Two times a day (BID) | ORAL | 0 refills | Status: DC

## 2021-12-13 MED ORDER — AMOXICILLIN 500 MG PO CAPS
500.0000 mg | ORAL_CAPSULE | Freq: Three times a day (TID) | ORAL | Status: DC
  Administered 2021-12-13: 500 mg via ORAL
  Filled 2021-12-13: qty 1

## 2021-12-13 MED ORDER — APIXABAN 5 MG PO TABS: 5.0000 mg | ORAL_TABLET | Freq: Two times a day (BID) | ORAL | 1 refills | Status: DC

## 2021-12-13 MED ORDER — AMOXICILLIN 500 MG PO CAPS: 500.0000 mg | ORAL_CAPSULE | Freq: Three times a day (TID) | ORAL | 0 refills | Status: AC

## 2021-12-13 MED ORDER — DILTIAZEM HCL ER COATED BEADS 180 MG PO CP24: 180.0000 mg | ORAL_CAPSULE | Freq: Every day | ORAL | 1 refills | Status: DC

## 2021-12-13 NOTE — Progress Notes (Signed)
PT AVS reviewed and pt verbalized understanding of all teaching and dc instructions. PT is going home with spouse as transportation. Pt has all pt belongings in his possession.

## 2021-12-13 NOTE — Discharge Summary (Signed)
Physician Discharge Summary  WYLAN GENTZLER QIH:[id number] DOB: Aug 03, 1955 DOA: 12/10/2021  PCP: [name], PA-C  Admit date: 12/10/2021 Discharge date: 12/13/2021  Admitted From: Home Disposition:  Home  Discharge Condition:Stable CODE STATUS:DNR Diet recommendation: Heart Healthy  Brief/Interim Summary:  Patient is a 66 y.o. 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  He was having nausea and vomiting.  His self cath about 4 times a day and stated that he had similar experience with previous UTI.  On presentation he was in A-fib with RVR, normotensive.  Lab work showed leukocytosis.  UA was suspicious for UTI.  Patient was given IV Cardizem for A-fib with RVR, started on antibiotics for suspected UTI.  Cardiology consulted, given digoxin.  His urine culture showed Enterococcus faecalis, started on amoxicillin.  Currently his heart rate is well controlled on Cardizem, metoprolol.  Cardiology cleared him for discharge with outpatient follow-up.  Medically stable for discharge to home today.  Following problems were addressed during his hospitalization:  Sepsis due to gram-negative UTI [facility]) Patient met sepsis criteria with leukocytosis, tachycardia and tachypnea.  Severe sepsis with lactic acidosis at 3.2.  Tachycardia can be explained with atrial flutter with RVR. Received appropriate IV fluid and was started on midodrine due to persistent hypotension. Sepsis physiology has resolved.  Blood cultures have been negative so far.  Urine culture showed Enterococcus faecalis, antibiotics changed to amoxicillin   atrial flutter with rapid ventricular response ([place]) Patient now converted back to sinus rhythm .  Received Cardizem infusion and a dose of  digoxin. Cardiology was following.  Started on metoprolol and Cardizem.  Dose of Eliquis increased to 5 mg twice daily -Echo showed preserved ejection fraction, grade 2 diastolic fdysunction.  He will follow-up with cardiology as an outpatient   Atonic neurogenic bladder - He has been doing intermittent self cath at home for years   Autonomic dysreflexia - Pain management according to home regimen which include MS Contin, oxycodone and Valium.   Peripheral neuropathy - We will continue his Neurontin.    Discharge Diagnoses:  Principal Problem:   Sepsis due to gram-negative UTI [facility]) Active Problems:   Atrial flutter with rapid ventricular response (HCC)   Atonic neurogenic bladder   Autonomic dysreflexia   Peripheral neuropathy   BPH (benign prostatic hyperplasia)   Pressure injury of skin    Discharge Instructions  Discharge Instructions     Diet - low sodium heart healthy   Complete by: As directed    Discharge instructions   Complete by: As directed    1)Please take prescribed medications as instructed 2)Follow up with cardiology in 1 to 2 weeks.  Name and number of the provider has been attached 3)Follow up with your PCP   Increase activity slowly   Complete by: As directed    No wound care   Complete by: As directed       Allergies as of 12/13/2021       Reactions   Contrast Media [iodinated Contrast Media] Nausea And Vomiting   Iohexol Nausea And Vomiting        Medication List     TAKE these medications    allopurinol 100 MG tablet Commonly known as: ZYLOPRIM Take 100 mg by mouth daily.   amoxicillin 500 MG capsule Commonly known as: AMOXIL  Take 1 capsule (500 mg total) by mouth every 8 (eight) hours for 5 days.   apixaban 5 MG Tabs tablet Commonly known as: ELIQUIS Take 1 tablet (5 mg total) by mouth 2 (two) times daily. What changed:  medication strength how much to take   diazepam 5 MG tablet Commonly known as: VALIUM Take 5 mg by  mouth every 6 (six) hours as needed for anxiety.   diltiazem 180 MG 24 hr capsule Commonly known as: CARDIZEM CD Take 1 capsule (180 mg total) by mouth daily. Start taking on: December 14, 2021   metoprolol tartrate 25 MG tablet Commonly known as: LOPRESSOR Take 0.5 tablets (12.5 mg total) by mouth 2 (two) times daily.   morphine 60 MG 12 hr tablet Commonly known as: MS CONTIN TAKE 2 TABLETS BY MOUTH TWICE A DAY FOR PAIN   oxyCODONE 15 MG immediate release tablet Commonly known as: ROXICODONE TAKE 1 TABLET BY MOUTH UP TO 3 TIMES DAILY FOR BREAKTHROUGH PAIN        Follow-up Information     Paraschos, Alexander, MD. Go in 2 week(s).   Specialty: Cardiology Contact information: [facility] [place] [address] [phone]         [name], PA-C. Schedule an appointment as soon as possible for a visit in 1 week(s).   Specialty: Physician Assistant Contact information: [place] [address] [phone]                Allergies  Allergen Reactions   Contrast Media [Iodinated Contrast Media] Nausea And Vomiting   Iohexol Nausea And Vomiting    Consultations: Cardiology   Procedures/Studies: ECHOCARDIOGRAM COMPLETE  Result Date: 12/11/2021    ECHOCARDIOGRAM REPORT   Patient Name:   Jesse Whitney Date of Exam: 12/11/2021 Medical Rec #:  [id number]         Height:       68.0 in Accession #:    [id number]        Weight:       161.8 lb Date of Birth:  June 06, 1955         BSA:          1.868 m Patient Age:    62 years          BP:           108/52 mmHg Patient Gender: M                 HR:           72 bpm. Exam Location:  [facility] Procedure: 2D Echo, Cardiac Doppler and Color Doppler Indications:     I48.92 Atrial flutter  History:         Patient has no prior history of Echocardiogram examinations.                  Iliofemoral vein stent; Risk Factors:Hypertension.  Sonographer:     Rosalia Hammers  Referring Phys:  [id number] [place] TANG Diagnosing Phys: Isaias Cowman MD  Sonographer Comments: Suboptimal parasternal window. Image acquisition challenging due to respiratory motion. IMPRESSIONS  1. Left ventricular ejection fraction, by estimation, is 60 to 65%. The left ventricle has normal function. The left ventricle has no regional wall motion abnormalities. Left ventricular diastolic parameters are consistent with Grade II diastolic dysfunction (pseudonormalization).  2. Right ventricular systolic function is normal. The right ventricular size is normal.  3. The mitral valve is normal in structure.  Mild mitral valve regurgitation. No evidence of mitral stenosis.  4. The aortic valve is normal in structure. Aortic valve regurgitation is not visualized. No aortic stenosis is present.  5. There is mild dilatation of the aortic root, measuring 42 mm.  6. The inferior vena cava is normal in size with greater than 50% respiratory variability, suggesting right atrial pressure of 3 mmHg. FINDINGS  Left Ventricle: Left ventricular ejection fraction, by estimation, is 60 to 65%. The left ventricle has normal function. The left ventricle has no regional wall motion abnormalities. The left ventricular internal cavity size was normal in size. There is  no left ventricular hypertrophy. Left ventricular diastolic parameters are consistent with Grade II diastolic dysfunction (pseudonormalization). Right Ventricle: The right ventricular size is normal. No increase in right ventricular wall thickness. Right ventricular systolic function is normal. Left Atrium: Left atrial size was normal in size. Right Atrium: Right atrial size was normal in size. Pericardium: There is no evidence of pericardial effusion. Mitral Valve: The mitral valve is normal in structure. Mild mitral valve regurgitation. No evidence of mitral valve stenosis. Tricuspid Valve: The tricuspid valve is normal in structure. Tricuspid valve  regurgitation is mild . No evidence of tricuspid stenosis. Aortic Valve: The aortic valve is normal in structure. Aortic valve regurgitation is not visualized. No aortic stenosis is present. Aortic valve mean gradient measures 3.0 mmHg. Aortic valve peak gradient measures 5.7 mmHg. Aortic valve area, by VTI measures 3.03 cm. Pulmonic Valve: The pulmonic valve was normal in structure. Pulmonic valve regurgitation is not visualized. No evidence of pulmonic stenosis. Aorta: The aortic root is normal in size and structure. There is mild dilatation of the aortic root, measuring 42 mm. Venous: The inferior vena cava is normal in size with greater than 50% respiratory variability, suggesting right atrial pressure of 3 mmHg. IAS/Shunts: No atrial level shunt detected by color flow Doppler.  LEFT VENTRICLE PLAX 2D LVIDd:         4.00 cm   Diastology LVIDs:         2.91 cm   LV e' medial:    8.49 cm/s LV PW:         1.33 cm   LV E/e' medial:  9.9 LV IVS:        1.70 cm   LV e' lateral:   10.70 cm/s LVOT diam:     2.10 cm   LV E/e' lateral: 7.9 LV SV:         65 LV SV Index:   35 LVOT Area:     3.46 cm  RIGHT VENTRICLE RV Basal diam:  3.10 cm RV S prime:     12.90 cm/s TAPSE (M-mode): 2.8 cm LEFT ATRIUM             Index        RIGHT ATRIUM           Index LA diam:        3.80 cm 2.03 cm/m   RA Area:     18.10 cm LA Vol (A2C):   72.2 ml 38.66 ml/m  RA Volume:   49.80 ml  26.66 ml/m LA Vol (A4C):   40.4 ml 21.63 ml/m LA Biplane Vol: 54.7 ml 29.29 ml/m  AORTIC VALVE AV Area (Vmax):    2.81 cm AV Area (Vmean):   2.82 cm AV Area (VTI):     3.03 cm AV Vmax:           119.00 cm/s AV  Vmean:          88.000 cm/s AV VTI:            0.216 m AV Peak Grad:      5.7 mmHg AV Mean Grad:      3.0 mmHg LVOT Vmax:         96.60 cm/s LVOT Vmean:        71.700 cm/s LVOT VTI:          0.189 m LVOT/AV VTI ratio: 0.88  AORTA Ao Root diam: 4.20 cm MITRAL VALVE MV Area (PHT): 5.31 cm    SHUNTS MV Decel Time: 143 msec    Systemic VTI:  0.19  m MV E velocity: 84.40 cm/s  Systemic Diam: 2.10 cm MV A velocity: 47.60 cm/s MV E/A ratio:  1.77 Isaias Cowman MD Electronically signed by Isaias Cowman MD Signature Date/Time: 12/11/2021/5:04:41 PM    Final    DG Chest Port 1 View  Result Date: 12/10/2021 CLINICAL DATA:  Questionable sepsis - evaluate for abnormality EXAM: PORTABLE CHEST 1 VIEW COMPARISON:  None Available. FINDINGS: Cardiomegaly. No confluent airspace opacities or effusions. Mild hyperinflation. No acute bony abnormality. IMPRESSION: Cardiomegaly.  Hyperinflation.  No active disease. Electronically Signed   By: Rolm Baptise M.D.   On: 12/10/2021 23:02      Subjective: Patient seen and examined at bedside today.  Hemodynamically stable.  Heart rate well controlled.  On normal sinus rhythm.  Denies any complaints, very eager to go home.  Discharge Exam: Vitals:   12/13/21 0415 12/13/21 0800  BP: 111/76 (!) 143/112  Pulse: 81 100  Resp: 20 16  Temp: 98.5 F (36.9 C) 98.4 F (36.9 C)  SpO2: 95% 95%   Vitals:   12/12/21 2124 12/12/21 2343 12/13/21 0415 12/13/21 0800  BP: (!) 131/104 (!) 129/93 111/76 (!) 143/112  Pulse: 90 82 81 100  Resp:  20 20 16   Temp: 98.8 F (37.1 C) 98.3 F (36.8 C) 98.5 F (36.9 C) 98.4 F (36.9 C)  TempSrc: Oral Oral Oral Oral  SpO2: 97% 95% 95% 95%  Weight:      Height:        General: Pt is alert, awake, not in acute distress Cardiovascular: RRR, S1/S2 +, no rubs, no gallops Respiratory: CTA bilaterally, no wheezing, no rhonchi Abdominal: Soft, NT, ND, bowel sounds + Extremities: no edema, no cyanosis    The results of significant diagnostics from this hospitalization (including imaging, microbiology, ancillary and laboratory) are listed below for reference.     Microbiology: Recent Results (from the past 240 hour(s))  Blood Culture (routine x 2)     Status: None (Preliminary result)   Collection Time: 12/10/21 10:56 PM   Specimen: BLOOD  Result Value Ref Range  Status   Specimen Description BLOOD BLOOD LEFT ARM  Final   Special Requests   Final    BOTTLES DRAWN AEROBIC AND ANAEROBIC Blood Culture adequate volume   Culture   Final    NO GROWTH 3 DAYS Performed at [facility], [address] [place], [place] [address]    Report Status PENDING  Incomplete  Blood Culture (routine x 2)     Status: None (Preliminary result)   Collection Time: 12/10/21 10:56 PM   Specimen: BLOOD  Result Value Ref Range Status   Specimen Description BLOOD BLOOD LEFT ARM  Final   Special Requests   Final    BOTTLES DRAWN AEROBIC AND ANAEROBIC Blood Culture results may not be optimal  due to an inadequate volume of blood received in culture bottles   Culture   Final    NO GROWTH 3 DAYS Performed at [facility], [address] [place], [place] [address]    Report Status PENDING  Incomplete  Urine Culture     Status: Abnormal   Collection Time: 12/10/21 10:56 PM   Specimen: Urine, Random  Result Value Ref Range Status   Specimen Description   Final    URINE, RANDOM Performed at [facility], [address] [place], [place] [address]    Special Requests   Final    NONE Performed at [facility], [place], [place] [address]    Culture 30,000 COLONIES/mL ENTEROCOCCUS FAECALIS (A)  Final   Report Status 12/13/2021 FINAL  Final   Organism ID, Bacteria ENTEROCOCCUS FAECALIS (A)  Final      Susceptibility   Enterococcus faecalis - MIC*    AMPICILLIN <=2 SENSITIVE Sensitive     NITROFURANTOIN <=16 SENSITIVE Sensitive     VANCOMYCIN 1 SENSITIVE Sensitive     * 30,000 COLONIES/mL ENTEROCOCCUS FAECALIS  Resp Panel by RT-PCR (Flu A&B, Covid) In/Out Cath Urine     Status: None   Collection Time: 12/10/21 11:19 PM   Specimen: In/Out Cath Urine; Nasal Swab  Result Value Ref Range Status   SARS Coronavirus 2 by RT PCR NEGATIVE NEGATIVE Final    Comment: (NOTE) SARS-CoV-2 target nucleic acids are NOT  DETECTED.  The SARS-CoV-2 RNA is generally detectable in upper respiratory specimens during the acute phase of infection. The lowest concentration of SARS-CoV-2 viral copies this assay can detect is 138 copies/mL. A negative result does not preclude SARS-Cov-2 infection and should not be used as the sole basis for treatment or other patient management decisions. A negative result may occur with  improper specimen collection/handling, submission of specimen other than nasopharyngeal swab, presence of viral mutation(s) within the areas targeted by this assay, and inadequate number of viral copies(<138 copies/mL). A negative result must be combined with clinical observations, patient history, and epidemiological information. The expected result is Negative.  Fact Sheet for Patients:  EntrepreneurPulse.com.au  Fact Sheet for Healthcare Providers:  IncredibleEmployment.be  This test is no t yet approved or cleared by the [place] FDA and  has been authorized for detection and/or diagnosis of SARS-CoV-2 by FDA under an Emergency Use Authorization (EUA). This EUA will remain  in effect (meaning this test can be used) for the duration of the COVID-19 declaration under Section 564(b)(1) of the Act, 21 U.S.C.section 360bbb-3(b)(1), unless the authorization is terminated  or revoked sooner.       Influenza A by PCR NEGATIVE NEGATIVE Final   Influenza B by PCR NEGATIVE NEGATIVE Final    Comment: (NOTE) The Xpert Xpress SARS-CoV-2/FLU/RSV plus assay is intended as an aid in the diagnosis of influenza from Nasopharyngeal swab specimens and should not be used as a sole basis for treatment. Nasal washings and aspirates are unacceptable for Xpert Xpress SARS-CoV-2/FLU/RSV testing.  Fact Sheet for Patients: EntrepreneurPulse.com.au  Fact Sheet for Healthcare Providers: IncredibleEmployment.be  This test is not yet  approved or cleared by the [place] FDA and has been authorized for detection and/or diagnosis of SARS-CoV-2 by FDA under an Emergency Use Authorization (EUA). This EUA will remain in effect (meaning this test can be used) for the duration of the COVID-19 declaration under Section 564(b)(1) of the Act, 21 U.S.C. section 360bbb-3(b)(1), unless the authorization is terminated or  revoked.  Performed at [facility], [address] [place], [place] [address]      Labs: BNP (last 3 results) No results for input(s): "BNP" in the last 8760 hours. Basic Metabolic Panel: Recent Labs  Lab 12/10/21 2256 12/11/21 0632 12/12/21 0752 12/13/21 0511  NA 148* 143 143 142  K 3.6 4.2 3.6 3.7  CL 112* 109 108 111  CO2 25 25 27 27   GLUCOSE 162* 117* 91 90  BUN 15 14 12 11   CREATININE 0.98 0.78 0.66 0.60*  CALCIUM 8.7* 8.3* 7.9* 7.6*  MG  --  1.8  --   --    Liver Function Tests: Recent Labs  Lab 12/10/21 2256  AST 33  ALT 26  ALKPHOS 54  BILITOT 0.9  PROT 6.4*  ALBUMIN 3.5   No results for input(s): "LIPASE", "AMYLASE" in the last 168 hours. No results for input(s): "AMMONIA" in the last 168 hours. CBC: Recent Labs  Lab 12/10/21 2256 12/11/21 0632 12/12/21 0752 12/13/21 0511  WBC 19.9* 15.3* 11.0* 8.6  NEUTROABS 14.9*  --   --   --   HGB 19.0* 17.1* 14.4 14.1  HCT 58.8* 50.8 43.6 42.6  MCV 100.9* 100.0 101.4* 101.7*  PLT 225 199 178 177   Cardiac Enzymes: No results for input(s): "CKTOTAL", "CKMB", "CKMBINDEX", "TROPONINI" in the last 168 hours. BNP: Invalid input(s): "POCBNP" CBG: Recent Labs  Lab 12/11/21 0542 12/12/21 1109  GLUCAP 132* 83   D-Dimer No results for input(s): "DDIMER" in the last 72 hours. Hgb A1c No results for input(s): "HGBA1C" in the last 72 hours. Lipid Profile No results for input(s): "CHOL", "HDL", "LDLCALC", "TRIG", "CHOLHDL", "LDLDIRECT" in the last 72 hours. Thyroid function studies Recent Labs    12/11/21 0632   TSH 1.855   Anemia work up No results for input(s): "VITAMINB12", "FOLATE", "FERRITIN", "TIBC", "IRON", "RETICCTPCT" in the last 72 hours. Urinalysis    Component Value Date/Time   COLORURINE AMBER (A) 12/10/2021 2256   APPEARANCEUR CLOUDY (A) 12/10/2021 2256   APPEARANCEUR Cloudy (A) 01/30/2021 1004   LABSPEC 1.014 12/10/2021 2256   PHURINE 6.0 12/10/2021 2256   GLUCOSEU 50 (A) 12/10/2021 2256   HGBUR LARGE (A) 12/10/2021 2256   BILIRUBINUR NEGATIVE 12/10/2021 2256   BILIRUBINUR Negative 01/30/2021 1004   KETONESUR NEGATIVE 12/10/2021 2256   PROTEINUR 100 (A) 12/10/2021 2256   NITRITE NEGATIVE 12/10/2021 2256   LEUKOCYTESUR NEGATIVE 12/10/2021 2256   Sepsis Labs Recent Labs  Lab 12/10/21 2256 12/11/21 0632 12/12/21 0752 12/13/21 0511  WBC 19.9* 15.3* 11.0* 8.6   Microbiology Recent Results (from the past 240 hour(s))  Blood Culture (routine x 2)     Status: None (Preliminary result)   Collection Time: 12/10/21 10:56 PM   Specimen: BLOOD  Result Value Ref Range Status   Specimen Description BLOOD BLOOD LEFT ARM  Final   Special Requests   Final    BOTTLES DRAWN AEROBIC AND ANAEROBIC Blood Culture adequate volume   Culture   Final    NO GROWTH 3 DAYS Performed at [facility], [address], [place], [place] [address]    Report Status PENDING  Incomplete  Blood Culture (routine x 2)     Status: None (Preliminary result)   Collection Time: 12/10/21 10:56 PM   Specimen: BLOOD  Result Value Ref Range Status   Specimen Description BLOOD BLOOD LEFT ARM  Final   Special Requests   Final    BOTTLES DRAWN AEROBIC AND ANAEROBIC Blood Culture  results may not be optimal due to an inadequate volume of blood received in culture bottles   Culture   Final    NO GROWTH 3 DAYS Performed at [facility], [address] [place], [place] [address]    Report Status PENDING  Incomplete  Urine Culture     Status: Abnormal   Collection Time: 12/10/21 10:56 PM    Specimen: Urine, Random  Result Value Ref Range Status   Specimen Description   Final    URINE, RANDOM Performed at [facility], [address], [place], [place] [address]    Special Requests   Final    NONE Performed at [facility], [place], [place] [address]    Culture 30,000 COLONIES/mL ENTEROCOCCUS FAECALIS (A)  Final   Report Status 12/13/2021 FINAL  Final   Organism ID, Bacteria ENTEROCOCCUS FAECALIS (A)  Final      Susceptibility   Enterococcus faecalis - MIC*    AMPICILLIN <=2 SENSITIVE Sensitive     NITROFURANTOIN <=16 SENSITIVE Sensitive     VANCOMYCIN 1 SENSITIVE Sensitive     * 30,000 COLONIES/mL ENTEROCOCCUS FAECALIS  Resp Panel by RT-PCR (Flu A&B, Covid) In/Out Cath Urine     Status: None   Collection Time: 12/10/21 11:19 PM   Specimen: In/Out Cath Urine; Nasal Swab  Result Value Ref Range Status   SARS Coronavirus 2 by RT PCR NEGATIVE NEGATIVE Final    Comment: (NOTE) SARS-CoV-2 target nucleic acids are NOT DETECTED.  The SARS-CoV-2 RNA is generally detectable in upper respiratory specimens during the acute phase of infection. The lowest concentration of SARS-CoV-2 viral copies this assay can detect is 138 copies/mL. A negative result does not preclude SARS-Cov-2 infection and should not be used as the sole basis for treatment or other patient management decisions. A negative result may occur with  improper specimen collection/handling, submission of specimen other than nasopharyngeal swab, presence of viral mutation(s) within the areas targeted by this assay, and inadequate number of viral copies(<138 copies/mL). A negative result must be combined with clinical observations, patient history, and epidemiological information. The expected result is Negative.  Fact Sheet for Patients:  EntrepreneurPulse.com.au  Fact Sheet for Healthcare Providers:  IncredibleEmployment.be  This test  is no t yet approved or cleared by the [place] FDA and  has been authorized for detection and/or diagnosis of SARS-CoV-2 by FDA under an Emergency Use Authorization (EUA). This EUA will remain  in effect (meaning this test can be used) for the duration of the COVID-19 declaration under Section 564(b)(1) of the Act, 21 U.S.C.section 360bbb-3(b)(1), unless the authorization is terminated  or revoked sooner.       Influenza A by PCR NEGATIVE NEGATIVE Final   Influenza B by PCR NEGATIVE NEGATIVE Final    Comment: (NOTE) The Xpert Xpress SARS-CoV-2/FLU/RSV plus assay is intended as an aid in the diagnosis of influenza from Nasopharyngeal swab specimens and should not be used as a sole basis for treatment. Nasal washings and aspirates are unacceptable for Xpert Xpress SARS-CoV-2/FLU/RSV testing.  Fact Sheet for Patients: EntrepreneurPulse.com.au  Fact Sheet for Healthcare Providers: IncredibleEmployment.be  This test is not yet approved or cleared by the [place] FDA and has been authorized for detection and/or diagnosis of SARS-CoV-2 by FDA under an Emergency Use Authorization (EUA). This EUA will remain in effect (meaning this test can be used) for the duration of the COVID-19 declaration under Section 564(b)(1) of the Act, 21 U.S.C. section 360bbb-3(b)(1), unless  the authorization is terminated or revoked.  Performed at [facility], [address], [place], [place] [address]     Please note: You were cared for by a hospitalist during your hospital stay. Once you are discharged, your primary care physician will handle any further medical issues. Please note that NO REFILLS for any discharge medications will be authorized once you are discharged, as it is imperative that you return to your primary care physician (or establish a relationship with a primary care physician if you do not have one) for your post hospital discharge  needs so that they can reassess your need for medications and monitor your lab values.    Time coordinating discharge: 40 minutes  SIGNED:   Shelly Coss, MD  [facility] 12/13/2021, 12:04 PM Pager [phone]  If 7PM-7AM, please contact night-coverage [email or web address] Password [email or web address]

## 2021-12-13 NOTE — Progress Notes (Signed)
  Transition of Care [facility]) Screening Note   Patient Details  Name: Jesse Whitney Date of Birth: 1956-02-22   Transition of Care [facility]) CM/SW Contact:    Gildardo Griffes, LCSW Phone Number: 12/13/2021, 12:09 PM  Confirmed with spouse no needs.   Transition of Care Department [facility]) has reviewed patient and no TOC needs have been identified at this time. We will continue to monitor patient advancement through interdisciplinary progression rounds. If new patient transition needs arise, please place a TOC consult.  [place], [place] [phone]
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
## 2021-12-15 LAB — CULTURE, BLOOD (ROUTINE X 2)
Culture: NO GROWTH
Culture: NO GROWTH
Special Requests: ADEQUATE

## 2021-12-18 DIAGNOSIS — N390 Urinary tract infection, site not specified: Secondary | ICD-10-CM | POA: Diagnosis not present

## 2021-12-18 DIAGNOSIS — R001 Bradycardia, unspecified: Secondary | ICD-10-CM | POA: Diagnosis not present

## 2021-12-18 DIAGNOSIS — Z79899 Other long term (current) drug therapy: Secondary | ICD-10-CM | POA: Diagnosis not present

## 2021-12-18 DIAGNOSIS — A4150 Gram-negative sepsis, unspecified: Secondary | ICD-10-CM | POA: Diagnosis not present

## 2021-12-18 DIAGNOSIS — I4892 Unspecified atrial flutter: Secondary | ICD-10-CM | POA: Diagnosis not present

## 2021-12-31 DIAGNOSIS — I10 Essential (primary) hypertension: Secondary | ICD-10-CM | POA: Diagnosis not present

## 2021-12-31 DIAGNOSIS — F32A Depression, unspecified: Secondary | ICD-10-CM | POA: Diagnosis not present

## 2021-12-31 DIAGNOSIS — Z0130 Encounter for examination of blood pressure without abnormal findings: Secondary | ICD-10-CM | POA: Diagnosis not present

## 2021-12-31 DIAGNOSIS — G8254 Quadriplegia, C5-C7 incomplete: Secondary | ICD-10-CM | POA: Diagnosis not present

## 2021-12-31 DIAGNOSIS — N319 Neuromuscular dysfunction of bladder, unspecified: Secondary | ICD-10-CM | POA: Diagnosis not present

## 2021-12-31 DIAGNOSIS — G950 Syringomyelia and syringobulbia: Secondary | ICD-10-CM | POA: Diagnosis not present

## 2021-12-31 DIAGNOSIS — F419 Anxiety disorder, unspecified: Secondary | ICD-10-CM | POA: Diagnosis not present

## 2021-12-31 DIAGNOSIS — G904 Autonomic dysreflexia: Secondary | ICD-10-CM | POA: Diagnosis not present

## 2021-12-31 DIAGNOSIS — Z6824 Body mass index (BMI) 24.0-24.9, adult: Secondary | ICD-10-CM | POA: Diagnosis not present

## 2021-12-31 DIAGNOSIS — F1120 Opioid dependence, uncomplicated: Secondary | ICD-10-CM | POA: Diagnosis not present

## 2021-12-31 DIAGNOSIS — Z79899 Other long term (current) drug therapy: Secondary | ICD-10-CM | POA: Diagnosis not present

## 2021-12-31 DIAGNOSIS — G894 Chronic pain syndrome: Secondary | ICD-10-CM | POA: Diagnosis not present

## 2022-01-01 ENCOUNTER — Encounter: Payer: Self-pay | Admitting: Physician Assistant

## 2022-01-01 ENCOUNTER — Ambulatory Visit: Payer: PPO | Admitting: Physician Assistant

## 2022-01-01 VITALS — BP 136/83 | HR 56 | Wt 161.0 lb

## 2022-01-01 DIAGNOSIS — N390 Urinary tract infection, site not specified: Secondary | ICD-10-CM | POA: Diagnosis not present

## 2022-01-01 DIAGNOSIS — R350 Frequency of micturition: Secondary | ICD-10-CM | POA: Diagnosis not present

## 2022-01-01 DIAGNOSIS — Z87440 Personal history of urinary (tract) infections: Secondary | ICD-10-CM

## 2022-01-01 DIAGNOSIS — R5381 Other malaise: Secondary | ICD-10-CM | POA: Diagnosis not present

## 2022-01-01 MED ORDER — AMOXICILLIN-POT CLAVULANATE 875-125 MG PO TABS: 1.0000 | ORAL_TABLET | Freq: Two times a day (BID) | ORAL | 0 refills | Status: AC
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
## 2022-01-02 LAB — URINALYSIS, COMPLETE
Bilirubin, UA: NEGATIVE
Glucose, UA: NEGATIVE
Ketones, UA: NEGATIVE
Nitrite, UA: NEGATIVE
Protein,UA: NEGATIVE
RBC, UA: NEGATIVE
Specific Gravity, UA: 1.015 (ref 1.005–1.030)
Urobilinogen, Ur: 0.2 mg/dL (ref 0.2–1.0)
pH, UA: 5.5 (ref 5.0–7.5)

## 2022-01-02 LAB — MICROSCOPIC EXAMINATION

## 2022-01-04 DIAGNOSIS — N319 Neuromuscular dysfunction of bladder, unspecified: Secondary | ICD-10-CM | POA: Diagnosis not present

## 2022-01-04 DIAGNOSIS — R339 Retention of urine, unspecified: Secondary | ICD-10-CM | POA: Diagnosis not present

## 2022-01-04 LAB — CULTURE, URINE COMPREHENSIVE

## 2022-01-10 ENCOUNTER — Telehealth: Payer: Self-pay

## 2022-01-10 MED ORDER — CIPROFLOXACIN HCL 500 MG PO TABS: 500.0000 mg | ORAL_TABLET | Freq: Two times a day (BID) | ORAL | 0 refills | Status: AC

## 2022-01-10 NOTE — Telephone Encounter (Signed)
Talked with DR. Stoioff he states cipro 2 bid for 7 days.  Medication sent

## 2022-01-10 NOTE — Telephone Encounter (Signed)
Message left of triage line- 2 12pm  Pt states he was in the hospital last week for 4 days with a really bad infection. Sam rxed medication (augmentin BID x 7) on 01/01/22. He has completed the medication and states it did not work.   He would like Dr. Marland KitchenSteroid" [name] to rx him something else. Pt uses [facility]. He states he is unable to drive to [place] for an appt.    Pls advise. Thanks.   CM

## 2022-01-16 DIAGNOSIS — Z23 Encounter for immunization: Secondary | ICD-10-CM | POA: Diagnosis not present

## 2022-01-16 DIAGNOSIS — M25511 Pain in right shoulder: Secondary | ICD-10-CM | POA: Diagnosis not present

## 2022-01-16 DIAGNOSIS — M25512 Pain in left shoulder: Secondary | ICD-10-CM | POA: Diagnosis not present

## 2022-01-16 DIAGNOSIS — I4892 Unspecified atrial flutter: Secondary | ICD-10-CM | POA: Diagnosis not present

## 2022-01-16 DIAGNOSIS — E349 Endocrine disorder, unspecified: Secondary | ICD-10-CM | POA: Diagnosis not present

## 2022-01-16 DIAGNOSIS — Z86718 Personal history of other venous thrombosis and embolism: Secondary | ICD-10-CM | POA: Diagnosis not present

## 2022-01-16 DIAGNOSIS — G894 Chronic pain syndrome: Secondary | ICD-10-CM | POA: Diagnosis not present

## 2022-01-25 DIAGNOSIS — N319 Neuromuscular dysfunction of bladder, unspecified: Secondary | ICD-10-CM | POA: Diagnosis not present

## 2022-01-25 DIAGNOSIS — R339 Retention of urine, unspecified: Secondary | ICD-10-CM | POA: Diagnosis not present

## 2022-01-26 DIAGNOSIS — N390 Urinary tract infection, site not specified: Secondary | ICD-10-CM | POA: Diagnosis not present

## 2022-01-26 DIAGNOSIS — Z7689 Persons encountering health services in other specified circumstances: Secondary | ICD-10-CM | POA: Diagnosis not present

## 2022-02-08 DIAGNOSIS — G904 Autonomic dysreflexia: Secondary | ICD-10-CM | POA: Diagnosis not present

## 2022-02-08 DIAGNOSIS — Z79899 Other long term (current) drug therapy: Secondary | ICD-10-CM | POA: Diagnosis not present

## 2022-02-08 DIAGNOSIS — R030 Elevated blood-pressure reading, without diagnosis of hypertension: Secondary | ICD-10-CM | POA: Diagnosis not present

## 2022-02-08 DIAGNOSIS — G8254 Quadriplegia, C5-C7 incomplete: Secondary | ICD-10-CM | POA: Diagnosis not present

## 2022-02-08 DIAGNOSIS — Z0130 Encounter for examination of blood pressure without abnormal findings: Secondary | ICD-10-CM | POA: Diagnosis not present

## 2022-02-08 DIAGNOSIS — G950 Syringomyelia and syringobulbia: Secondary | ICD-10-CM | POA: Diagnosis not present

## 2022-02-08 DIAGNOSIS — M109 Gout, unspecified: Secondary | ICD-10-CM | POA: Diagnosis not present

## 2022-02-08 DIAGNOSIS — G894 Chronic pain syndrome: Secondary | ICD-10-CM | POA: Diagnosis not present

## 2022-02-08 DIAGNOSIS — N319 Neuromuscular dysfunction of bladder, unspecified: Secondary | ICD-10-CM | POA: Diagnosis not present

## 2022-02-08 DIAGNOSIS — I10 Essential (primary) hypertension: Secondary | ICD-10-CM | POA: Diagnosis not present

## 2022-02-08 DIAGNOSIS — Z6824 Body mass index (BMI) 24.0-24.9, adult: Secondary | ICD-10-CM | POA: Diagnosis not present

## 2022-02-08 DIAGNOSIS — R5381 Other malaise: Secondary | ICD-10-CM | POA: Diagnosis not present

## 2022-02-10 DIAGNOSIS — Z9181 History of falling: Secondary | ICD-10-CM | POA: Diagnosis not present

## 2022-02-10 DIAGNOSIS — Z131 Encounter for screening for diabetes mellitus: Secondary | ICD-10-CM | POA: Diagnosis not present

## 2022-02-10 DIAGNOSIS — R030 Elevated blood-pressure reading, without diagnosis of hypertension: Secondary | ICD-10-CM | POA: Diagnosis not present

## 2022-02-10 DIAGNOSIS — Z87898 Personal history of other specified conditions: Secondary | ICD-10-CM | POA: Diagnosis not present

## 2022-02-10 DIAGNOSIS — E8809 Other disorders of plasma-protein metabolism, not elsewhere classified: Secondary | ICD-10-CM | POA: Diagnosis not present

## 2022-02-10 DIAGNOSIS — F1120 Opioid dependence, uncomplicated: Secondary | ICD-10-CM | POA: Diagnosis not present

## 2022-02-10 DIAGNOSIS — M79604 Pain in right leg: Secondary | ICD-10-CM | POA: Diagnosis not present

## 2022-02-10 DIAGNOSIS — Z0130 Encounter for examination of blood pressure without abnormal findings: Secondary | ICD-10-CM | POA: Diagnosis not present

## 2022-02-10 DIAGNOSIS — G8220 Paraplegia, unspecified: Secondary | ICD-10-CM | POA: Diagnosis not present

## 2022-02-10 DIAGNOSIS — Z79899 Other long term (current) drug therapy: Secondary | ICD-10-CM | POA: Diagnosis not present

## 2022-02-12 ENCOUNTER — Other Ambulatory Visit (INDEPENDENT_AMBULATORY_CARE_PROVIDER_SITE_OTHER): Payer: Self-pay | Admitting: Vascular Surgery

## 2022-02-12 ENCOUNTER — Ambulatory Visit (INDEPENDENT_AMBULATORY_CARE_PROVIDER_SITE_OTHER): Payer: PPO

## 2022-02-12 ENCOUNTER — Telehealth (INDEPENDENT_AMBULATORY_CARE_PROVIDER_SITE_OTHER): Payer: Self-pay | Admitting: Vascular Surgery

## 2022-02-12 DIAGNOSIS — M7989 Other specified soft tissue disorders: Secondary | ICD-10-CM
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
## 2022-02-13 ENCOUNTER — Other Ambulatory Visit (INDEPENDENT_AMBULATORY_CARE_PROVIDER_SITE_OTHER): Payer: Self-pay

## 2022-02-13 DIAGNOSIS — Z79899 Other long term (current) drug therapy: Secondary | ICD-10-CM | POA: Diagnosis not present

## 2022-02-13 DIAGNOSIS — N390 Urinary tract infection, site not specified: Secondary | ICD-10-CM | POA: Diagnosis not present

## 2022-02-13 MED ORDER — APIXABAN 5 MG PO TABS: 5.0000 mg | ORAL_TABLET | Freq: Two times a day (BID) | ORAL | 1 refills | Status: DC

## 2022-02-15 DIAGNOSIS — G894 Chronic pain syndrome: Secondary | ICD-10-CM | POA: Diagnosis not present

## 2022-02-15 DIAGNOSIS — L03115 Cellulitis of right lower limb: Secondary | ICD-10-CM | POA: Diagnosis not present

## 2022-02-15 DIAGNOSIS — L89159 Pressure ulcer of sacral region, unspecified stage: Secondary | ICD-10-CM | POA: Diagnosis not present

## 2022-02-15 DIAGNOSIS — R6883 Chills (without fever): Secondary | ICD-10-CM | POA: Diagnosis not present

## 2022-02-20 ENCOUNTER — Ambulatory Visit (INDEPENDENT_AMBULATORY_CARE_PROVIDER_SITE_OTHER): Payer: PPO | Admitting: Vascular Surgery

## 2022-02-20 ENCOUNTER — Emergency Department: Payer: PPO

## 2022-02-20 ENCOUNTER — Inpatient Hospital Stay
Admission: EM | Admit: 2022-02-20 | Discharge: 2022-03-07 | DRG: 871 | Disposition: A | Discharge status: Hospice | Payer: PPO | Attending: Internal Medicine | Admitting: Internal Medicine

## 2022-02-20 ENCOUNTER — Encounter (INDEPENDENT_AMBULATORY_CARE_PROVIDER_SITE_OTHER): Payer: Self-pay

## 2022-02-20 DIAGNOSIS — Z66 Do not resuscitate: Secondary | ICD-10-CM | POA: Diagnosis present

## 2022-02-20 DIAGNOSIS — R471 Dysarthria and anarthria: Secondary | ICD-10-CM | POA: Diagnosis present

## 2022-02-20 DIAGNOSIS — G9341 Metabolic encephalopathy: Secondary | ICD-10-CM | POA: Diagnosis not present

## 2022-02-20 DIAGNOSIS — J9601 Acute respiratory failure with hypoxia: Secondary | ICD-10-CM | POA: Diagnosis not present

## 2022-02-20 DIAGNOSIS — I5023 Acute on chronic systolic (congestive) heart failure: Secondary | ICD-10-CM | POA: Insufficient documentation

## 2022-02-20 DIAGNOSIS — R339 Retention of urine, unspecified: Secondary | ICD-10-CM | POA: Diagnosis not present

## 2022-02-20 DIAGNOSIS — S60811A Abrasion of right wrist, initial encounter: Secondary | ICD-10-CM | POA: Diagnosis present

## 2022-02-20 DIAGNOSIS — Z79899 Other long term (current) drug therapy: Secondary | ICD-10-CM

## 2022-02-20 DIAGNOSIS — L89510 Pressure ulcer of right ankle, unstageable: Secondary | ICD-10-CM | POA: Diagnosis not present

## 2022-02-20 DIAGNOSIS — I330 Acute and subacute infective endocarditis: Secondary | ICD-10-CM | POA: Diagnosis not present

## 2022-02-20 DIAGNOSIS — G894 Chronic pain syndrome: Secondary | ICD-10-CM | POA: Diagnosis not present

## 2022-02-20 DIAGNOSIS — S82141A Displaced bicondylar fracture of right tibia, initial encounter for closed fracture: Secondary | ICD-10-CM | POA: Diagnosis not present

## 2022-02-20 DIAGNOSIS — L89890 Pressure ulcer of other site, unstageable: Secondary | ICD-10-CM | POA: Diagnosis not present

## 2022-02-20 DIAGNOSIS — S82201A Unspecified fracture of shaft of right tibia, initial encounter for closed fracture: Secondary | ICD-10-CM | POA: Diagnosis not present

## 2022-02-20 DIAGNOSIS — A4901 Methicillin susceptible Staphylococcus aureus infection, unspecified site: Secondary | ICD-10-CM

## 2022-02-20 DIAGNOSIS — N319 Neuromuscular dysfunction of bladder, unspecified: Secondary | ICD-10-CM | POA: Diagnosis not present

## 2022-02-20 DIAGNOSIS — S14109D Unspecified injury at unspecified level of cervical spinal cord, subsequent encounter: Secondary | ICD-10-CM | POA: Diagnosis not present

## 2022-02-20 DIAGNOSIS — J9602 Acute respiratory failure with hypercapnia: Secondary | ICD-10-CM | POA: Diagnosis not present

## 2022-02-20 DIAGNOSIS — I4821 Permanent atrial fibrillation: Secondary | ICD-10-CM | POA: Diagnosis present

## 2022-02-20 DIAGNOSIS — I110 Hypertensive heart disease with heart failure: Secondary | ICD-10-CM | POA: Diagnosis not present

## 2022-02-20 DIAGNOSIS — G928 Other toxic encephalopathy: Secondary | ICD-10-CM | POA: Diagnosis not present

## 2022-02-20 DIAGNOSIS — Z515 Encounter for palliative care: Secondary | ICD-10-CM | POA: Diagnosis not present

## 2022-02-20 DIAGNOSIS — Z87891 Personal history of nicotine dependence: Secondary | ICD-10-CM

## 2022-02-20 DIAGNOSIS — T502X5A Adverse effect of carbonic-anhydrase inhibitors, benzothiadiazides and other diuretics, initial encounter: Secondary | ICD-10-CM | POA: Diagnosis not present

## 2022-02-20 DIAGNOSIS — A419 Sepsis, unspecified organism: Secondary | ICD-10-CM | POA: Diagnosis present

## 2022-02-20 DIAGNOSIS — S14109A Unspecified injury at unspecified level of cervical spinal cord, initial encounter: Secondary | ICD-10-CM | POA: Diagnosis present

## 2022-02-20 DIAGNOSIS — S14107S Unspecified injury at C7 level of cervical spinal cord, sequela: Secondary | ICD-10-CM | POA: Diagnosis not present

## 2022-02-20 DIAGNOSIS — S82839A Other fracture of upper and lower end of unspecified fibula, initial encounter for closed fracture: Secondary | ICD-10-CM | POA: Diagnosis not present

## 2022-02-20 DIAGNOSIS — N5089 Other specified disorders of the male genital organs: Secondary | ICD-10-CM | POA: Diagnosis present

## 2022-02-20 DIAGNOSIS — B9561 Methicillin susceptible Staphylococcus aureus infection as the cause of diseases classified elsewhere: Secondary | ICD-10-CM | POA: Diagnosis not present

## 2022-02-20 DIAGNOSIS — R627 Adult failure to thrive: Secondary | ICD-10-CM | POA: Diagnosis present

## 2022-02-20 DIAGNOSIS — M869 Osteomyelitis, unspecified: Secondary | ICD-10-CM | POA: Diagnosis present

## 2022-02-20 DIAGNOSIS — I5033 Acute on chronic diastolic (congestive) heart failure: Secondary | ICD-10-CM | POA: Diagnosis not present

## 2022-02-20 DIAGNOSIS — T797XXA Traumatic subcutaneous emphysema, initial encounter: Secondary | ICD-10-CM | POA: Diagnosis present

## 2022-02-20 DIAGNOSIS — I4891 Unspecified atrial fibrillation: Secondary | ICD-10-CM | POA: Diagnosis not present

## 2022-02-20 DIAGNOSIS — Z91041 Radiographic dye allergy status: Secondary | ICD-10-CM

## 2022-02-20 DIAGNOSIS — A4101 Sepsis due to Methicillin susceptible Staphylococcus aureus: Secondary | ICD-10-CM | POA: Diagnosis not present

## 2022-02-20 DIAGNOSIS — G8929 Other chronic pain: Secondary | ICD-10-CM | POA: Diagnosis not present

## 2022-02-20 DIAGNOSIS — Z86718 Personal history of other venous thrombosis and embolism: Secondary | ICD-10-CM

## 2022-02-20 DIAGNOSIS — W050XXA Fall from non-moving wheelchair, initial encounter: Secondary | ICD-10-CM | POA: Diagnosis present

## 2022-02-20 DIAGNOSIS — R7881 Bacteremia: Secondary | ICD-10-CM

## 2022-02-20 DIAGNOSIS — L89312 Pressure ulcer of right buttock, stage 2: Secondary | ICD-10-CM | POA: Diagnosis not present

## 2022-02-20 DIAGNOSIS — S82141S Displaced bicondylar fracture of right tibia, sequela: Secondary | ICD-10-CM | POA: Diagnosis not present

## 2022-02-20 DIAGNOSIS — Z7901 Long term (current) use of anticoagulants: Secondary | ICD-10-CM

## 2022-02-20 DIAGNOSIS — M7989 Other specified soft tissue disorders: Secondary | ICD-10-CM | POA: Diagnosis not present

## 2022-02-20 DIAGNOSIS — G904 Autonomic dysreflexia: Secondary | ICD-10-CM | POA: Diagnosis present

## 2022-02-20 DIAGNOSIS — R532 Functional quadriplegia: Secondary | ICD-10-CM | POA: Diagnosis not present

## 2022-02-20 DIAGNOSIS — M8619 Other acute osteomyelitis, multiple sites: Secondary | ICD-10-CM | POA: Diagnosis not present

## 2022-02-20 DIAGNOSIS — M8618 Other acute osteomyelitis, other site: Secondary | ICD-10-CM | POA: Diagnosis not present

## 2022-02-20 DIAGNOSIS — R0602 Shortness of breath: Secondary | ICD-10-CM | POA: Diagnosis not present

## 2022-02-20 DIAGNOSIS — E8809 Other disorders of plasma-protein metabolism, not elsewhere classified: Secondary | ICD-10-CM | POA: Diagnosis present

## 2022-02-20 DIAGNOSIS — R918 Other nonspecific abnormal finding of lung field: Secondary | ICD-10-CM | POA: Diagnosis not present

## 2022-02-20 DIAGNOSIS — I5043 Acute on chronic combined systolic (congestive) and diastolic (congestive) heart failure: Secondary | ICD-10-CM | POA: Diagnosis present

## 2022-02-20 DIAGNOSIS — Z6829 Body mass index (BMI) 29.0-29.9, adult: Secondary | ICD-10-CM

## 2022-02-20 DIAGNOSIS — E663 Overweight: Secondary | ICD-10-CM | POA: Diagnosis not present

## 2022-02-20 DIAGNOSIS — E872 Acidosis, unspecified: Secondary | ICD-10-CM | POA: Diagnosis not present

## 2022-02-20 DIAGNOSIS — E876 Hypokalemia: Secondary | ICD-10-CM | POA: Diagnosis not present

## 2022-02-20 DIAGNOSIS — I959 Hypotension, unspecified: Secondary | ICD-10-CM | POA: Diagnosis not present

## 2022-02-20 DIAGNOSIS — Z888 Allergy status to other drugs, medicaments and biological substances status: Secondary | ICD-10-CM

## 2022-02-20 DIAGNOSIS — R6521 Severe sepsis with septic shock: Secondary | ICD-10-CM | POA: Diagnosis present

## 2022-02-20 DIAGNOSIS — K5909 Other constipation: Secondary | ICD-10-CM | POA: Diagnosis not present

## 2022-02-20 DIAGNOSIS — A4189 Other specified sepsis: Secondary | ICD-10-CM | POA: Diagnosis not present

## 2022-02-20 DIAGNOSIS — Z993 Dependence on wheelchair: Secondary | ICD-10-CM

## 2022-02-20 DIAGNOSIS — R0902 Hypoxemia: Secondary | ICD-10-CM | POA: Diagnosis not present

## 2022-02-20 DIAGNOSIS — R0603 Acute respiratory distress: Secondary | ICD-10-CM | POA: Diagnosis not present

## 2022-02-20 DIAGNOSIS — Z7189 Other specified counseling: Secondary | ICD-10-CM | POA: Diagnosis not present

## 2022-02-20 DIAGNOSIS — S82401A Unspecified fracture of shaft of right fibula, initial encounter for closed fracture: Secondary | ICD-10-CM | POA: Diagnosis not present

## 2022-02-20 DIAGNOSIS — J90 Pleural effusion, not elsewhere classified: Secondary | ICD-10-CM | POA: Diagnosis not present

## 2022-02-20 DIAGNOSIS — R29818 Other symptoms and signs involving the nervous system: Secondary | ICD-10-CM | POA: Diagnosis not present

## 2022-02-20 LAB — CBC WITH DIFFERENTIAL/PLATELET
Abs Immature Granulocytes: 0.33 10*3/uL — ABNORMAL HIGH (ref 0.00–0.07)
Basophils Absolute: 0.1 10*3/uL (ref 0.0–0.1)
Basophils Relative: 0 %
Eosinophils Absolute: 0 10*3/uL (ref 0.0–0.5)
Eosinophils Relative: 0 %
HCT: 37.8 % — ABNORMAL LOW (ref 39.0–52.0)
Hemoglobin: 12.3 g/dL — ABNORMAL LOW (ref 13.0–17.0)
Immature Granulocytes: 2 %
Lymphocytes Relative: 3 %
Lymphs Abs: 0.7 10*3/uL (ref 0.7–4.0)
MCH: 32.1 pg (ref 26.0–34.0)
MCHC: 32.5 g/dL (ref 30.0–36.0)
MCV: 98.7 fL (ref 80.0–100.0)
Monocytes Absolute: 1 10*3/uL (ref 0.1–1.0)
Monocytes Relative: 5 %
Neutro Abs: 18.1 10*3/uL — ABNORMAL HIGH (ref 1.7–7.7)
Neutrophils Relative %: 90 %
Platelets: 345 10*3/uL (ref 150–400)
RBC: 3.83 MIL/uL — ABNORMAL LOW (ref 4.22–5.81)
RDW: 14.3 % (ref 11.5–15.5)
WBC: 20.2 10*3/uL — ABNORMAL HIGH (ref 4.0–10.5)
nRBC: 0 % (ref 0.0–0.2)

## 2022-02-20 LAB — COMPREHENSIVE METABOLIC PANEL
ALT: 11 U/L (ref 0–44)
AST: 17 U/L (ref 15–41)
Albumin: 2 g/dL — ABNORMAL LOW (ref 3.5–5.0)
Alkaline Phosphatase: 65 U/L (ref 38–126)
Anion gap: 6 (ref 5–15)
BUN: 21 mg/dL (ref 8–23)
CO2: 31 mmol/L (ref 22–32)
Calcium: 7.7 mg/dL — ABNORMAL LOW (ref 8.9–10.3)
Chloride: 98 mmol/L (ref 98–111)
Creatinine, Ser: 0.88 mg/dL (ref 0.61–1.24)
GFR, Estimated: >60 mL/min (ref >60)
Glucose, Bld: 130 mg/dL — ABNORMAL HIGH (ref 70–99)
Potassium: 3.4 mmol/L — ABNORMAL LOW (ref 3.5–5.1)
Sodium: 135 mmol/L (ref 135–145)
Total Bilirubin: 0.9 mg/dL (ref 0.3–1.2)
Total Protein: 5 g/dL — ABNORMAL LOW (ref 6.5–8.1)

## 2022-02-20 LAB — URINALYSIS, ROUTINE W REFLEX MICROSCOPIC
Bacteria, UA: NONE SEEN
Glucose, UA: NEGATIVE mg/dL
Hgb urine dipstick: NEGATIVE
Ketones, ur: NEGATIVE mg/dL
Nitrite: NEGATIVE
Protein, ur: 30 mg/dL — AB
Specific Gravity, Urine: 1.029 (ref 1.005–1.030)
pH: 5 (ref 5.0–8.0)

## 2022-02-20 LAB — LACTIC ACID, PLASMA
Lactic Acid, Venous: 2.8 mmol/L (ref 0.5–1.9)
Lactic Acid, Venous: 2.7 mmol/L (ref 0.5–1.9)
Lactic Acid, Venous: 1.8 mmol/L (ref 0.5–1.9)

## 2022-02-20 LAB — APTT: aPTT: 45 seconds — ABNORMAL HIGH (ref 24–36)

## 2022-02-20 LAB — HEPARIN LEVEL (UNFRACTIONATED): Heparin Unfractionated: >1.1 IU/mL — ABNORMAL HIGH (ref 0.30–0.70)

## 2022-02-20 LAB — PROTIME-INR
INR: 3.3 — ABNORMAL HIGH (ref 0.8–1.2)
Prothrombin Time: 33.2 seconds — ABNORMAL HIGH (ref 11.4–15.2)

## 2022-02-20 LAB — PHOSPHORUS: Phosphorus: 2.7 mg/dL (ref 2.5–4.6)

## 2022-02-20 LAB — CK: Total CK: 27 U/L — ABNORMAL LOW (ref 49–397)

## 2022-02-20 LAB — MAGNESIUM: Magnesium: 1.5 mg/dL — ABNORMAL LOW (ref 1.7–2.4)

## 2022-02-20 MED ORDER — DIAZEPAM 5 MG PO TABS
5.0000 mg | ORAL_TABLET | Freq: Four times a day (QID) | ORAL | Status: DC
  Administered 2022-02-20 – 2022-03-03 (×38): 5 mg via ORAL
  Filled 2022-02-20 (×39): qty 1

## 2022-02-20 MED ORDER — HEPARIN (PORCINE) 25000 UT/250ML-% IV SOLN
1000.0000 [IU]/h | INTRAVENOUS | Status: DC
  Administered 2022-02-20: 1000 [IU]/h via INTRAVENOUS
  Filled 2022-02-20: qty 250

## 2022-02-20 MED ORDER — SODIUM CHLORIDE 0.9 % IV BOLUS: 1000.0000 mL | Freq: Once | INTRAVENOUS | Status: DC

## 2022-02-20 MED ORDER — MORPHINE SULFATE (PF) 4 MG/ML IV SOLN
4.0000 mg | Freq: Once | INTRAVENOUS | Status: AC
  Administered 2022-02-20: 4 mg via INTRAVENOUS
  Filled 2022-02-20: qty 1

## 2022-02-20 MED ORDER — VANCOMYCIN HCL IN DEXTROSE 1-5 GM/200ML-% IV SOLN: 1000.0000 mg | Freq: Once | INTRAVENOUS | Status: DC

## 2022-02-20 MED ORDER — NOREPINEPHRINE 4 MG/250ML-% IV SOLN
2.0000 ug/min | INTRAVENOUS | Status: DC
  Administered 2022-02-20: 2 ug/min via INTRAVENOUS
  Filled 2022-02-20: qty 250

## 2022-02-20 MED ORDER — VANCOMYCIN HCL 1750 MG/350ML IV SOLN
1750.0000 mg | INTRAVENOUS | Status: DC
  Filled 2022-02-20: qty 350

## 2022-02-20 MED ORDER — LACTATED RINGERS IV BOLUS
1000.0000 mL | Freq: Once | INTRAVENOUS | Status: AC
  Administered 2022-02-20: 1000 mL via INTRAVENOUS

## 2022-02-20 MED ORDER — DOCUSATE SODIUM 100 MG PO CAPS
100.0000 mg | ORAL_CAPSULE | Freq: Two times a day (BID) | ORAL | Status: DC | PRN
  Administered 2022-02-23: 100 mg via ORAL
  Filled 2022-02-20: qty 1

## 2022-02-20 MED ORDER — SODIUM CHLORIDE 0.9 % IV SOLN
2.0000 g | Freq: Once | INTRAVENOUS | Status: AC
  Administered 2022-02-20: 2 g via INTRAVENOUS
  Filled 2022-02-20: qty 12.5

## 2022-02-20 MED ORDER — POLYETHYLENE GLYCOL 3350 17 G PO PACK
17.0000 g | PACK | Freq: Every day | ORAL | Status: DC | PRN
  Administered 2022-02-23: 17 g via ORAL
  Filled 2022-02-20: qty 1

## 2022-02-20 MED ORDER — PANTOPRAZOLE SODIUM 40 MG PO TBEC
40.0000 mg | DELAYED_RELEASE_TABLET | Freq: Every day | ORAL | Status: DC
  Administered 2022-02-20 – 2022-03-07 (×16): 40 mg via ORAL
  Filled 2022-02-20 (×16): qty 1

## 2022-02-20 MED ORDER — SODIUM CHLORIDE 0.9 % IV BOLUS
1000.0000 mL | Freq: Once | INTRAVENOUS | Status: AC
  Administered 2022-02-20: 1000 mL via INTRAVENOUS

## 2022-02-20 MED ORDER — ALBUMIN HUMAN 25 % IV SOLN
25.0000 g | Freq: Once | INTRAVENOUS | Status: AC
  Administered 2022-02-20: 25 g via INTRAVENOUS
  Filled 2022-02-20: qty 100

## 2022-02-20 MED ORDER — VANCOMYCIN HCL 1500 MG/300ML IV SOLN
1500.0000 mg | Freq: Once | INTRAVENOUS | Status: AC
  Administered 2022-02-20: 1500 mg via INTRAVENOUS
  Filled 2022-02-20: qty 300

## 2022-02-20 MED ORDER — SODIUM CHLORIDE 0.9 % IV SOLN
250.0000 mL | INTRAVENOUS | Status: DC
  Administered 2022-03-01: 250 mL via INTRAVENOUS

## 2022-02-20 MED ORDER — OXYCODONE HCL 5 MG PO TABS
10.0000 mg | ORAL_TABLET | ORAL | Status: DC | PRN
  Administered 2022-02-20: 10 mg via ORAL
  Filled 2022-02-20: qty 2

## 2022-02-20 MED ORDER — ALLOPURINOL 100 MG PO TABS
100.0000 mg | ORAL_TABLET | Freq: Every day | ORAL | Status: DC
  Administered 2022-02-21 – 2022-03-07 (×15): 100 mg via ORAL
  Filled 2022-02-20 (×15): qty 1

## 2022-02-20 MED ORDER — MORPHINE SULFATE (PF) 2 MG/ML IV SOLN
2.0000 mg | INTRAVENOUS | Status: DC | PRN
  Administered 2022-02-20: 2 mg via INTRAVENOUS
  Administered 2022-02-21: 4 mg via INTRAVENOUS
  Administered 2022-02-22 – 2022-02-23 (×2): 2 mg via INTRAVENOUS
  Administered 2022-02-24: 4 mg via INTRAVENOUS
  Filled 2022-02-20 (×3): qty 1
  Filled 2022-02-20 (×2): qty 2
  Filled 2022-02-20: qty 1

## 2022-02-20 MED ORDER — METRONIDAZOLE 500 MG/100ML IV SOLN
500.0000 mg | Freq: Once | INTRAVENOUS | Status: AC
  Administered 2022-02-20: 500 mg via INTRAVENOUS
  Filled 2022-02-20: qty 100

## 2022-02-20 MED ORDER — SODIUM CHLORIDE 0.9 % IV SOLN
2.0000 g | Freq: Three times a day (TID) | INTRAVENOUS | Status: DC
  Administered 2022-02-21 (×2): 2 g via INTRAVENOUS
  Filled 2022-02-20 (×2): qty 12.5
  Filled 2022-02-20: qty 2

## 2022-02-20 NOTE — Consult Note (Signed)
PHARMACY -  BRIEF ANTIBIOTIC NOTE   Pharmacy has received consult(s) for cefepime and vancomycin dosing from an ED provider.  The patient's profile has been reviewed for ht/wt/allergies/indication/available labs.    One time order(s) placed for Cefepime 2 grams IV and Vancomycin 1500 mg IV.  Further antibiotics/pharmacy consults should be ordered by admitting physician if indicated.                       Thank you, Barrie Folk 02/20/2022  4:18 PM

## 2022-02-20 NOTE — Progress Notes (Signed)
An USGPIV (ultrasound guided PIV) has been placed for short-term vasopressor infusion. A correctly placed ivWatch must be used when administering Vasopressors. Should this treatment be needed beyond 72 hours, central line access should be obtained.  It will be the responsibility of the bedside nurse to follow best practice to prevent extravasations.   ?

## 2022-02-20 NOTE — ED Notes (Signed)
Pt complained of "pulling", "tearing", "ripping" pain in mid chest/abdomen (new onset), MD McHugh at bedside. Given order to straight cath for possible full bladder. Output roughly 300 mL. MD Derrill Kay notified.
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
## 2022-02-20 NOTE — ED Notes (Signed)
MD notified of repeat low BP. Orders given for pressure bag fluid.

## 2022-02-20 NOTE — ED Provider Notes (Signed)
[facility] Provider Note    Event Date/Time   First MD Initiated Contact with Patient 02/20/22 1530     (approximate)   History   Fever   HPI  Jesse Whitney is a 66 y.o. male  who presents to the emergency department today because of concern for high white blood cell count. Patient had blood work done by his PCP last week and was called and told his white blood cell count was high and that he should go the the emergency department. Family does state that ever since an admission for UTI and sepsis a couple of months ago the patient has not been doing well. He has had increased fatigue. Additionally they are concerning for bedsores. Initially they started a number of months ago, over the past couple of weeks they have gotten worse. Furthermore the patient was being seen for right lower leg swelling, family states they had an appointment with vascular who did not find any clots. Primary care then started treating with antibiotics.   Physical Exam   Triage Vital Signs: ED Triage Vitals [02/20/22 1427]  Enc Vitals Group     BP (!) 137/122     Pulse Rate 100     Resp 17     Temp 98.5 F (36.9 C)     Temp Source Oral     SpO2 95 %     Weight 150 lb (68 kg)     Height      Head Circumference      Peak Flow      Pain Score 0     Pain Loc      Pain Edu?      Excl. in GC?     Most recent vital signs: Vitals:   02/20/22 1427  BP: (!) 137/122  Pulse: 100  Resp: 17  Temp: 98.5 F (36.9 C)  SpO2: 95%   General: Awake, alert, oriented. CV:  Tachycardia, irregular rhythm. Resp:  Normal effort. Lungs clear. Abd:  No distention.  Other:  Swelling to right upper leg. Pressure ulcer to right buttock.    ED Results / Procedures / Treatments   Labs (all labs ordered are listed, but only abnormal results are displayed) Labs Reviewed  LACTIC ACID, PLASMA - Abnormal; Notable for the following components:      Result Value   Lactic Acid, Venous 2.8  (*)    All other components within normal limits  COMPREHENSIVE METABOLIC PANEL - Abnormal; Notable for the following components:   Potassium 3.4 (*)    Glucose, Bld 130 (*)    Calcium 7.7 (*)    Total Protein 5.0 (*)    Albumin 2.0 (*)    All other components within normal limits  CBC WITH DIFFERENTIAL/PLATELET - Abnormal; Notable for the following components:   WBC 20.2 (*)    RBC 3.83 (*)    Hemoglobin 12.3 (*)    HCT 37.8 (*)    Neutro Abs 18.1 (*)    Abs Immature Granulocytes 0.33 (*)    All other components within normal limits  LACTIC ACID, PLASMA  URINALYSIS, ROUTINE W REFLEX MICROSCOPIC     EKG  I, Phineas Semen, attending physician, personally viewed and interpreted this EKG  EKG Time: 1701 Rate: 175 Rhythm: atrial fibrillation with rvr Axis: normal Intervals: qtc 451 QRS: narrow ST changes: no st elevation Impression: abnormal ekg   RADIOLOGY I independently interpreted and visualized the ct right femur. My interpretation: No acute  osseous abnormality, edema and subcutaneous gas to right buttock. Radiology interpretation:  IMPRESSION:  1. Subacute comminuted fracture of the right tibial plateau with  dissociation of the tibial metaphysis and proximal diaphysis  (Schatzker type 6).  2. Subacute comminuted fracture of the fibular head.  3. Asymmetric sclerosis and periosteal reaction involving the right  ischial tuberosity, concerning for osteomyelitis. Small amount of  subcutaneous emphysema in the inferior right buttock tracking  towards the ischial tuberosity. Correlate for decubitus ulcer.  4. Large knee hemarthrosis.  5. Significant diffuse soft tissue swelling of the visualized right  leg. No abscess.      PROCEDURES:  Critical Care performed: Yes, see critical care procedure note(s)  Procedures  CRITICAL CARE Performed by: Phineas Semen   Total critical care time: 30 minutes  Critical care time was exclusive of separately billable  procedures and treating other patients.  Critical care was necessary to treat or prevent imminent or life-threatening deterioration.  Critical care was time spent personally by me on the following activities: development of treatment plan with patient and/or surrogate as well as nursing, discussions with consultants, evaluation of patient's response to treatment, examination of patient, obtaining history from patient or surrogate, ordering and performing treatments and interventions, ordering and review of laboratory studies, ordering and review of radiographic studies, pulse oximetry and re-evaluation of patient's condition.   MEDICATIONS ORDERED IN ED: Medications - No data to display   IMPRESSION / MDM / ASSESSMENT AND PLAN / ED COURSE  I reviewed the triage vital signs and the nursing notes.                              Differential diagnosis includes, but is not limited to, UTI, skin infection.  Patient's presentation is most consistent with acute presentation with potential threat to life or bodily function.  The patient is on the cardiac monitor to evaluate for evidence of arrhythmia and/or significant heart rate changes.  Patient presented to the emergency department today because of concern for high wbc count. Patient also has complaints of bedsores and some leg swelling.  Patient's blood work is concerning for both elevated white blood cell count and lactic acidosis.  Because of this patient was started on multiple broad-spectrum antibiotics.  Additionally I did obtain imaging of his right upper thigh giving swelling.  This did show some subacute fractures of his fibula and tibia.  Discussed with Dr. Martha Clan with orthopedics.  Did not feel these required any acute surgical intervention.  Imaging however also was concerning for osteomyelitis.  Discussed findings with patient and family.  After discussing with the orthopedic surgeon the patient's blood pressure did start to drop. He  was given multiple IV fluid boluses without significant improvement of his blood pressure. Did order levophed to be initiated. Discussed with provider Rust[place]Chester with the ICU team who will plan on admission.     FINAL CLINICAL IMPRESSION(S) / ED DIAGNOSES   Final diagnoses:  Osteomyelitis, unspecified site, unspecified type [facility])     Note:  This document was prepared using Dragon voice recognition software and may include unintentional dictation errors.    Phineas Semen, MD 02/20/22 2216
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
## 2022-02-20 NOTE — H&P (Incomplete)
NAME:  Jesse Whitney, MRN:  [id number], DOB:  [birth year], LOS: 0 ADMISSION DATE:  02/20/2022, CONSULTATION DATE:  02/20/22 REFERRING MD:  Dr. Derrill Kay, CHIEF COMPLAINT:  Fever  History of Present Illness:  66 yo M presenting to [facility] ED from home on 02/20/2022 for evaluation of high white blood cell count at the encouragement of the patient's PCP.   History provided by patient and wife bedside as well as chart review. 2-1/2 weeks ago the patient fell out of his wheelchair causing bruising to the right lower extremity.  Of note the patient is a quadriplegic and is wheelchair-bound.  Around the same time the patient reports poor p.o. intake, increased fatigue, increased right lower extremity swelling and he was evaluated by his PCP.  There was concern for possible cellulitis in the right lower extremity as well as possible infection of a wound on his buttocks.  He was started on Levaquin & Flagyl and lab work was sent off.  The patient was also seen by vascular and ultrasound was performed of the right lower extremity which was negative for DVT.  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 Of note he was admitted in September 2023 septic from a UTI, and during this admission he developed atrial fibrillation with rapid ventricular response which converted to normal sinus rhythm after diltiazem. He has been out of diltiazem for the past week, and was taken off the lopressor immediately after discharge due to low heart rate noted at a follow-up PCP appointment outpatient.   ED course: Upon arrival vital signs stable, however patient quickly became tachypneic, hypotensive with A-fib RVR.  Sepsis protocol initiated, and blood pressure appeared fluid responsive at first.  After 3 L IV fluid bolus however BP remains soft and peripheral vasopressors initiated.  Imaging revealed a subacute commuted fracture of the fibular head and subacute  commuted fracture of the right tibial plateau.  Orthopedics was consulted by EDP, however as patient is wheelchair-bound and quadriplegic no surgical intervention at this time.  Imaging also suggestive of osteomyelitis in pressure injury on right ischial tuberosity.  Lab work significant for mild hypokalemia, lactic acidosis, hypoalbuminemia & leukocytosis. Medications given: 4 mg of morphine, cefepime/ flagyl/ vancomycin, 3 L IVF bolus, levophed drip started Initial Vitals: 98.5, 17, 100 & 137/122 & 95% on RA Significant labs: (Labs/ Imaging personally reviewed) I, Cheryll Cockayne Rust-Chester, AGACNP-BC, personally viewed and interpreted this ECG. EKG Interpretation: Date: 02/20/22, EKG Time: 17:01, Rate: 175, Rhythm: A-fib RVR, QRS Axis:  normal, Intervals: a-fib, ST/T Wave abnormalities: none, Narrative Interpretation: Atrial Fibrillation with RVR Chemistry: Na+: 135, K+: 3.4, BUN/Cr.: 21/0.88, Serum CO2/ AG: 31/6, albumin: 2.0, CK: 27 Hematology: WBC: 20.2, Hgb: 12.3,  Lactic/ PCT: 2.8 > 2.7/ pending  CT femur RIGHT wo contrast 02/20/22:  Subacute comminuted fracture of the right tibial plateau with dissociation of the tibial metaphysis and proximal diaphysis (Schatzker type 6). Subacute comminuted fracture of the fibular head. 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 Small amount of subcutaneous emphysema in the inferior right buttock tracking towards the ischial tuberosity. correlate for decubitus ulcer. Large knee hemarthrosis. Significant diffuse soft tissue swelling of the visualized right leg. No abscess.   PCCM consulted for admission due to sepsis with shock secondary to osteomyelitis from pressure injury on right ischial tuberosity requiring peripheral vasopressor support.  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  Patient also in atrial fibrillation with rapid ventricular response.  Interim History / Subjective:  Patient alert and responsive with wife bedside.  No current complaints at this time, peripheral vasopressor infusing at 3 mcg/min.  Plan of care discussed all questions and concerns answered at this time.  Objective   Blood pressure 102/74, pulse 99, temperature 97.9 F (36.6 C), temperature source Oral, resp. rate 16, weight 68 kg, SpO2 95 %.        Intake/Output Summary (Last 24 hours) at 02/20/2022 2004 Last data filed at 02/20/2022 1836 Gross per 24 hour  Intake --  Output 300 ml  Net -300 ml   Filed Weights   02/20/22 1427  Weight: 68 kg    Examination: General: Adult male, acutely ill, lying in bed, NAD HEENT: MM pink/moist, anicteric, atraumatic, neck supple Neuro: A&O x 4, able to follow commands, PERRL +3, quadriplegic- moderate grip strength BUE, paralysis of BLE  CV: s1s2 irregular, A-fib on monitor, no r/m/g Pulm: Regular, non labored on RA , breath sounds clear-BUL & diminished-BLL GI: soft, rounded, non tender, bs x 4 Skin: pressure injury noted on RIGHT ischial tuberosity, scabbed abrasions on right wrist & ecchymosis and edema noted on right lower extremity   Extremities: warm/dry, pulses + 2 R/P, +2 edema noted BLE  Resolved Hospital Problem list     Assessment & Plan:  Suspected sepsis with septic shock due to osteomyelitis from pressure injury on right ischial tuberosity Initial interventions/workup included: 3 L of NS/LR & Cefepime/ Vancomycin/ Metronidazole - Supplemental oxygen as needed, to maintain SpO2 > 90% - f/u cultures, trend lactic/ PCT - Daily CBC,  monitor WBC/ fever curve - IV antibiotics: cefepime & vancomycin  - Continue vasopressors to maintain MAP< 65: norepinephrine - Strict I/O's: alert provider if UOP < 0.5 mL/kg/hr - consult general surgery to review imaging for potential intervention  Paroxysmal Atrial Fibrillation with Rapid Ventricular Response CHA2DS2-VASc score: 2 - Eliquis transitioned to Heparin drip per pharmacy consult, just in case general surgery considers debridement intervention - will optimize pain control, review lactic to determine further IVF resuscitation > if normalized and a-fib RVR continues consider amiodarone bolus - outpatient diltiazem on hold due to hypotension - Consider Cardiology consult PRN - Continuous cardiac monitoring  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  - fleet enema ordered PRN  - bowel regimen ordered   Pressure injury on right ischial tuberosity-PTA - consult to WOC  - consult to dietary to optimize nutrition -Every 2 turns, utilize offloading devices  Subacute fracture of the right tibial plateau - Orthopedics consulted, no surgical intervention at this time -Outpatient follow-up  Best Practice (right click and "Reselect all SmartList Selections" daily)  Diet/type: NPO w/ oral meds DVT prophylaxis: systemic heparin GI prophylaxis: PPI Lines: N/A Foley:  N/A Code Status:  DNR Last date of  multidisciplinary goals of care discussion [02/20/2022] Discussed with patient and spouse at bedside who both confirmed wishes for DNR/DNI status. Labs   CBC: Recent Labs  Lab 02/20/22 1431  WBC 20.2*  NEUTROABS 18.1*  HGB 12.3*  HCT 37.8*  MCV 98.7  PLT 345    Basic Metabolic Panel: Recent Labs  Lab 02/20/22 1431  NA 135  K 3.4*  CL 98  CO2 31  GLUCOSE 130*  BUN 21  CREATININE 0.88  CALCIUM 7.7*   GFR: Estimated Creatinine Clearance: 80.5 mL/min (by C-G formula based on SCr of 0.88 mg/dL). Recent Labs  Lab 02/20/22 1431 02/20/22 1630  WBC 20.2*  --   LATICACIDVEN 2.8* 2.7*    Liver Function Tests: Recent Labs  Lab 02/20/22 1431  AST 17  ALT 11  ALKPHOS 65  BILITOT 0.9  PROT 5.0*  ALBUMIN 2.0*   No results for input(s): "LIPASE", "AMYLASE" in the last 168 hours. No results for input(s): "AMMONIA" in the last 168 hours.  ABG No results found for: "PHART", "PCO2ART", "PO2ART", "HCO3", "TCO2", "ACIDBASEDEF", "O2SAT"   Coagulation Profile: No results for input(s): "INR", "PROTIME" in the last 168 hours.  Cardiac Enzymes: Recent Labs  Lab 02/20/22 1547  CKTOTAL 27*    HbA1C: No results found for: "HGBA1C"  CBG: No results for input(s): "GLUCAP" in the last 168 hours.  Review of Systems: Positives in bold  Gen: Denies fever, chills, weight change, fatigue, night sweats HEENT: Denies blurred vision, double vision, hearing loss, tinnitus, sinus congestion, rhinorrhea, sore throat, neck stiffness, dysphagia PULM: Denies shortness of breath, cough, sputum production, hemoptysis, wheezing CV: Denies chest pain, edema, orthopnea, paroxysmal nocturnal dyspnea, palpitations GI: Denies abdominal pain, nausea, vomiting, diarrhea, hematochezia, melena, constipation, change in bowel habits GU: Denies dysuria, hematuria, polyuria, oliguria, urethral discharge Endocrine: Denies hot or cold intolerance, polyuria, polyphagia or appetite change Derm: Denies  rash, dry skin, scaling or peeling skin change Heme: Denies easy bruising, bleeding, bleeding gums Neuro: Denies headache, numbness, weakness, slurred speech, loss of memory or consciousness  Past Medical History:  He,  has a past medical history of Autonomic dysreflexia, Hypertension, Quadriplegia following spinal cord injury (1981), and Spinal cord injury at C5-C7 level without injury of spinal bone (HCC).   Surgical History:   Past Surgical History:  Procedure Laterality Date   ANKLE SURGERY     BACK SURGERY     COLONOSCOPY     HEMORRHOID SURGERY  2008   PERIPHERAL VASCULAR THROMBECTOMY Right 11/24/2018   Procedure: PERIPHERAL VASCULAR THROMBECTOMY;  Surgeon: Annice Needyew, Jason S, MD;  Location: [facility] INVASIVE CV LAB;  Service: Cardiovascular;  Laterality: Right;   SPINE SURGERY       Social History:   reports that he has never smoked. He quit smokeless tobacco use about 35 years ago.  His smokeless tobacco use included chew. He reports current alcohol use of about 1.0 standard drink of alcohol per week. He reports that he does not use drugs.   Family History:  His family history includes Breast cancer (age of onset: 9750) in his mother.   Allergies Allergies  Allergen Reactions   Contrast Media [Iodinated Contrast Media] Nausea And Vomiting   Iohexol Nausea And Vomiting     Home Medications  Prior to Admission medications  Medication Sig Start Date End Date Taking? Authorizing Provider  allopurinol (ZYLOPRIM) 100 MG tablet Take 100 mg by mouth daily.    [provider]  apixaban (ELIQUIS) 5 MG TABS tablet Take 1 tablet (5 mg total) by mouth 2 (two) times daily. 02/13/22   Georgiana Spinner, NP  diazepam (VALIUM) 5 MG tablet Take 5 mg by mouth every 6 (six) hours as needed for anxiety.    [provider]  diltiazem (CARDIZEM CD) 180 MG 24 hr capsule Take 1 capsule (180 mg total) by mouth daily. 12/14/21   Burnadette Pop, MD  metoprolol tartrate (LOPRESSOR) 25 MG tablet  Take 0.5 tablets (12.5 mg total) by mouth 2 (two) times daily. 12/13/21   Burnadette Pop, MD  morphine (MS CONTIN) 60 MG 12 hr tablet TAKE 2 TABLETS BY MOUTH TWICE A DAY FOR PAIN 11/03/18   [provider]  oxyCODONE (ROXICODONE) 15 MG immediate release tablet TAKE 1 TABLET BY MOUTH UP TO 3 TIMES DAILY FOR BREAKTHROUGH PAIN 12/04/17   [provider]     Critical care time: 65 minutes       Betsey Holiday, AGACNP-BC Acute Care Nurse Practitioner [facility]   [phone] / [phone] Please see Amion for pager details.

## 2022-02-20 NOTE — ED Triage Notes (Signed)
Pt wife sts that pt has a high WBC as per his PCP. PCP prescribed pt abx however he is not feeling any better. Pt is a quad and has pressure wounds on his buttocks.

## 2022-02-20 NOTE — Progress Notes (Signed)
Pharmacy Antibiotic Note  Jesse Whitney is a 66 y.o. male admitted on 02/20/2022 with  osteomyelitis (Non-vertebral) .  Pharmacy has been consulted for Vancomycin, Cefepime dosing.  Plan: Cefepime 2 gm IV X 1 given on 11/21 @ 1655. Cefepime 2 gm IV Q8H ordered to start on 11/22 @ 0100.  Vancomycin 1500 mg IV X 1 given on 11/21 @ 1659. Vancomycin 1750 mg IV Q24H ordered to start on 11/22 @ 1700.  AUC =  502 Vanc trough = 9.1   Weight: 68 kg (150 lb)  Temp (24hrs), Avg:98.2 F (36.8 C), Min:97.9 F (36.6 C), Max:98.5 F (36.9 C)  Recent Labs  Lab 02/20/22 1431 02/20/22 1630  WBC 20.2*  --   CREATININE 0.88  --   LATICACIDVEN 2.8* 2.7*    Estimated Creatinine Clearance: 80.5 mL/min (by C-G formula based on SCr of 0.88 mg/dL).    Allergies  Allergen Reactions   Contrast Media [Iodinated Contrast Media] Nausea And Vomiting   Iohexol Nausea And Vomiting    Antimicrobials this admission:   >>    >>   Dose adjustments this admission:   Microbiology results:  BCx:   UCx:    Sputum:    MRSA PCR:   Thank you for allowing pharmacy to be a part of this patient's care.  Jessabelle Markiewicz D 02/20/2022 11:01 PM

## 2022-02-21 ENCOUNTER — Encounter: Payer: Self-pay | Admitting: Registered Nurse

## 2022-02-21 ENCOUNTER — Encounter
Admission: EM | Disposition: A | Discharge status: Hospice | Payer: Self-pay | Source: Home / Self Care | Attending: Internal Medicine

## 2022-02-21 DIAGNOSIS — M8618 Other acute osteomyelitis, other site: Secondary | ICD-10-CM | POA: Diagnosis not present

## 2022-02-21 DIAGNOSIS — R7881 Bacteremia: Secondary | ICD-10-CM | POA: Diagnosis not present

## 2022-02-21 DIAGNOSIS — M869 Osteomyelitis, unspecified: Secondary | ICD-10-CM | POA: Diagnosis not present

## 2022-02-21 DIAGNOSIS — L89312 Pressure ulcer of right buttock, stage 2: Secondary | ICD-10-CM

## 2022-02-21 DIAGNOSIS — A4189 Other specified sepsis: Secondary | ICD-10-CM

## 2022-02-21 DIAGNOSIS — I959 Hypotension, unspecified: Secondary | ICD-10-CM

## 2022-02-21 LAB — HEPARIN LEVEL (UNFRACTIONATED): Heparin Unfractionated: >1.1 IU/mL — ABNORMAL HIGH (ref 0.30–0.70)

## 2022-02-21 LAB — BLOOD CULTURE ID PANEL (REFLEXED) - BCID2

## 2022-02-21 LAB — MAGNESIUM: Magnesium: 2.2 mg/dL (ref 1.7–2.4)

## 2022-02-21 LAB — CBC
HCT: 33.3 % — ABNORMAL LOW (ref 39.0–52.0)
Hemoglobin: 11 g/dL — ABNORMAL LOW (ref 13.0–17.0)
MCH: 32.8 pg (ref 26.0–34.0)
MCHC: 33 g/dL (ref 30.0–36.0)
MCV: 99.4 fL (ref 80.0–100.0)
Platelets: 323 10*3/uL (ref 150–400)
RBC: 3.35 MIL/uL — ABNORMAL LOW (ref 4.22–5.81)
RDW: 14.3 % (ref 11.5–15.5)
WBC: 20.2 10*3/uL — ABNORMAL HIGH (ref 4.0–10.5)
nRBC: 0 % (ref 0.0–0.2)

## 2022-02-21 LAB — BASIC METABOLIC PANEL
Anion gap: 9 (ref 5–15)
BUN: 20 mg/dL (ref 8–23)
CO2: 23 mmol/L (ref 22–32)
Calcium: 7.2 mg/dL — ABNORMAL LOW (ref 8.9–10.3)
Chloride: 105 mmol/L (ref 98–111)
Creatinine, Ser: 0.63 mg/dL (ref 0.61–1.24)
GFR, Estimated: >60 mL/min (ref >60)
Glucose, Bld: 112 mg/dL — ABNORMAL HIGH (ref 70–99)
Potassium: 3.5 mmol/L (ref 3.5–5.1)
Sodium: 137 mmol/L (ref 135–145)

## 2022-02-21 LAB — APTT
aPTT: 46 seconds — ABNORMAL HIGH (ref 24–36)
aPTT: 58 seconds — ABNORMAL HIGH (ref 24–36)

## 2022-02-21 LAB — PROCALCITONIN
Procalcitonin: 0.14 ng/mL
Procalcitonin: 0.22 ng/mL

## 2022-02-21 LAB — PHOSPHORUS: Phosphorus: 2.5 mg/dL (ref 2.5–4.6)

## 2022-02-21 LAB — GLUCOSE, CAPILLARY: Glucose-Capillary: 207 mg/dL — ABNORMAL HIGH (ref 70–99)

## 2022-02-21 LAB — MRSA NEXT GEN BY PCR, NASAL: MRSA by PCR Next Gen: DETECTED — AB

## 2022-02-21 SURGERY — DEBRIDEMENT, WOUND
Anesthesia: Choice | Laterality: Right

## 2022-02-21 MED ORDER — OXYCODONE-ACETAMINOPHEN 5-325 MG PO TABS
1.0000 | ORAL_TABLET | Freq: Four times a day (QID) | ORAL | Status: DC | PRN
  Administered 2022-02-21 – 2022-03-03 (×8): 2 via ORAL
  Filled 2022-02-21 (×8): qty 2

## 2022-02-21 MED ORDER — AMIODARONE IV BOLUS ONLY 150 MG/100ML
150.0000 mg | Freq: Once | INTRAVENOUS | Status: AC
  Administered 2022-02-21: 150 mg via INTRAVENOUS
  Filled 2022-02-21: qty 100

## 2022-02-21 MED ORDER — ZINC SULFATE 220 (50 ZN) MG PO CAPS
220.0000 mg | ORAL_CAPSULE | Freq: Every day | ORAL | Status: AC
  Administered 2022-02-21 – 2022-03-06 (×14): 220 mg via ORAL
  Filled 2022-02-21 (×14): qty 1

## 2022-02-21 MED ORDER — MEDIHONEY WOUND/BURN DRESSING EX PSTE
1.0000 | PASTE | Freq: Every day | CUTANEOUS | Status: AC
  Administered 2022-02-21 – 2022-03-06 (×13): 1 via TOPICAL
  Filled 2022-02-21 (×4): qty 44

## 2022-02-21 MED ORDER — HEPARIN BOLUS VIA INFUSION
2000.0000 [IU] | Freq: Once | INTRAVENOUS | Status: AC
  Administered 2022-02-21: 2000 [IU] via INTRAVENOUS
  Filled 2022-02-21: qty 2000

## 2022-02-21 MED ORDER — SODIUM CHLORIDE 0.9 % IV BOLUS
250.0000 mL | Freq: Once | INTRAVENOUS | Status: AC
  Administered 2022-02-21: 250 mL via INTRAVENOUS

## 2022-02-21 MED ORDER — HEPARIN (PORCINE) 25000 UT/250ML-% IV SOLN
1550.0000 [IU]/h | INTRAVENOUS | Status: DC
  Administered 2022-02-21: 1300 [IU]/h via INTRAVENOUS
  Administered 2022-02-22 – 2022-02-27 (×7): 1550 [IU]/h via INTRAVENOUS
  Filled 2022-02-21 (×9): qty 250

## 2022-02-21 MED ORDER — METOPROLOL TARTRATE 25 MG PO TABS
25.0000 mg | ORAL_TABLET | Freq: Two times a day (BID) | ORAL | Status: DC
  Administered 2022-02-21: 25 mg via ORAL
  Filled 2022-02-21: qty 1

## 2022-02-21 MED ORDER — HEPARIN (PORCINE) 25000 UT/250ML-% IV SOLN: 850.0000 [IU]/h | INTRAVENOUS | Status: DC

## 2022-02-21 MED ORDER — AMIODARONE HCL IN DEXTROSE 360-4.14 MG/200ML-% IV SOLN
30.0000 mg/h | INTRAVENOUS | Status: DC
  Administered 2022-02-21 – 2022-02-24 (×6): 30 mg/h via INTRAVENOUS
  Filled 2022-02-21 (×5): qty 200

## 2022-02-21 MED ORDER — AMIODARONE HCL IN DEXTROSE 360-4.14 MG/200ML-% IV SOLN
60.0000 mg/h | INTRAVENOUS | Status: AC
  Filled 2022-02-21: qty 200

## 2022-02-21 MED ORDER — HEPARIN BOLUS VIA INFUSION
2000.0000 [IU] | Freq: Once | INTRAVENOUS | Status: DC
  Filled 2022-02-21: qty 2000

## 2022-02-21 MED ORDER — SODIUM CHLORIDE 0.9 % IV SOLN
3.0000 g | Freq: Four times a day (QID) | INTRAVENOUS | Status: DC
  Administered 2022-02-21 – 2022-02-22 (×5): 3 g via INTRAVENOUS
  Filled 2022-02-21 (×2): qty 8
  Filled 2022-02-21: qty 3
  Filled 2022-02-21: qty 8
  Filled 2022-02-21: qty 3
  Filled 2022-02-21 (×2): qty 8

## 2022-02-21 MED ORDER — VITAMIN C 500 MG PO TABS
500.0000 mg | ORAL_TABLET | Freq: Two times a day (BID) | ORAL | Status: DC
  Administered 2022-02-21 – 2022-03-07 (×28): 500 mg via ORAL
  Filled 2022-02-21 (×29): qty 1

## 2022-02-21 MED ORDER — ONDANSETRON HCL 4 MG/2ML IJ SOLN
4.0000 mg | Freq: Once | INTRAMUSCULAR | Status: AC
  Administered 2022-02-21: 4 mg via INTRAVENOUS
  Filled 2022-02-21: qty 2

## 2022-02-21 MED ORDER — MIDODRINE HCL 5 MG PO TABS
5.0000 mg | ORAL_TABLET | Freq: Three times a day (TID) | ORAL | Status: DC
  Administered 2022-02-21 – 2022-02-22 (×3): 5 mg via ORAL
  Filled 2022-02-21 (×3): qty 1

## 2022-02-21 MED ORDER — ORAL CARE MOUTH RINSE: 15.0000 mL | OROMUCOSAL | Status: DC | PRN

## 2022-02-21 MED ORDER — CHLORHEXIDINE GLUCONATE CLOTH 2 % EX PADS
6.0000 | MEDICATED_PAD | Freq: Every day | CUTANEOUS | Status: DC
  Administered 2022-02-21 – 2022-03-07 (×15): 6 via TOPICAL

## 2022-02-21 MED ORDER — MAGNESIUM SULFATE 4 GM/100ML IV SOLN
4.0000 g | Freq: Once | INTRAVENOUS | Status: AC
  Administered 2022-02-21: 4 g via INTRAVENOUS
  Filled 2022-02-21: qty 100

## 2022-02-21 MED ORDER — POTASSIUM CHLORIDE CRYS ER 20 MEQ PO TBCR
40.0000 meq | EXTENDED_RELEASE_TABLET | Freq: Once | ORAL | Status: AC
  Administered 2022-02-21: 40 meq via ORAL
  Filled 2022-02-21: qty 2

## 2022-02-21 MED ORDER — FLEET ENEMA 7-19 GM/118ML RE ENEM
1.0000 | ENEMA | Freq: Every day | RECTAL | Status: DC | PRN
  Administered 2022-02-21 – 2022-02-22 (×2): 1 via RECTAL
  Filled 2022-02-21: qty 1

## 2022-02-21 MED ORDER — MORPHINE SULFATE ER 15 MG PO TBCR
30.0000 mg | EXTENDED_RELEASE_TABLET | Freq: Two times a day (BID) | ORAL | Status: DC
  Administered 2022-02-21 – 2022-02-27 (×13): 30 mg via ORAL
  Filled 2022-02-21 (×13): qty 2

## 2022-02-21 MED ORDER — LACTATED RINGERS IV BOLUS
500.0000 mL | Freq: Once | INTRAVENOUS | Status: AC
  Administered 2022-02-21: 500 mL via INTRAVENOUS

## 2022-02-21 MED ORDER — HEPARIN BOLUS VIA INFUSION
1000.0000 [IU] | Freq: Once | INTRAVENOUS | Status: AC
  Administered 2022-02-21: 1000 [IU] via INTRAVENOUS
  Filled 2022-02-21: qty 1000

## 2022-02-21 MED ORDER — ENSURE ENLIVE PO LIQD
237.0000 mL | Freq: Three times a day (TID) | ORAL | Status: DC
  Administered 2022-02-21 – 2022-03-07 (×22): 237 mL via ORAL

## 2022-02-21 MED ORDER — ADULT MULTIVITAMIN W/MINERALS CH
1.0000 | ORAL_TABLET | Freq: Every day | ORAL | Status: DC
  Administered 2022-02-21 – 2022-03-07 (×15): 1 via ORAL
  Filled 2022-02-21 (×15): qty 1

## 2022-02-21 SURGICAL SUPPLY — 35 items
BNDG GAUZE DERMACEA FLUFF 4 (GAUZE/BANDAGES/DRESSINGS) ×1 IMPLANT
CHLORAPREP W/TINT 26 (MISCELLANEOUS) ×1 IMPLANT
CNTNR SPEC 2.5X3XGRAD LEK (MISCELLANEOUS) ×1
CONT SPEC 4OZ STER OR WHT (MISCELLANEOUS) ×1
CONT SPEC 4OZ STRL OR WHT (MISCELLANEOUS) ×1
CONTAINER SPEC 2.5X3XGRAD LEK (MISCELLANEOUS) ×1 IMPLANT
DRAIN PENROSE 12X.25 LTX STRL (MISCELLANEOUS) ×1 IMPLANT
DRAPE LAPAROTOMY 77X122 PED (DRAPES) ×1 IMPLANT
DRSG GAUZE FLUFF 36X18 (GAUZE/BANDAGES/DRESSINGS) ×1 IMPLANT
ELECT CAUTERY BLADE TIP 2.5 (TIP) ×1
ELECT REM PT RETURN 9FT ADLT (ELECTROSURGICAL) ×1
ELECTRODE CAUTERY BLDE TIP 2.5 (TIP) ×1 IMPLANT
ELECTRODE REM PT RTRN 9FT ADLT (ELECTROSURGICAL) ×1 IMPLANT
GAUZE 4X4 16PLY ~~LOC~~+RFID DBL (SPONGE) ×1 IMPLANT
GAUZE SPONGE 4X4 12PLY STRL (GAUZE/BANDAGES/DRESSINGS) ×1 IMPLANT
GLOVE SURG SYN 7.0 (GLOVE) ×1 IMPLANT
GLOVE SURG SYN 7.5  E (GLOVE) ×1
GLOVE SURG SYN 7.5 E (GLOVE) ×1 IMPLANT
GOWN STRL REUS W/ TWL LRG LVL3 (GOWN DISPOSABLE) ×2 IMPLANT
GOWN STRL REUS W/TWL LRG LVL3 (GOWN DISPOSABLE) ×2
KIT TURNOVER KIT A (KITS) ×1 IMPLANT
LABEL OR SOLS (LABEL) ×1 IMPLANT
MANIFOLD NEPTUNE II (INSTRUMENTS) ×1 IMPLANT
NEEDLE HYPO 22GX1.5 SAFETY (NEEDLE) ×1 IMPLANT
NS IRRIG 500ML POUR BTL (IV SOLUTION) ×1 IMPLANT
PACK BASIN MINOR ARMC (MISCELLANEOUS) ×1 IMPLANT
PAD ABD DERMACEA PRESS 5X9 (GAUZE/BANDAGES/DRESSINGS) ×1 IMPLANT
SOL PREP PVP 2OZ (MISCELLANEOUS) ×1
SOLUTION PREP PVP 2OZ (MISCELLANEOUS) ×1 IMPLANT
SPONGE T-LAP 18X18 ~~LOC~~+RFID (SPONGE) ×2 IMPLANT
SWAB CULTURE AMIES ANAERIB BLU (MISCELLANEOUS) ×2 IMPLANT
SYR 20ML LL LF (SYRINGE) ×1 IMPLANT
SYR BULB IRRIG 60ML STRL (SYRINGE) ×1 IMPLANT
TRAP FLUID SMOKE EVACUATOR (MISCELLANEOUS) ×1 IMPLANT
WATER STERILE IRR 500ML POUR (IV SOLUTION) ×1 IMPLANT

## 2022-02-21 NOTE — Progress Notes (Signed)
NAME:  Jesse PoagGordon B Lowenstein, MRN:  [id number], DOB:  02/22/1956, LOS: 1 ADMISSION DATE:  02/20/2022, CHIEF COMPLAINT:  shock   History of Present Illness:  66 yo M presenting to [facility] ED from home on 02/20/2022 for evaluation of high white blood cell count at the encouragement of the patient's PCP.    2-1/2 weeks ago the patient fell out of his wheelchair causing bruising to the right lower extremity.  Of note the patient is a quadriplegic and is wheelchair-bound.  Around the same time the patient reports poor p.o. intake, increased fatigue, increased right lower extremity swelling and he was evaluated by his PCP.  There was concern for possible cellulitis in the right lower extremity as well as possible infection of a wound on his buttocks. He was started on Levaquin & Flagyl and lab work was sent off.  The patient was also seen by vascular and ultrasound was performed of the right lower extremity which was negative for DVT.  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  Of note he was admitted in September 2023 septic from a UTI, and during said admission he developed atrial fibrillation with rapid ventricular response which converted to normal sinus rhythm after diltiazem.  He has been out of diltiazem for the past week, and was taken off the lopressor immediately after discharge due to low heart rate noted at a follow-up PCP appointment outpatient.    ED course: Upon arrival vital signs stable, however patient quickly became tachypneic, hypotensive with A-fib RVR.  Sepsis protocol initiated, and blood pressure appeared fluid responsive at first. After 3 L IV fluid bolus however BP remains soft and peripheral vasopressors initiated. Imaging revealed a subacute commuted fracture of the fibular head and subacute commuted fracture of the right tibial plateau. Orthopedics was consulted by EDP, however as patient is wheelchair-bound and  quadriplegic no surgical intervention at this time. Imaging also suggestive of osteomyelitis in pressure injury on right ischial tuberosity.  Lab work significant for mild hypokalemia, lactic acidosis, hypoalbuminemia & leukocytosis.   CT femur RIGHT wo contrast 02/20/22:  Subacute comminuted fracture of the right tibial plateau with dissociation of the tibial metaphysis and proximal diaphysis (Schatzker type 6). Subacute comminuted fracture of the fibular head. 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 Small amount of subcutaneous emphysema in the inferior right buttock tracking towards the ischial tuberosity. correlate for decubitus ulcer. Large knee hemarthrosis. Significant diffuse soft tissue swelling of the visualized right leg. No abscess.   PCCM consulted for admission due to sepsis with shock secondary to osteomyelitis from pressure injury on right ischial tuberosity requiring peripheral vasopressor support.  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  Patient also in atrial fibrillation with rapid ventricular response.  Interim History / Subjective:  Feels improved, continues on norepinephrine.  Objective   Blood pressure (!) 86/57, pulse 96, temperature 97.7 F (36.5 C), temperature source Oral, resp. rate 17, weight 68 kg, SpO2 96 %.        Intake/Output Summary (Last 24 hours) at 02/21/2022 1643 Last data filed at 02/21/2022 1400 Gross  per 24 hour  Intake 654.81 ml  Output 700 ml  Net -45.19 ml   Filed Weights    02/20/22 1427  Weight: 68 kg    Examination: Physical Exam Vitals and nursing note reviewed.  Constitutional:      General: He is not in acute distress.    Appearance: He is ill-appearing.  HENT:     Head: Normocephalic.     Mouth/Throat:     Mouth: Mucous membranes are moist.  Eyes:     Extraocular Movements: Extraocular movements intact.  Cardiovascular:     Rate and Rhythm: Normal rate and regular rhythm.     Heart sounds: Normal heart sounds.  Pulmonary:     Effort: No respiratory distress.     Breath sounds: Normal breath sounds.  Abdominal:     Palpations: Abdomen is soft.  Neurological:     Mental Status: He is alert and oriented to person, place, and time.     Motor: Weakness present.      Assessment & Plan:   66 year old male with history of quadriplegia presented to the hospital with hypotension found to have septic shock secondary to MSSA bacteremia likely from infected decubitus ulcer and osteomyelitis.  Neurology: #Quadriplegia #Autonomic dysreflexia #Chronic Pain  At baseline, continue home meds. He's on diazepam and MS contin at home which we will continue (albeit reduced dose). Will also initiate IV morphine for breakthrough pain.  Respiratory: On room air. No active issues, continue to monitor  Cardiovascular: #Septic Shock #Afib with RVR  On nor-epinephrine for septic shock secondary to bacteremia with difficult to control afib. On and off nor-epinephrine throughout the day. Lactates have normalized. Did trial low dose metoprolol in the AM while off pressors, but has since resumed on nor-epi. Will consider amio for better rate control should he remain nor-epi dependent. Last TTE from September showed diastolic dysfunction.  -500 mL LR bolus -norepi goal MAP > 65 mmHg  Renal: #Neurogenic bladder  Kidney function at baseline. Will continue to monitor electrolytes -PRN catheterization, avoid indwelling foley catheter  Endo: Monitor glucose.  Only minimal pressor requirements, will shy away from hydrocortisone at the moment as he is not chronically on steroids.  GI: Liberalize diet. No need for GI prophylaxis. Liver function tests within normal on admission.  Hem/Onc: Heparin gtt for afib  ID: #Septic Shock #Osteomyelitis #MSSA bacteremia  Growing MSSA in blood cultures, likely source osteomyelitis vs. Infected decub ulcer. Highly appreciate input from various consulting services (General Surgery, Infectious Diseases, Orthopedics). Switched to Unasyn per recommendation from ID.  MSK: #Subacute fracture - right tibial plateau #Pressure injury - right ischial tuberosity  Orthopedics and general surgery consulted, recommended conservative management at the moment.  Best Practice (right click and "Reselect all SmartList Selections" daily)   Diet/type: Regular consistency (see orders) DVT prophylaxis: systemic heparin GI prophylaxis: N/A Lines: N/A Foley:  N/A Code Status:  full code Last date of multidisciplinary goals of care discussion [02/21/2022]  Labs   CBC: Recent Labs  Lab 02/20/22 1431 02/21/22 0403  WBC 20.2* 20.2*  NEUTROABS 18.1*  --   HGB 12.3* 11.0*  HCT 37.8* 33.3*  MCV 98.7 99.4  PLT 345 323    Basic Metabolic Panel: Recent Labs  Lab 02/20/22 1431 02/20/22 2303 02/21/22 0403  NA 135  --  137  K 3.4*  --  3.5  CL 98  --  105  CO2 31  --  23  GLUCOSE 130*  --  112*  BUN 21  --  20  CREATININE 0.88  --  0.63  CALCIUM 7.7*  --  7.2*  MG  --  1.5* 2.2  PHOS  --  2.7 2.5   GFR: Estimated Creatinine Clearance: 88.5 mL/min (by C-G formula based on SCr of 0.63 mg/dL). Recent Labs  Lab 02/20/22 1431 02/20/22 1630 02/20/22 2303 02/21/22 0403  PROCALCITON  --   --  0.14 0.22  WBC 20.2*  --   --  20.2*  LATICACIDVEN 2.8* 2.7* 1.8  --     Liver Function Tests: Recent Labs  Lab 02/20/22 1431  AST 17  ALT 11  ALKPHOS 65  BILITOT 0.9  PROT 5.0*  ALBUMIN 2.0*   No results for  input(s): "LIPASE", "AMYLASE" in the last 168 hours. No results for input(s): "AMMONIA" in the last 168 hours.  ABG No results found for: "PHART", "PCO2ART", "PO2ART", "HCO3", "TCO2", "ACIDBASEDEF", "O2SAT"   Coagulation Profile: Recent Labs  Lab 02/20/22 2303  INR 3.3*    Cardiac Enzymes: Recent Labs  Lab 02/20/22 1547  CKTOTAL 27*    HbA1C: No results found for: "HGBA1C"  CBG: No results for input(s): "GLUCAP" in the last 168 hours.  Past Medical History:  He,  has a past medical history of Autonomic dysreflexia, Hypertension, Quadriplegia following spinal cord injury (1981), and Spinal cord injury at C5-C7 level without injury of spinal bone (HCC).   Surgical History:   Past Surgical History:  Procedure Laterality Date   ANKLE SURGERY     BACK SURGERY     COLONOSCOPY     HEMORRHOID SURGERY  2008   PERIPHERAL VASCULAR THROMBECTOMY Right 11/24/2018   Procedure: PERIPHERAL VASCULAR THROMBECTOMY;  Surgeon: Annice Needy, MD;  Location: [facility] INVASIVE CV LAB;  Service: Cardiovascular;  Laterality: Right;   SPINE SURGERY       Social History:   reports that he has never smoked. He quit smokeless tobacco use about 35 years ago.  His smokeless tobacco use included chew. He reports current alcohol use of about 1.0 standard drink of alcohol per week. He reports that he does not use drugs.   Family History:  His family history includes Breast cancer (age of onset: 35) in his mother.   Allergies Allergies  Allergen Reactions   Contrast Media [Iodinated Contrast Media] Nausea And Vomiting   Iohexol Nausea And Vomiting     Home Medications  Prior to Admission medications   Medication Sig Start Date End Date Taking? Authorizing Provider  allopurinol (ZYLOPRIM) 100 MG tablet Take 100 mg by mouth daily.   Yes [provider]  apixaban (ELIQUIS) 5 MG TABS tablet Take 1 tablet (5 mg total) by mouth 2 (two) times daily. 02/13/22  Yes Georgiana Spinner, NP  diazepam  (VALIUM) 5 MG tablet Take 5 mg by mouth every 6 (six) hours as needed for anxiety.   Yes [provider]  diltiazem (CARDIZEM CD) 180 MG 24 hr capsule Take 1 capsule (180 mg total) by mouth daily. 12/14/21  Yes Burnadette Pop, MD  levofloxacin (LEVAQUIN) 500 MG tablet Take 500 mg by mouth daily.   Yes [provider]  metroNIDAZOLE (FLAGYL) 500 MG tablet Take 500 mg by mouth 3 (three) times daily.   Yes [provider]  morphine (MS CONTIN) 60 MG 12 hr tablet TAKE 2 TABLETS BY MOUTH TWICE A DAY FOR PAIN 11/03/18  Yes [provider]  Oxycodone HCl 10 MG TABS Take 10 mg by mouth 2 (two)  times daily.   Yes [provider]  oxyCODONE (ROXICODONE) 15 MG immediate release tablet TAKE 1 TABLET BY MOUTH UP TO 3 TIMES DAILY FOR BREAKTHROUGH PAIN Patient not taking: Reported on 02/20/2022 12/04/17   [provider]     Critical care time: 61 minutes

## 2022-02-21 NOTE — Consult Note (Signed)
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 Wound type:infectious, pressure, venous insufficiency, trauma Pressure Injury POA: Yes Measurement:Bedside RN to measure wounds to right IT and right lateral malleolus and document measurements on Nursing Flow Sheet with application of next dressings Wound FYB:OFBPZW provided to the EMR on admission by Provider Drainage (amount, consistency, odor) Small light yellow from right IT, scant serous from RLE and none from right wrist Periwound:ecchymosis to RLE from recent fall, maceration in the periwound area at right IT Dressing procedure/placement/frequency:The patient is in the ICU where he is on a mattress replacement with los air loss feature. He is being turned from side to side per house protocol. I have provided guidance for placement of a silicone foam to the sacrum for PI prophylaxis. I have provided guidance for Nursing for the topical care of the Unstageable right IT and right lateral malleolus wounds daily with a NS cleanse followed by application of MediHoney (antimicrobial gel) for an antimicrobial and moisture retaining wound environment conducive to autolytic debridement. The right forearm lacerations will be washed daily with soap and water, rinsed and dried, then dressed with folded layers of xeroform gauze (antimicrobial nonadherent) topped with an ABD and secured with a few turns of Kerlix roll gauze/paper tape. Bilateral Prevalon Boots are to be placed for floatation of heels and PI prevention.  As the right IT ulcer film was concerning for osteomyelitis and because osteomyelitis is outside the scope of WOC Nursing practice, recommend consultation with General Surgery for evaluation and POC implementation and oversight for the right  IT pressure injury wound. Any orders provided by those Providers will supercede those I have written today.  WOC nursing team will not follow, but will remain available to this patient, the nursing and medical teams.  Please re-consult if needed.  Thank you for inviting [place] to participate in this patient's Plan of Care.  Ladona Mow, MSN, RN, CNS, GNP, Leda Min, [employer], [employer] phone:  [phone]
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
## 2022-02-21 NOTE — Progress Notes (Addendum)
Initial Nutrition Assessment  DOCUMENTATION CODES:   Not applicable  INTERVENTION:   -Ensure Enlive po TID, each supplement provides 350 kcal and 20 grams of protein.  -MVI with minerals daily -500 mg vitamin C BID -220 mg zinc sulfate daily -Monitor labs and replete as necessary for potential deficiencies: vitamin C, zinc, and vitamin A secondary to non-healing wounds  NUTRITION DIAGNOSIS:   Increased nutrient needs related to wound healing as evidenced by estimated needs.  GOAL:   Patient will meet greater than or equal to 90% of their needs  MONITOR:   PO intake, Supplement acceptance  REASON FOR ASSESSMENT:   Consult Assessment of nutrition requirement/status, Wound healing  ASSESSMENT:   Pt with PMH of quadriplegia secondary to spinal cord injury, HTN, and autonomic dysreflexia admitted with sepsis with septic shock 2/2 osteomyelitis from unstageable pressure injury to rt ischial tuberosity.  Reviewed I/O's: -226 ml x 24 hours  UOP: 700 ml x 24 hours   Per [facility] notes, pt with unstageable wound to rt ischial tuberosity and full thickness wound to rt lateral malleolus.   Case discussed with RN, MD, and during ICU rounds. Pt is alert and oriented x 4. Pt with abdominal distention and fleet enemas have been ordered, as this is what pt uses at home for constipation. Pt also complains of hunger and was advanced to a regular diet. Plan to decrease pressor today. General surgery has not plans to debride wound. Pt awaiting ID and orthopedics consults for suspected osteomyelitis.   Spoke with pt and wife at bedside. Pt reports general decline in health over the past 3-4 weeks, which he attributes to side effects of antibiotics. Per pt, the antibiotics have caused a decrease in appetite, which has caused him to eat less and make him weaker. He explains that in his usual state of health, he is very active using a transfer board to help him transfer to a wheelchair and spends most  of the day with his dog and family members target shooting on their large property in [place]. Pt reports that over the past few days is has been difficult to move his head as he has become so weak.   In his usual state of health, pt consumes 2-3 meals per day (Breakfast: grits or oatmeal; Lunch and Dinner: meat, starch, and vegetable). Pt wife works second shift and prepares breakfast and lunch for him and ensures that he has something that he can microwave for dinner. Over the past few weeks, pt will eat a half bowl of grits, a sandwich for lunch, and often no dinner. He complains of hunger pains and has just ordered lunch; he states that he thinks being able to eat will make him feel better.   Per pt, he has had ischial wound for approximately 6 months and wife assists with daily dressing changes.   Pt is unsure of UBW and states he is has not weighed himself in several years. He is unsure of UBW. Noted pt has experienced a 7.4% wt loss over the past 2 months, which is significant for time frame.   Discussed importance of good meal and supplement intake, especially for wound healing. Per pt, he takes zinc and vitamin C at home to help support wound healing, as well as drinking Ensure supplements intermittently. Pt amenable to vitamins and supplements to help support wound healing.   Medications reviewed and include levophed.   Labs reviewed: CBGS: 83.    NUTRITION - FOCUSED PHYSICAL EXAM:  Flowsheet Row Most Recent Value  Orbital Region No depletion  Upper Arm Region No depletion  Thoracic and Lumbar Region No depletion  Buccal Region No depletion  Temple Region No depletion  Clavicle Bone Region No depletion  Clavicle and Acromion Bone Region No depletion  Scapular Bone Region No depletion  Dorsal Hand No depletion  Patellar Region No depletion  Anterior Thigh Region No depletion  Posterior Calf Region No depletion  Edema (RD Assessment) Mild  Hair Reviewed  Eyes Reviewed  Mouth  Reviewed  Skin Reviewed  Nails Reviewed       Diet Order:   Diet Order             Diet regular Room service appropriate? Yes; Fluid consistency: Thin  Diet effective now                   EDUCATION NEEDS:   Education needs have been addressed  Skin:  Skin Assessment: Skin Integrity Issues: Skin Integrity Issues:: Unstageable, Other (Comment) Unstageable: rt ischial tuberosity Other: full thickness wound to rt lateral mallelous  Last BM:  Unknown  Height:   Ht Readings from Last 1 Encounters:  12/11/21 5\' 8"  (1.727 m)    Weight:   Wt Readings from Last 1 Encounters:  02/20/22 68 kg   BMI:  Body mass index is 22.81 kg/m.  Estimated Nutritional Needs:   Kcal:  2050-2250  Protein:  105-120 grams  Fluid:  > 2 L    11-02-1978, RD, LDN, CDCES Registered Dietitian II Certified Diabetes Care and Education Specialist Please refer to [facility] for RD and/or RD on-call/weekend/after hours pager
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
## 2022-02-21 NOTE — Consult Note (Signed)
PHARMACY CONSULT NOTE - FOLLOW UP  Pharmacy Consult for Electrolyte Monitoring and Replacement   Recent Labs: Potassium (mmol/L)  Date Value  02/21/2022 3.5   Magnesium (mg/dL)  Date Value  66/44/0347 2.2   Calcium (mg/dL)  Date Value  42/59/5638 7.2 (L)   Albumin (g/dL)  Date Value  75/64/3329 2.0 (L)   Phosphorus (mg/dL)  Date Value  51/88/4166 2.5   Sodium (mmol/L)  Date Value  02/21/2022 137     Assessment: 65yo M w/ h/o functional quadriplegia, h/o DVT, syringomyelia, Aflutter, peripheral neuropathy, BPH, DJD of shoulder, & neurogenic bladder presenting for evaluation of  Leukocytosis from PCP w/u for RLE c/f cellulitis. Pharmacy consulted for electrolyte mgmt.  Goal of Therapy:  Lytes WNL (K>4, Mg>2)  Plan:  K 3.4>3.5: LLN but off from goal with h/o AF will aim for middle of goal range. Replete with KCL PO x1 Mg 1.5>2.2: after receiving MgSO 4g IV x1 11/21 PM.  WNL, no further repletion at this time.  Martyn Malay ,PharmD Clinical Pharmacist 02/21/2022 7:49 AM

## 2022-02-21 NOTE — Progress Notes (Addendum)
Pharmacy Antibiotic Note  Jesse Whitney is a 66 y.o. male admitted on 02/20/2022 with  osteomyelitis (Non-vertebral) .  Pharmacy has been consulted for Vancomycin, Cefepime dosing.  Plan: 11/22 BCID update -  2 of 4 bottles (same set) positive for Staph aureus - no resistance genes detected. Pt currently on Vancomycin & Cefepime. Per d/w ID will narrow to unasyn alone at this time.  Cefepime 2 gm IV X 1; f/b Cefepime 2 gm IV Q8H >>> stopped 11/22 PM per BCID & ID rec. Vancomycin 1500 mg IV x1; f/b Vancomycin 1750 mg IV Q24H  >>> stopped 11/22 per BCID & ID rec.  Transition to Unasyn 3.375g IV q8h starting now.    Weight: 68 kg (150 lb)  Temp (24hrs), Avg:98.2 F (36.8 C), Min:97.9 F (36.6 C), Max:98.5 F (36.9 C)  Recent Labs  Lab 02/20/22 1431 02/20/22 1630 02/20/22 2303 02/21/22 0403  WBC 20.2*  --   --  20.2*  CREATININE 0.88  --   --  0.63  LATICACIDVEN 2.8* 2.7* 1.8  --      Estimated Creatinine Clearance: 88.5 mL/min (by C-G formula based on SCr of 0.63 mg/dL).    Allergies  Allergen Reactions   Contrast Media [Iodinated Contrast Media] Nausea And Vomiting   Iohexol Nausea And Vomiting    Antimicrobials this admission: VAN/CFP  (11/21>> 11/22); then Unasyn (11/22>>  Dose adjustments this admission: CTM and adjust PRN. Renal fxn recovered significantly.  Microbiology results: 11/21 BCx:  2 of 4 bottles (same set) positive for Staph aureus - no resistance genes detected. 11/21 MRSA PCR: Positive  Thank you for allowing pharmacy to be a part of this patient's care.  Sherlynn Carbon Lacreasha Hinds 02/21/2022 7:45 AM

## 2022-02-22 ENCOUNTER — Inpatient Hospital Stay
Admit: 2022-02-22 | Discharge: 2022-02-22 | Disposition: A | Discharge status: Home / Self Care | Payer: PPO | Attending: Infectious Diseases | Admitting: Infectious Diseases

## 2022-02-22 DIAGNOSIS — M869 Osteomyelitis, unspecified: Secondary | ICD-10-CM | POA: Diagnosis not present

## 2022-02-22 DIAGNOSIS — S82201A Unspecified fracture of shaft of right tibia, initial encounter for closed fracture: Secondary | ICD-10-CM | POA: Diagnosis not present

## 2022-02-22 DIAGNOSIS — B9561 Methicillin susceptible Staphylococcus aureus infection as the cause of diseases classified elsewhere: Secondary | ICD-10-CM | POA: Diagnosis not present

## 2022-02-22 DIAGNOSIS — A419 Sepsis, unspecified organism: Secondary | ICD-10-CM | POA: Diagnosis not present

## 2022-02-22 DIAGNOSIS — R7881 Bacteremia: Secondary | ICD-10-CM | POA: Diagnosis not present

## 2022-02-22 DIAGNOSIS — S82401A Unspecified fracture of shaft of right fibula, initial encounter for closed fracture: Secondary | ICD-10-CM

## 2022-02-22 DIAGNOSIS — L89312 Pressure ulcer of right buttock, stage 2: Secondary | ICD-10-CM | POA: Diagnosis not present

## 2022-02-22 LAB — ECHOCARDIOGRAM COMPLETE
AR max vel: 3.11 cm2
AV Area VTI: 3.47 cm2
AV Area mean vel: 3.6 cm2
AV Mean grad: 1 mmHg
AV Peak grad: 2.7 mmHg
Ao pk vel: 0.83 m/s
Area-P 1/2: 6.27 cm2
S' Lateral: 2.9 cm
Weight: 2400 oz

## 2022-02-22 LAB — CBC
HCT: 35.1 % — ABNORMAL LOW (ref 39.0–52.0)
Hemoglobin: 11.8 g/dL — ABNORMAL LOW (ref 13.0–17.0)
MCH: 33 pg (ref 26.0–34.0)
MCHC: 33.6 g/dL (ref 30.0–36.0)
MCV: 98 fL (ref 80.0–100.0)
Platelets: 331 10*3/uL (ref 150–400)
RBC: 3.58 MIL/uL — ABNORMAL LOW (ref 4.22–5.81)
RDW: 14.6 % (ref 11.5–15.5)
WBC: 16 10*3/uL — ABNORMAL HIGH (ref 4.0–10.5)
nRBC: 0 % (ref 0.0–0.2)

## 2022-02-22 LAB — BASIC METABOLIC PANEL
Anion gap: 5 (ref 5–15)
BUN: 23 mg/dL (ref 8–23)
CO2: 28 mmol/L (ref 22–32)
Calcium: 7.5 mg/dL — ABNORMAL LOW (ref 8.9–10.3)
Chloride: 106 mmol/L (ref 98–111)
Creatinine, Ser: 0.76 mg/dL (ref 0.61–1.24)
GFR, Estimated: >60 mL/min (ref >60)
Glucose, Bld: 120 mg/dL — ABNORMAL HIGH (ref 70–99)
Potassium: 3.4 mmol/L — ABNORMAL LOW (ref 3.5–5.1)
Sodium: 139 mmol/L (ref 135–145)

## 2022-02-22 LAB — APTT
aPTT: 51 seconds — ABNORMAL HIGH (ref 24–36)
aPTT: 78 seconds — ABNORMAL HIGH (ref 24–36)
aPTT: 82 seconds — ABNORMAL HIGH (ref 24–36)

## 2022-02-22 LAB — PROCALCITONIN: Procalcitonin: 0.28 ng/mL

## 2022-02-22 LAB — PHOSPHORUS: Phosphorus: 2.1 mg/dL — ABNORMAL LOW (ref 2.5–4.6)

## 2022-02-22 LAB — MAGNESIUM: Magnesium: 2.2 mg/dL (ref 1.7–2.4)

## 2022-02-22 LAB — HEPARIN LEVEL (UNFRACTIONATED): Heparin Unfractionated: >1.1 IU/mL — ABNORMAL HIGH (ref 0.30–0.70)

## 2022-02-22 MED ORDER — CEFAZOLIN SODIUM-DEXTROSE 2-4 GM/100ML-% IV SOLN
2.0000 g | Freq: Three times a day (TID) | INTRAVENOUS | Status: DC
  Administered 2022-02-22 – 2022-03-02 (×21): 2 g via INTRAVENOUS
  Filled 2022-02-22 (×25): qty 100

## 2022-02-22 MED ORDER — LACTATED RINGERS IV BOLUS
1000.0000 mL | Freq: Once | INTRAVENOUS | Status: AC
  Administered 2022-02-22: 1000 mL via INTRAVENOUS

## 2022-02-22 MED ORDER — POTASSIUM PHOSPHATES 15 MMOLE/5ML IV SOLN
15.0000 mmol | Freq: Once | INTRAVENOUS | Status: AC
  Administered 2022-02-22: 15 mmol via INTRAVENOUS
  Filled 2022-02-22: qty 5

## 2022-02-22 MED ORDER — HEPARIN BOLUS VIA INFUSION
2000.0000 [IU] | Freq: Once | INTRAVENOUS | Status: AC
  Administered 2022-02-22: 2000 [IU] via INTRAVENOUS
  Filled 2022-02-22: qty 2000

## 2022-02-22 MED ORDER — MIDODRINE HCL 5 MG PO TABS
10.0000 mg | ORAL_TABLET | Freq: Three times a day (TID) | ORAL | Status: DC
  Administered 2022-02-22 – 2022-03-04 (×26): 10 mg via ORAL
  Filled 2022-02-22 (×27): qty 2

## 2022-02-22 MED ORDER — METRONIDAZOLE 500 MG/100ML IV SOLN
500.0000 mg | Freq: Two times a day (BID) | INTRAVENOUS | Status: DC
  Administered 2022-02-22 – 2022-02-26 (×8): 500 mg via INTRAVENOUS
  Filled 2022-02-22 (×8): qty 100

## 2022-02-22 NOTE — Progress Notes (Signed)
*  PRELIMINARY RESULTS* Echocardiogram 2D Echocardiogram has been performed.  Joanette Gula Gilad Dugger 02/22/2022, 3:02 PM
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
## 2022-02-22 NOTE — Consult Note (Signed)
PHARMACY CONSULT NOTE  Pharmacy Consult for Electrolyte Monitoring and Replacement   Recent Labs: Potassium (mmol/L)  Date Value  02/22/2022 3.4 (L)   Magnesium (mg/dL)  Date Value  27/78/2423 2.2   Calcium (mg/dL)  Date Value  53/61/4431 7.5 (L)   Albumin (g/dL)  Date Value  54/00/8676 2.0 (L)   Phosphorus (mg/dL)  Date Value  19/50/9326 2.1 (L)   Sodium (mmol/L)  Date Value  02/22/2022 139     Assessment: 65yo M w/ h/o functional quadriplegia, h/o DVT, syringomyelia, Aflutter, peripheral neuropathy, BPH, DJD of shoulder, & neurogenic bladder presenting for evaluation of  Leukocytosis from PCP w/u for RLE c/f cellulitis. Pharmacy consulted for electrolyte mgmt.  Goal of Therapy:  Potassium 4.0 - 5.1 mmol/L Magnesium 2.0 - 2.4 mg/dL All Other Electrolytes WNL   Plan:  15 mmol IV potassium phosphate x 1 (contains 22 mEq IV potassium) per NP Recheck electrolytes in am.   Lowella Bandy ,PharmD Clinical Pharmacist 02/22/2022 7:48 AM
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
## 2022-02-22 NOTE — Progress Notes (Signed)
NAME:  Jesse Whitney, MRN:  [id number], DOB:  10/07/1955, LOS: 2 ADMISSION DATE:  02/20/2022, CHIEF COMPLAINT:  shock   History of Present Illness:  66 yo M presenting to [facility] ED from home on 02/20/2022 for evaluation of high white blood cell count at the encouragement of the patient's PCP.    2-1/2 weeks ago the patient fell out of his wheelchair causing bruising to the right lower extremity.  Of note the patient is a quadriplegic and is wheelchair-bound.  Around the same time the patient reports poor p.o. intake, increased fatigue, increased right lower extremity swelling and he was evaluated by his PCP.  There was concern for possible cellulitis in the right lower extremity as well as possible infection of a wound on his buttocks. He was started on Levaquin & Flagyl and lab work was sent off.  The patient was also seen by vascular and ultrasound was performed of the right lower extremity which was negative for DVT.  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  Of note he was admitted in September 2023 septic from a UTI, and during said admission he developed atrial fibrillation with rapid ventricular response which converted to normal sinus rhythm after diltiazem.  He has been out of diltiazem for the past week, and was taken off the lopressor immediately after discharge due to low heart rate noted at a follow-up PCP appointment outpatient.    ED course: Upon arrival vital signs stable, however patient quickly became tachypneic, hypotensive with A-fib RVR.  Sepsis protocol initiated, and blood pressure appeared fluid responsive at first. After 3 L IV fluid bolus however BP remains soft and peripheral vasopressors initiated. Imaging revealed a subacute commuted fracture of the fibular head and subacute commuted fracture of the right tibial plateau. Orthopedics was consulted by EDP, however as patient is wheelchair-bound and  quadriplegic no surgical intervention at this time. Imaging also suggestive of osteomyelitis in pressure injury on right ischial tuberosity.  Lab work significant for mild hypokalemia, lactic acidosis, hypoalbuminemia & leukocytosis.   CT femur RIGHT wo contrast 02/20/22:  Subacute comminuted fracture of the right tibial plateau with dissociation of the tibial metaphysis and proximal diaphysis (Schatzker type 6). Subacute comminuted fracture of the fibular head. 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 Small amount of subcutaneous emphysema in the inferior right buttock tracking towards the ischial tuberosity. correlate for decubitus ulcer. Large knee hemarthrosis. Significant diffuse soft tissue swelling of the visualized right leg. No abscess.   PCCM consulted for admission due to sepsis with shock secondary to osteomyelitis from pressure injury on right ischial tuberosity requiring peripheral vasopressor support.  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  Patient also in atrial fibrillation with rapid ventricular response. 02/21/2022: blood cultures with MSSA, ID consulted  Interim History / Subjective:  Feels improved, continues on norepinephrine.  Objective   Blood pressure (!) 87/66, pulse (!) 109, temperature 99.3 F (37.4 C), temperature source Oral, resp. rate (!) 28, weight 68 kg, SpO2 96 %.        Intake/Output Summary (Last 24 hours) at  02/22/2022 0811 Last data filed at 02/22/2022 0400 Gross per 24 hour  Intake  1363.29 ml  Output 450 ml  Net 913.29 ml    Filed Weights   02/20/22 1427  Weight: 68 kg    Examination: Physical Exam Vitals and nursing note reviewed.  Constitutional:      General: He is not in acute distress.    Appearance: He is ill-appearing.  HENT:     Head: Normocephalic.     Mouth/Throat:     Mouth: Mucous membranes are moist.  Eyes:     Extraocular Movements: Extraocular movements intact.  Cardiovascular:     Rate and Rhythm: Normal rate and regular rhythm.     Heart sounds: Normal heart sounds.  Pulmonary:     Effort: No respiratory distress.     Breath sounds: Normal breath sounds.  Abdominal:     Palpations: Abdomen is soft.  Neurological:     Mental Status: He is alert and oriented to person, place, and time.     Motor: Weakness present.      Assessment & Plan:   66 year old male with history of quadriplegia presented to the hospital with hypotension found to have septic shock secondary to MSSA bacteremia likely from infected decubitus ulcer and osteomyelitis.  Neurology: #Quadriplegia #Autonomic dysreflexia #Chronic Pain  At baseline, continue home meds. He's on diazepam and MS contin at home which we will continue (albeit reduced dose). Will also initiate PO for breakthrough medicine.  Respiratory: On room air. No active issues, continue to monitor  Cardiovascular: #Septic Shock #Afib with RVR  Was on nor-epinephrine for septic shock secondary to bacteremia with difficult to control afib. On and off nor-epinephrine throughout the day yesterday. Lactates have normalized. Did trial low dose metoprolol yesterday while off pressors. Received amiodarone for rate control given nor-epinephrine, will likely restart metoprolol with stable blood pressures. Last TTE from September showed diastolic dysfunction.  -500 mL LR bolus yesterday -goal SBP > 80 mm Hg  Renal: #Neurogenic bladder  Kidney function at baseline. Will continue to monitor  electrolytes -PRN catheterization, avoid indwelling foley catheter  Endo: Monitor glucose. Only minimal pressor requirements, will shy away from hydrocortisone at the moment as he is not chronically on steroids.  GI: Liberalize diet. No need for GI prophylaxis. Liver function tests within normal on admission.  Hem/Onc: Heparin gtt for afib  ID: #Septic Shock #Osteomyelitis #MSSA bacteremia  Growing MSSA in blood cultures, likely source osteomyelitis vs. Infected decub ulcer. Highly appreciate input from various consulting services (General Surgery, Infectious Diseases, Orthopedics). Switched to Unasyn per recommendation from ID.  -repeat blood cultures today  MSK: #Subacute fracture - right tibial plateau #Pressure injury - right ischial tuberosity  Orthopedics and general surgery consulted, recommended conservative management at the moment.  Best Practice (right click and "Reselect all SmartList Selections" daily)   Diet/type: Regular consistency (see orders) DVT prophylaxis: systemic heparin GI prophylaxis: N/A Lines: N/A Foley:  N/A Code Status:  full code Last date of multidisciplinary goals of care discussion [02/22/2022]  Labs   CBC: Recent Labs  Lab 02/20/22 1431 02/21/22 0403 02/22/22 0523  WBC 20.2* 20.2* 16.0*  NEUTROABS 18.1*  --   --   HGB 12.3* 11.0* 11.8*  HCT 37.8* 33.3* 35.1*  MCV 98.7 99.4 98.0  PLT 345 323 331     Basic Metabolic Panel: Recent Labs  Lab 02/20/22 1431 02/20/22 2303 02/21/22 0403 02/22/22 0523  NA 135  --  137 139  K  3.4*  --  3.5 3.4*  CL 98  --  105 106  CO2 31  --  23 28  GLUCOSE 130*  --  112* 120*  BUN 21  --  20 23  CREATININE 0.88  --  0.63 0.76  CALCIUM 7.7*  --  7.2* 7.5*  MG  --  1.5* 2.2 2.2  PHOS  --  2.7 2.5 2.1*    GFR: Estimated Creatinine Clearance: 88.5 mL/min (by C-G formula based on SCr of 0.76 mg/dL). Recent Labs  Lab 02/20/22 1431 02/20/22 1630 02/20/22 2303 02/21/22 0403  02/22/22 0523  PROCALCITON  --   --  0.14 0.22 0.28  WBC 20.2*  --   --  20.2* 16.0*  LATICACIDVEN 2.8* 2.7* 1.8  --   --      Liver Function Tests: Recent Labs  Lab 02/20/22 1431  AST 17  ALT 11  ALKPHOS 65  BILITOT 0.9  PROT 5.0*  ALBUMIN 2.0*    No results for input(s): "LIPASE", "AMYLASE" in the last 168 hours. No results for input(s): "AMMONIA" in the last 168 hours.  ABG No results found for: "PHART", "PCO2ART", "PO2ART", "HCO3", "TCO2", "ACIDBASEDEF", "O2SAT"   Coagulation Profile: Recent Labs  Lab 02/20/22 2303  INR 3.3*     Cardiac Enzymes: Recent Labs  Lab 02/20/22 1547  CKTOTAL 27*     HbA1C: No results found for: "HGBA1C"  CBG: Recent Labs  Lab 02/21/22 1955  GLUCAP 207*    Past Medical History:  He,  has a past medical history of Autonomic dysreflexia, Hypertension, Quadriplegia following spinal cord injury (1981), and Spinal cord injury at C5-C7 level without injury of spinal bone (HCC).   Surgical History:   Past Surgical History:  Procedure Laterality Date   ANKLE SURGERY     BACK SURGERY     COLONOSCOPY     HEMORRHOID SURGERY  2008   PERIPHERAL VASCULAR THROMBECTOMY Right 11/24/2018   Procedure: PERIPHERAL VASCULAR THROMBECTOMY;  Surgeon: Annice Needy, MD;  Location: [facility] INVASIVE CV LAB;  Service: Cardiovascular;  Laterality: Right;   SPINE SURGERY       Social History:   reports that he has never smoked. He quit smokeless tobacco use about 35 years ago.  His smokeless tobacco use included chew. He reports current alcohol use of about 1.0 standard drink of alcohol per week. He reports that he does not use drugs.   Family History:  His family history includes Breast cancer (age of onset: 27) in his mother.   Allergies Allergies  Allergen Reactions   Contrast Media [Iodinated Contrast Media] Nausea And Vomiting   Iohexol Nausea And Vomiting     Home Medications  Prior to Admission medications   Medication Sig Start Date  End Date Taking? Authorizing Provider  allopurinol (ZYLOPRIM) 100 MG tablet Take 100 mg by mouth daily.   Yes [provider]  apixaban (ELIQUIS) 5 MG TABS tablet Take 1 tablet (5 mg total) by mouth 2 (two) times daily. 02/13/22  Yes Georgiana Spinner, NP  diazepam (VALIUM) 5 MG tablet Take 5 mg by mouth every 6 (six) hours as needed for anxiety.   Yes [provider]  diltiazem (CARDIZEM CD) 180 MG 24 hr capsule Take 1 capsule (180 mg total) by mouth daily. 12/14/21  Yes Burnadette Pop, MD  levofloxacin (LEVAQUIN) 500 MG tablet Take 500 mg by mouth daily.   Yes [provider]  metroNIDAZOLE (FLAGYL) 500 MG tablet Take 500 mg by  mouth 3 (three) times daily.   Yes [provider]  morphine (MS CONTIN) 60 MG 12 hr tablet TAKE 2 TABLETS BY MOUTH TWICE A DAY FOR PAIN 11/03/18  Yes [provider]  Oxycodone HCl 10 MG TABS Take 10 mg by mouth 2 (two) times daily.   Yes [provider]  oxyCODONE (ROXICODONE) 15 MG immediate release tablet TAKE 1 TABLET BY MOUTH UP TO 3 TIMES DAILY FOR BREAKTHROUGH PAIN Patient not taking: Reported on 02/20/2022 12/04/17   [provider]     Critical care time: 36 minutes

## 2022-02-23 DIAGNOSIS — I4891 Unspecified atrial fibrillation: Secondary | ICD-10-CM

## 2022-02-23 DIAGNOSIS — A419 Sepsis, unspecified organism: Secondary | ICD-10-CM | POA: Diagnosis not present

## 2022-02-23 DIAGNOSIS — M869 Osteomyelitis, unspecified: Secondary | ICD-10-CM

## 2022-02-23 DIAGNOSIS — R7881 Bacteremia: Secondary | ICD-10-CM | POA: Diagnosis not present

## 2022-02-23 DIAGNOSIS — A4901 Methicillin susceptible Staphylococcus aureus infection, unspecified site: Secondary | ICD-10-CM

## 2022-02-23 DIAGNOSIS — R6521 Severe sepsis with septic shock: Secondary | ICD-10-CM

## 2022-02-23 DIAGNOSIS — L89312 Pressure ulcer of right buttock, stage 2: Secondary | ICD-10-CM | POA: Diagnosis not present

## 2022-02-23 DIAGNOSIS — B9561 Methicillin susceptible Staphylococcus aureus infection as the cause of diseases classified elsewhere: Secondary | ICD-10-CM

## 2022-02-23 DIAGNOSIS — I4821 Permanent atrial fibrillation: Secondary | ICD-10-CM | POA: Diagnosis not present

## 2022-02-23 LAB — CULTURE, BLOOD (ROUTINE X 2)

## 2022-02-23 LAB — RENAL FUNCTION PANEL
Albumin: 1.8 g/dL — ABNORMAL LOW (ref 3.5–5.0)
Anion gap: 6 (ref 5–15)
BUN: 22 mg/dL (ref 8–23)
CO2: 28 mmol/L (ref 22–32)
Calcium: 7.4 mg/dL — ABNORMAL LOW (ref 8.9–10.3)
Chloride: 107 mmol/L (ref 98–111)
Creatinine, Ser: 0.61 mg/dL (ref 0.61–1.24)
GFR, Estimated: >60 mL/min (ref >60)
Glucose, Bld: 155 mg/dL — ABNORMAL HIGH (ref 70–99)
Phosphorus: 2.5 mg/dL (ref 2.5–4.6)
Potassium: 3.7 mmol/L (ref 3.5–5.1)
Sodium: 141 mmol/L (ref 135–145)

## 2022-02-23 LAB — CBC
HCT: 33.9 % — ABNORMAL LOW (ref 39.0–52.0)
Hemoglobin: 11 g/dL — ABNORMAL LOW (ref 13.0–17.0)
MCH: 32.1 pg (ref 26.0–34.0)
MCHC: 32.4 g/dL (ref 30.0–36.0)
MCV: 98.8 fL (ref 80.0–100.0)
Platelets: 304 10*3/uL (ref 150–400)
RBC: 3.43 MIL/uL — ABNORMAL LOW (ref 4.22–5.81)
RDW: 15 % (ref 11.5–15.5)
WBC: 16.1 10*3/uL — ABNORMAL HIGH (ref 4.0–10.5)
nRBC: 0.2 % (ref 0.0–0.2)

## 2022-02-23 LAB — MAGNESIUM: Magnesium: 1.8 mg/dL (ref 1.7–2.4)

## 2022-02-23 LAB — HEPARIN LEVEL (UNFRACTIONATED): Heparin Unfractionated: 0.85 IU/mL — ABNORMAL HIGH (ref 0.30–0.70)

## 2022-02-23 LAB — APTT: aPTT: 81 seconds — ABNORMAL HIGH (ref 24–36)

## 2022-02-23 MED ORDER — MAGNESIUM SULFATE 2 GM/50ML IV SOLN
2.0000 g | Freq: Once | INTRAVENOUS | Status: AC
  Administered 2022-02-23: 2 g via INTRAVENOUS
  Filled 2022-02-23: qty 50

## 2022-02-23 MED ORDER — MUPIROCIN 2 % EX OINT
TOPICAL_OINTMENT | Freq: Two times a day (BID) | CUTANEOUS | Status: DC
  Filled 2022-02-23 (×6): qty 22

## 2022-02-23 MED ORDER — METOPROLOL TARTRATE 5 MG/5ML IV SOLN
5.0000 mg | Freq: Four times a day (QID) | INTRAVENOUS | Status: DC | PRN
  Administered 2022-02-23: 5 mg via INTRAVENOUS
  Filled 2022-02-23: qty 5

## 2022-02-23 MED ORDER — POTASSIUM CHLORIDE 20 MEQ PO PACK
40.0000 meq | PACK | Freq: Once | ORAL | Status: AC
  Administered 2022-02-23: 40 meq via ORAL
  Filled 2022-02-23: qty 2

## 2022-02-23 MED ORDER — DILTIAZEM HCL ER COATED BEADS 180 MG PO CP24
180.0000 mg | ORAL_CAPSULE | Freq: Every day | ORAL | Status: DC
  Administered 2022-02-23 – 2022-02-26 (×4): 180 mg via ORAL
  Filled 2022-02-23 (×4): qty 1

## 2022-02-23 MED ORDER — METOPROLOL TARTRATE 5 MG/5ML IV SOLN
5.0000 mg | Freq: Four times a day (QID) | INTRAVENOUS | Status: DC
  Administered 2022-02-23 – 2022-02-24 (×3): 5 mg via INTRAVENOUS
  Filled 2022-02-23 (×3): qty 5

## 2022-02-23 MED ORDER — METOPROLOL TARTRATE 5 MG/5ML IV SOLN
5.0000 mg | Freq: Once | INTRAVENOUS | Status: AC
  Administered 2022-02-23: 5 mg via INTRAVENOUS
  Filled 2022-02-23: qty 5

## 2022-02-23 NOTE — Progress Notes (Addendum)
[facility]  - [place] [facility]   PATIENT NAME: Jesse Whitney    MR#:  [id number]  DATE OF BIRTH:  Aug 15, 1955  SUBJECTIVE:  patient transferred out of ICU service. Admitted with an SSA sepsis due to infection of the wound on his buttocks. Patient getting IV antibiotic. Patient currently also on IV heparin and amiodarone drip due to acute on chronic a fib with RVR. He has functional quadriplegia slid from his seat while transferring in the car. Has right Tibial plateau fracture. Wife at bedside in the ICU    VITALS:  Blood pressure 119/87, pulse (!) 103, temperature 98.6 F (37 C), temperature source Oral, resp. rate 17, weight 68 kg, SpO2 97 %.  PHYSICAL EXAMINATION:   GENERAL:  66 y.o.-year-old patient lying in the bed with no acute distress. Chronically ill LUNGS: Normal breath sounds bilaterally, no wheezing CARDIOVASCULAR: S1, S2 normal. Irregularly irregular ABDOMEN: Soft, nontender, nondistended. Bowel sounds present.  EXTREMITIES:      NEUROLOGIC: nonfocal  patient is alert and awake SKIN:  Pressure Injury 02/21/22 Ankle Anterior;Right abrasion (Active)  02/21/22 0346  Location: Ankle  Location Orientation: Anterior;Right  Staging:   Wound Description (Comments): abrasion  Present on Admission: Yes     Pressure Injury 02/21/22 Ischial tuberosity Right pressure injury; open (Active)  02/21/22 0346  Location: Ischial tuberosity  Location Orientation: Right  Staging:   Wound Description (Comments): pressure injury; open  Present on Admission: Yes      LABORATORY PANEL:  CBC Recent Labs  Lab 02/23/22 0524  WBC 16.1*  HGB 11.0*  HCT 33.9*  PLT 304    Chemistries  Recent Labs  Lab 02/20/22 1431 02/20/22 2303 02/23/22 0524  NA 135   < > 141  K 3.4*   < > 3.7  CL 98   < > 107  CO2 31   < > 28  GLUCOSE 130*   < > 155*  BUN 21   < > 22  CREATININE 0.88   < > 0.61  CALCIUM 7.7*   < > 7.4*  MG  --    < > 1.8  AST 17  --    --   ALT 11  --   --   ALKPHOS 65  --   --   BILITOT 0.9  --   --    < > = values in this interval not displayed.   Cardiac Enzymes No results for input(s): "TROPONINI" in the last 168 hours. RADIOLOGY:  ECHOCARDIOGRAM COMPLETE  Result Date: 02/22/2022    ECHOCARDIOGRAM REPORT   Patient Name:   ABRIAN HANOVER Date of Exam: 02/22/2022 Medical Rec #:  [id number]         Height:       68.0 in Accession #:    [id number]        Weight:       150.0 lb Date of Birth:  1956-03-18         BSA:          1.809 m Patient Age:    65 years          BP:           120/92 mmHg Patient Gender: M                 HR:           121 bpm. Exam Location:  [facility] Procedure: 2D Echo, Color Doppler and Cardiac Doppler Indications:  R78.81 Bacteremia  History:         Patient has prior history of Echocardiogram examinations, most                  recent 12/11/2021. Risk Factors:Hypertension.  Sonographer:     Humphrey RollsJoan Heiss Referring Phys:  [id number] [facility]HAR Diagnosing Phys: Arnoldo HookerBruce Kowalski MD  Sonographer Comments: Suboptimal subcostal window. IMPRESSIONS  1. Left ventricular ejection fraction, by estimation, is 35 to 40%. The left ventricle has moderately decreased function. The left ventricle demonstrates global hypokinesis. Left ventricular diastolic parameters were normal.  2. Right ventricular systolic function is normal. The right ventricular size is normal.  3. Left atrial size was moderately dilated.  4. Right atrial size was moderately dilated.  5. Moderate pleural effusion in the left lateral region.  6. The mitral valve is normal in structure. Moderate mitral valve regurgitation.  7. Tricuspid valve regurgitation is moderate.  8. The aortic valve is normal in structure. Aortic valve regurgitation is trivial. FINDINGS  Left Ventricle: Left ventricular ejection fraction, by estimation, is 35 to 40%. The left ventricle has moderately decreased function. The left ventricle demonstrates global hypokinesis. The left  ventricular internal cavity size was normal in size. There is no left ventricular hypertrophy. Left ventricular diastolic parameters were normal. Right Ventricle: The right ventricular size is normal. No increase in right ventricular wall thickness. Right ventricular systolic function is normal. Left Atrium: Left atrial size was moderately dilated. Right Atrium: Right atrial size was moderately dilated. Pericardium: There is no evidence of pericardial effusion. Mitral Valve: The mitral valve is normal in structure. Moderate mitral valve regurgitation. Tricuspid Valve: The tricuspid valve is normal in structure. Tricuspid valve regurgitation is moderate. Aortic Valve: The aortic valve is normal in structure. Aortic valve regurgitation is trivial. Aortic valve mean gradient measures 1.0 mmHg. Aortic valve peak gradient measures 2.7 mmHg. Aortic valve area, by VTI measures 3.47 cm. Pulmonic Valve: The pulmonic valve was normal in structure. Pulmonic valve regurgitation is trivial. Aorta: The aortic root and ascending aorta are structurally normal, with no evidence of dilitation. IAS/Shunts: No atrial level shunt detected by color flow Doppler. Additional Comments: There is a moderate pleural effusion in the left lateral region.  LEFT VENTRICLE PLAX 2D LVIDd:         3.40 cm   Diastology LVIDs:         2.90 cm   LV e' lateral:   16.00 cm/s LV PW:         1.70 cm   LV E/e' lateral: 1.8 LV IVS:        1.30 cm LVOT diam:     2.20 cm LV SV:         40 LV SV Index:   22 LVOT Area:     3.80 cm  RIGHT VENTRICLE RV Basal diam:  2.70 cm RV S prime:     11.70 cm/s LEFT ATRIUM             Index        RIGHT ATRIUM           Index LA diam:        4.10 cm 2.27 cm/m   RA Area:     16.30 cm LA Vol (A2C):   31.2 ml 17.25 ml/m  RA Volume:   36.10 ml  19.96 ml/m LA Vol (A4C):   58.6 ml 32.40 ml/m LA Biplane Vol: 45.8 ml 25.32 ml/m  AORTIC VALVE  PULMONIC VALVE AV Area (Vmax):    3.11 cm     PV Vmax:       0.78  m/s AV Area (Vmean):   3.60 cm     PV Vmean:      55.100 cm/s AV Area (VTI):     3.47 cm     PV VTI:        0.105 m AV Vmax:           82.50 cm/s   PV Peak grad:  2.5 mmHg AV Vmean:          54.000 cm/s  PV Mean grad:  1.0 mmHg AV VTI:            0.115 m AV Peak Grad:      2.7 mmHg AV Mean Grad:      1.0 mmHg LVOT Vmax:         67.50 cm/s LVOT Vmean:        51.100 cm/s LVOT VTI:          0.105 m LVOT/AV VTI ratio: 0.91  AORTA Ao Root diam: 4.00 cm MITRAL VALVE               TRICUSPID VALVE MV Area (PHT): 6.27 cm    TR Peak grad:   24.2 mmHg MV Decel Time: 121 msec    TR Vmax:        246.00 cm/s MV E velocity: 28.80 cm/s MV A velocity: 71.60 cm/s  SHUNTS MV E/A ratio:  0.40        Systemic VTI:  0.10 m                            Systemic Diam: 2.20 cm Arnoldo Hooker MD Electronically signed by Arnoldo Hooker MD Signature Date/Time: 02/22/2022/5:29:56 PM    Final     Assessment and Plan Mr. Gwenevere Abbot is a 66 year old male with spinal cord injury, quadriplegia, autonomic dysrelfexia, neurogenic bladder self cath, Afib RVR recent hospitalization. Pt fell out of his car when he was trying to get out and hurt his rt leg- a few days later it was swollen and discolored  Sepsis-MSSA Right decubitus ulcer of right buttock -- now off vasopressor -- blood culture positive for staph aureus -- appreciate ID consultation continue IV cefazolin. Recommends TEE. -- Cardiology consultation for TEE placed Dr. Gwen Pounds aware  Right tibial plateau fracture status post recent fall -- patient was seen by Dr. Martha Clan orthopedic -- recommends no surgical intervention. 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 Dr. Martha Clan recommends follow-up in the office in 1 to 2 weeks to see if patient would be a candidate for telescoping knee brace for immobilization -- PRN pain medication  Acute on chronic a fib with RVR in the setting of sepsis -- will resume Cardizem CD 180 mg -- currently on IV amiodarone  drip once heart rate improves switch to oral -- on IV heparin drip (fix eliquis at home) -- will leave IV heparin drip in anticipation for TEE to be done on Monday -- Dr. Gwen Pounds consulted  Spinal cord injury with functional quadriplegia and neurogenic bladder and autonomic dysreflexia -- symptomatic care   Procedures: Family communication : wife at bedside Consults : orthopedic, ID, cardiology CODE STATUS: full DVT Prophylaxis : heparin drip Level of care: Progressive Status is: Inpatient Remains inpatient appropriate because: staph aureus bacteremia needs TEE, weaning of amiodarone    TOTAL TIME TAKING  CARE OF THIS PATIENT: 45 minutes.  >50% time spent on counselling and coordination of care  Note: This dictation was prepared with Dragon dictation along with smaller phrase technology. Any transcriptional errors that result from this process are unintentional.  Enedina Finner M.D    [facility]   CC: Primary care physician; Lonie Peak, PA-C

## 2022-02-23 NOTE — Consult Note (Signed)
PHARMACY CONSULT NOTE  Pharmacy Consult for Electrolyte Monitoring and Replacement   Recent Labs: Potassium (mmol/L)  Date Value  02/23/2022 3.7   Magnesium (mg/dL)  Date Value  01/30/2810 1.8   Calcium (mg/dL)  Date Value  88/67/7373 7.4 (L)   Albumin (g/dL)  Date Value  66/81/5947 1.8 (L)   Phosphorus (mg/dL)  Date Value  07/61/5183 2.5   Sodium (mmol/L)  Date Value  02/23/2022 141     Assessment: 66yo M w/ h/o functional quadriplegia, h/o DVT, syringomyelia, Aflutter, peripheral neuropathy, BPH, DJD of shoulder, & neurogenic bladder presenting for evaluation of  Leukocytosis from PCP w/u for RLE c/f cellulitis. Pharmacy consulted for electrolyte mgmt.  Goal of Therapy:  Potassium 4.0 - 5.1 mmol/L Magnesium 2.0 - 2.4 mg/dL All Other Electrolytes WNL   Plan:  UOP 0.3-0.28ml/k/h;  K 3.4>3.7: from IV KPHos on 11/23 Will replete with PO x1 Mg 2.2>1.8: n/a Will replete with MgSO 2g IV x1 Phos 2.1>2.5: after 15 mmol IV Kphos x1 per NP on 11/23 No further repletion at this time. Recheck electrolytes in am.   Martyn Malay ,PharmD Clinical Pharmacist 02/23/2022 8:18 AM
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
## 2022-02-23 NOTE — TOC Initial Note (Signed)
Transition of Care [facility]) - Initial/Assessment Note    Patient Details  Name: Jesse Whitney MRN: [id number] Date of Birth: June 24, 1955  Transition of Care [facility]) CM/SW Contact:    Shelbie Hutching, RN Phone Number: 02/23/2022, 3:23 PM  Clinical Narrative:                 Patient admitted to the hospital sepsis and decubitus ulcer.  Patient has a history of quadriplegia.  RNCM met with patient and his wife at the bedside.  Per wife patient is very independent, he self caths, does his own transfers to his wheelchair, and even drives.  They were in the process of getting a home health nurse to come in for wound care but it has not started yet.  She would like to get PT and OT to work with patient while he is here as the patient is used to being very active.   TOC will follow.  Reached out to [facility] with [facility], she cannot see where patient has any services with home health - she will check to see if they can accept patient.    Expected Discharge Plan: Home/Self Care Barriers to Discharge: Continued Medical Work up   Patient Goals and CMS Choice Patient states their goals for this hospitalization and ongoing recovery are:: to feel better and get back home, get back to baseline doing things for himself CMS Medicare.gov Compare Post Acute Care list provided to:: Patient Choice offered to / list presented to : Patient  Expected Discharge Plan and Services Expected Discharge Plan: Home/Self Care   Discharge Planning Services: CM Consult Post Acute Care Choice: [place] arrangements for the past 2 months: Single Family Home                           HH Arranged: RN, PT, OT          Prior Living Arrangements/Services Living arrangements for the past 2 months: Single Family Home Lives with:: Spouse Patient language and need for interpreter reviewed:: Yes Do you feel safe going back to the place where you live?: Yes      Need for Family Participation in Patient Care: Yes  (Comment) Care giver support system in place?: Yes (comment) Current home services: DME ([employer] weelchair, slide boards) Criminal Activity/Legal Involvement Pertinent to Current Situation/Hospitalization: No - Comment as needed  Activities of Daily Living      Permission Sought/Granted Permission sought to share information with : Case Freight forwarder, Customer service manager, Family Supports Permission granted to share information with : Yes, Verbal Permission Granted  Share Information with NAME: Jesse Whitney  Permission granted to share info w AGENCY: [place] granted to share info w Relationship: spouse  Permission granted to share info w Contact Information: [phone]  Emotional Assessment Appearance:: Appears stated age Attitude/Demeanor/Rapport: Engaged Affect (typically observed): Accepting   Alcohol / Substance Use: Not Applicable Psych Involvement: No (comment)  Admission diagnosis:  Septic shock ([place]) [A41.9, R65.21] Hypotension, unspecified hypotension type [I95.9] Osteomyelitis, unspecified site, unspecified type [facility]) [M86.9] Patient Active Problem List   Diagnosis Date Noted   Osteomyelitis ([place]) 02/23/2022   Permanent atrial fibrillation ([place]) 02/23/2022   MSSA (methicillin susceptible Staphylococcus aureus) 02/23/2022   Decubitus ulcer of ischial area, right, stage II ([place]) 02/21/2022   Septic shock ([place]) 02/20/2022   Sepsis due to gram-negative UTI ([place]) 12/11/2021   Atrial flutter with rapid ventricular response ([place]) 12/11/2021  Peripheral neuropathy 12/11/2021   BPH (benign prostatic hyperplasia) 12/11/2021   Pressure injury of skin 12/11/2021   Severe sepsis (HCC)    DVT (deep venous thrombosis) ([employer]) 11/23/2018   Chronic shoulder pain 10/02/2016   Syringomyelia ([place]) 10/02/2016   Long term current use of opiate analgesic 09/13/2016   Long term prescription opiate use 09/13/2016   Opiate use 09/13/2016   Chronic pain  syndrome 09/13/2016   History of hemorrhoids 08/15/2016   Atonic neurogenic bladder 06/17/2016   Quadriplegia ([place]) 06/17/2016   Cervical spinal cord injury ([place]) 02/10/2015   Functional quadriplegia ([place]) 02/10/2015   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 [place] [address] Phone: [phone] Fax: [phone]     Social Determinants of Health (SDOH) Interventions    Readmission Risk Interventions     No data to display
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
## 2022-02-24 DIAGNOSIS — L89312 Pressure ulcer of right buttock, stage 2: Secondary | ICD-10-CM | POA: Diagnosis not present

## 2022-02-24 DIAGNOSIS — B9561 Methicillin susceptible Staphylococcus aureus infection as the cause of diseases classified elsewhere: Secondary | ICD-10-CM

## 2022-02-24 DIAGNOSIS — M869 Osteomyelitis, unspecified: Secondary | ICD-10-CM | POA: Diagnosis not present

## 2022-02-24 DIAGNOSIS — R7881 Bacteremia: Secondary | ICD-10-CM | POA: Diagnosis not present

## 2022-02-24 DIAGNOSIS — I4821 Permanent atrial fibrillation: Secondary | ICD-10-CM | POA: Diagnosis not present

## 2022-02-24 LAB — CULTURE, BLOOD (ROUTINE X 2)

## 2022-02-24 LAB — BASIC METABOLIC PANEL
Anion gap: 10 (ref 5–15)
BUN: 22 mg/dL (ref 8–23)
CO2: 23 mmol/L (ref 22–32)
Calcium: 8.1 mg/dL — ABNORMAL LOW (ref 8.9–10.3)
Chloride: 112 mmol/L — ABNORMAL HIGH (ref 98–111)
Creatinine, Ser: 0.87 mg/dL (ref 0.61–1.24)
GFR, Estimated: >60 mL/min (ref >60)
Glucose, Bld: 169 mg/dL — ABNORMAL HIGH (ref 70–99)
Potassium: 4.3 mmol/L (ref 3.5–5.1)
Sodium: 145 mmol/L (ref 135–145)

## 2022-02-24 LAB — CBC
HCT: 39.7 % (ref 39.0–52.0)
Hemoglobin: 13 g/dL (ref 13.0–17.0)
MCH: 32.3 pg (ref 26.0–34.0)
MCHC: 32.7 g/dL (ref 30.0–36.0)
MCV: 98.8 fL (ref 80.0–100.0)
Platelets: 375 10*3/uL (ref 150–400)
RBC: 4.02 MIL/uL — ABNORMAL LOW (ref 4.22–5.81)
RDW: 15.6 % — ABNORMAL HIGH (ref 11.5–15.5)
WBC: 24.7 10*3/uL — ABNORMAL HIGH (ref 4.0–10.5)
nRBC: 0.1 % (ref 0.0–0.2)

## 2022-02-24 LAB — APTT: aPTT: 63 seconds — ABNORMAL HIGH (ref 24–36)

## 2022-02-24 LAB — HEPARIN LEVEL (UNFRACTIONATED): Heparin Unfractionated: 0.82 IU/mL — ABNORMAL HIGH (ref 0.30–0.70)

## 2022-02-24 LAB — MAGNESIUM: Magnesium: 2.3 mg/dL (ref 1.7–2.4)

## 2022-02-24 LAB — PHOSPHORUS: Phosphorus: 2.7 mg/dL (ref 2.5–4.6)

## 2022-02-24 MED ORDER — METOPROLOL TARTRATE 25 MG PO TABS
12.5000 mg | ORAL_TABLET | Freq: Two times a day (BID) | ORAL | Status: DC
  Administered 2022-02-24 – 2022-02-27 (×8): 12.5 mg via ORAL
  Filled 2022-02-24 (×8): qty 1

## 2022-02-24 MED ORDER — AMIODARONE HCL 200 MG PO TABS
200.0000 mg | ORAL_TABLET | Freq: Two times a day (BID) | ORAL | Status: DC
  Administered 2022-02-24 – 2022-02-27 (×7): 200 mg via ORAL
  Filled 2022-02-24 (×7): qty 1
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
## 2022-02-24 NOTE — Progress Notes (Signed)
[facility] Cardiology [facility] Encounter Note  Patient: Jesse Whitney / Admit Date: 02/20/2022 / Date of Encounter: 02/24/2022, 5:52 AM   Subjective: Patient has been resting more comfortably today and overnight after appropriate medication management and antibiotics for bacteremia.  Further evaluation by transesophageal echocardiogram likely Monday.  The patient does have atrial fibrillation with rapid ventricular rate reasonably controlled at this time with diltiazem and amiodarone combination.  Review of Systems: Positive for: Shortness of breath Negative for: Vision change, hearing change, syncope, dizziness, nausea, vomiting,diarrhea, bloody stool, stomach pain, cough, congestion, diaphoresis, urinary frequency, urinary pain,skin lesions, skin rashes Others previously listed  Objective: Telemetry: Atrial fibrillation rapid ventricular rate Physical Exam: Blood pressure (!) 122/96, pulse (!) 113, temperature 98.2 F (36.8 C), temperature source Oral, resp. rate 20, weight 68 kg, SpO2 90 %. Body mass index is 22.81 kg/m. General: Well developed, well nourished, in no acute distress. Head: Normocephalic, atraumatic, sclera non-icteric, no xanthomas, nares are without discharge. Neck: No apparent masses Lungs: Normal respirations with no wheezes, no rhonchi, no rales , no crackles   Heart: Irregular rate and rhythm, normal S1 S2, no murmur, no rub, no gallop, PMI is normal size and placement, carotid upstroke normal without bruit, jugular venous pressure normal Abdomen: Soft, non-tender, non-distended with normoactive bowel sounds. No hepatosplenomegaly. Abdominal aorta is normal size without bruit Extremities: No edema, no clubbing, no cyanosis, no ulcers,  Peripheral: 2+ radial, 2+ femoral, 2+ dorsal pedal pulses Neuro: Alert and oriented.  Patient paraplegic Psych:  Responds to questions appropriately with a normal affect.   Intake/Output Summary (Last 24 hours) at  02/24/2022 0552 Last data filed at 02/24/2022 0517 Gross per 24 hour  Intake 1427.83 ml  Output --  Net 1427.83 ml    Inpatient Medications:   allopurinol  100 mg Oral Daily   vitamin C  500 mg Oral BID   Chlorhexidine Gluconate Cloth  6 each Topical Daily   diazepam  5 mg Oral Q6H   diltiazem  180 mg Oral Daily   feeding supplement  237 mL Oral TID BM   leptospermum manuka honey  1 Application Topical Daily   metoprolol tartrate  5 mg Intravenous Q6H   midodrine  10 mg Oral TID WC   morphine  30 mg Oral Q12H   multivitamin with minerals  1 tablet Oral Daily   mupirocin ointment   Nasal BID   pantoprazole  40 mg Oral Daily   zinc sulfate  220 mg Oral Daily   Infusions:   sodium chloride     amiodarone 30 mg/hr (02/24/22 0517)    ceFAZolin (ANCEF) IV Stopped (02/23/22 2207)   heparin 1,550 Units/hr (02/24/22 0517)   metronidazole Stopped (02/23/22 2236)   norepinephrine (LEVOPHED) Adult infusion Stopped (02/22/22 1901)   sodium chloride      Labs: Recent Labs    02/22/22 0523 02/23/22 0524  NA 139 141  K 3.4* 3.7  CL 106 107  CO2 28 28  GLUCOSE 120* 155*  BUN 23 22  CREATININE 0.76 0.61  CALCIUM 7.5* 7.4*  MG 2.2 1.8  PHOS 2.1* 2.5   Recent Labs    02/23/22 0524  ALBUMIN 1.8*   Recent Labs    02/22/22 0523 02/23/22 0524  WBC 16.0* 16.1*  HGB 11.8* 11.0*  HCT 35.1* 33.9*  MCV 98.0 98.8  PLT 331 304   No results for input(s): "CKTOTAL", "CKMB", "TROPONINI" in the last 72 hours. Invalid input(s): "POCBNP" No results for input(s): "HGBA1C"  in the last 72 hours.   Weights: Filed Weights   02/20/22 1427  Weight: 68 kg     Radiology/Studies:  ECHOCARDIOGRAM COMPLETE  Result Date: 02/22/2022    ECHOCARDIOGRAM REPORT   Patient Name:   Jesse Whitney Date of Exam: 02/22/2022 Medical Rec #:  [id number]         Height:       68.0 in Accession #:    [id number]        Weight:       150.0 lb Date of Birth:  12-10-1955         BSA:          1.809 m  Patient Age:    66 years          BP:           120/92 mmHg Patient Gender: M                 HR:           121 bpm. Exam Location:  [facility] Procedure: 2D Echo, Color Doppler and Cardiac Doppler Indications:     R78.81 Bacteremia  History:         Patient has prior history of Echocardiogram examinations, most                  recent 12/11/2021. Risk Factors:Hypertension.  Sonographer:     Humphrey Rolls Referring Phys:  [id number] [facility] Diagnosing Phys: Arnoldo Hooker MD  Sonographer Comments: Suboptimal subcostal window. IMPRESSIONS  1. Left ventricular ejection fraction, by estimation, is 35 to 40%. The left ventricle has moderately decreased function. The left ventricle demonstrates global hypokinesis. Left ventricular diastolic parameters were normal.  2. Right ventricular systolic function is normal. The right ventricular size is normal.  3. Left atrial size was moderately dilated.  4. Right atrial size was moderately dilated.  5. Moderate pleural effusion in the left lateral region.  6. The mitral valve is normal in structure. Moderate mitral valve regurgitation.  7. Tricuspid valve regurgitation is moderate.  8. The aortic valve is normal in structure. Aortic valve regurgitation is trivial. FINDINGS  Left Ventricle: Left ventricular ejection fraction, by estimation, is 35 to 40%. The left ventricle has moderately decreased function. The left ventricle demonstrates global hypokinesis. The left ventricular internal cavity size was normal in size. There is no left ventricular hypertrophy. Left ventricular diastolic parameters were normal. Right Ventricle: The right ventricular size is normal. No increase in right ventricular wall thickness. Right ventricular systolic function is normal. Left Atrium: Left atrial size was moderately dilated. Right Atrium: Right atrial size was moderately dilated. Pericardium: There is no evidence of pericardial effusion. Mitral Valve: The mitral valve is normal in structure.  Moderate mitral valve regurgitation. Tricuspid Valve: The tricuspid valve is normal in structure. Tricuspid valve regurgitation is moderate. Aortic Valve: The aortic valve is normal in structure. Aortic valve regurgitation is trivial. Aortic valve mean gradient measures 1.0 mmHg. Aortic valve peak gradient measures 2.7 mmHg. Aortic valve area, by VTI measures 3.47 cm. Pulmonic Valve: The pulmonic valve was normal in structure. Pulmonic valve regurgitation is trivial. Aorta: The aortic root and ascending aorta are structurally normal, with no evidence of dilitation. IAS/Shunts: No atrial level shunt detected by color flow Doppler. Additional Comments: There is a moderate pleural effusion in the left lateral region.  LEFT VENTRICLE PLAX 2D LVIDd:         3.40 cm   Diastology LVIDs:  2.90 cm   LV e' lateral:   16.00 cm/s LV PW:         1.70 cm   LV E/e' lateral: 1.8 LV IVS:        1.30 cm LVOT diam:     2.20 cm LV SV:         40 LV SV Index:   22 LVOT Area:     3.80 cm  RIGHT VENTRICLE RV Basal diam:  2.70 cm RV S prime:     11.70 cm/s LEFT ATRIUM             Index        RIGHT ATRIUM           Index LA diam:        4.10 cm 2.27 cm/m   RA Area:     16.30 cm LA Vol (A2C):   31.2 ml 17.25 ml/m  RA Volume:   36.10 ml  19.96 ml/m LA Vol (A4C):   58.6 ml 32.40 ml/m LA Biplane Vol: 45.8 ml 25.32 ml/m  AORTIC VALVE                    PULMONIC VALVE AV Area (Vmax):    3.11 cm     PV Vmax:       0.78 m/s AV Area (Vmean):   3.60 cm     PV Vmean:      55.100 cm/s AV Area (VTI):     3.47 cm     PV VTI:        0.105 m AV Vmax:           82.50 cm/s   PV Peak grad:  2.5 mmHg AV Vmean:          54.000 cm/s  PV Mean grad:  1.0 mmHg AV VTI:            0.115 m AV Peak Grad:      2.7 mmHg AV Mean Grad:      1.0 mmHg LVOT Vmax:         67.50 cm/s LVOT Vmean:        51.100 cm/s LVOT VTI:          0.105 m LVOT/AV VTI ratio: 0.91  AORTA Ao Root diam: 4.00 cm MITRAL VALVE               TRICUSPID VALVE MV Area (PHT): 6.27 cm     TR Peak grad:   24.2 mmHg MV Decel Time: 121 msec    TR Vmax:        246.00 cm/s MV E velocity: 28.80 cm/s MV A velocity: 71.60 cm/s  SHUNTS MV E/A ratio:  0.40        Systemic VTI:  0.10 m                            Systemic Diam: 2.20 cm Arnoldo Hooker MD Electronically signed by Arnoldo Hooker MD Signature Date/Time: 02/22/2022/5:29:56 PM    Final    CT FEMUR RIGHT WO CONTRAST  Result Date: 02/20/2022 CLINICAL DATA:  Right leg swelling. History of quadriplegia and pressure ulcers. EXAM: CT OF THE LOWER RIGHT EXTREMITY WITHOUT CONTRAST TECHNIQUE: Multidetector CT imaging of the right lower extremity was performed according to the standard protocol. RADIATION DOSE REDUCTION: This exam was performed according to the departmental dose-optimization program which includes automated exposure control, adjustment of the mA and/or  kV according to patient size and/or use of iterative reconstruction technique. COMPARISON:  None Available. FINDINGS: Bones/Joint/Cartilage Subacute appearing comminuted fracture of the right tibial plateau with dissociation of the tibial metaphysis and proximal diaphysis. The diaphysis is posteriorly displaced up to 1.3 cm in mildly impacted up to 1.1 cm minimal early callus formation. Minimal offset of the lateral tibial plateau articular surface of 2-3 mm. Subacute appearing comminuted fracture of the fibular head with minimal early callus formation. No dislocation. Asymmetric sclerosis and periosteal reaction involving the right ischial tuberosity. Mild degenerative changes of the bilateral hip and right knee joints. Large knee hemarthrosis. Ligaments Ligaments are suboptimally evaluated by CT. Muscles and Tendons Diffuse muscle atrophy in the right leg. Soft tissue Significant diffuse soft tissue swelling of the visualized right leg. No discrete fluid collection. Small amount of subcutaneous emphysema in the inferior right buttock tracking towards the ischial tuberosity. No soft  tissue mass. IMPRESSION: 1. Subacute comminuted fracture of the right tibial plateau with dissociation of the tibial metaphysis and proximal diaphysis (Schatzker type 6). 2. Subacute comminuted fracture of the fibular head. 3. 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 Small amount of subcutaneous emphysema in the inferior right buttock tracking towards the ischial tuberosity. Correlate for decubitus ulcer. 4. Large knee hemarthrosis. 5. Significant diffuse soft tissue swelling of the visualized right leg. No abscess. Electronically Signed   By: Obie Dredge M.D.   On: 02/20/2022 17:28   VAS [place] LOWER EXTREMITY VENOUS (DVT)  Result Date: 02/12/2022  Lower Venous DVT Study Patient Name:  Jesse Whitney  Date of Exam:   02/12/2022 Medical Rec #: [id number]          Accession #:    [id number] Date of Birth: Jul 27, 1955          Patient Gender: M Patient Age:   71 years Exam Location:  [facility] Procedure:      VAS [place] LOWER EXTREMITY VENOUS (DVT) Referring Phys: Levora Dredge --------------------------------------------------------------------------------  Indications: Pain. Other Indications: No symptoms at this time , no swelling,. Risk Factors: DVT x 1 month Surgery Right venous thrombectomy and CIV,EIV stent 11/24/18. Comparison Study: 01/06/2019 Performing Technologist: Debbe Bales RVS  Examination Guidelines: A complete evaluation includes B-mode imaging, spectral Doppler, color Doppler, and power Doppler as needed of all accessible portions of each vessel. Bilateral testing is considered an integral part of a complete examination. Limited examinations for reoccurring indications may be performed as noted. The reflux portion of the exam is performed with the patient in reverse Trendelenburg.  +---------+---------------+---------+-----------+----------+--------------+ RIGHT     CompressibilityPhasicitySpontaneityPropertiesThrombus Aging +---------+---------------+---------+-----------+----------+--------------+ CFV      Full           Yes      Yes                                 +---------+---------------+---------+-----------+----------+--------------+ SFJ      Full           Yes      Yes                                 +---------+---------------+---------+-----------+----------+--------------+ FV Prox  Full           Yes      Yes                                 +---------+---------------+---------+-----------+----------+--------------+  FV Mid   Full           Yes      Yes                                 +---------+---------------+---------+-----------+----------+--------------+ FV DistalFull           Yes      Yes                                 +---------+---------------+---------+-----------+----------+--------------+ PFV      Full           Yes      Yes                                 +---------+---------------+---------+-----------+----------+--------------+ POP      Full           Yes      Yes                                 +---------+---------------+---------+-----------+----------+--------------+ PTV      Full           Yes      Yes                                 +---------+---------------+---------+-----------+----------+--------------+ PERO     Full           Yes      Yes                                 +---------+---------------+---------+-----------+----------+--------------+ GSV      Full           Yes      Yes                                 +---------+---------------+---------+-----------+----------+--------------+     Summary: RIGHT: - No evidence of deep vein thrombosis in the lower extremity. No indirect evidence of obstruction proximal to the inguinal ligament. - There is no evidence of chronic venous insufficiency. - There is no evidence of superficial venous thrombosis.   *See table(s) above  for measurements and observations. Electronically signed by Levora DredgeGregory Schnier MD on 02/12/2022 at 3:42:56 PM.    Final      Assessment and Recommendation  66 y.o. male with known significant paraplegia and bacteremia of probable source of ulcers now slightly improved since yesterday and no longer shaking.  Atrial fibrillation with reasonable heart rate control and no evidence of congestive heart failure or myocardial infarction 1.  Continuation supportive care for bacteremia 2.  Atrial fibrillation heart rate control with continued amiodarone use as well as diltiazem and would consider low-dose beta-blocker if necessary for goal resting heart rate below 100 bpm although patient is still slightly ill and would expect some heart rate up to 110 bpm 3.  Further cardiac diagnostics including transesophageal echocardiogram Monday for assessment of endocarditis  Signed, Arnoldo HookerBruce Marilyn Wing M.D. FACC

## 2022-02-24 NOTE — Consult Note (Signed)
PHARMACY CONSULT NOTE  Pharmacy Consult for Electrolyte Monitoring and Replacement   Recent Labs: Potassium (mmol/L)  Date Value  02/24/2022 4.3   Magnesium (mg/dL)  Date Value  56/81/2751 2.3   Calcium (mg/dL)  Date Value  70/04/7492 8.1 (L)   Albumin (g/dL)  Date Value  49/67/5916 1.8 (L)   Phosphorus (mg/dL)  Date Value  38/46/6599 2.7   Sodium (mmol/L)  Date Value  02/24/2022 145     Assessment: 65yo M w/ h/o functional quadriplegia, h/o DVT, syringomyelia, Aflutter, peripheral neuropathy, BPH, DJD of shoulder, & neurogenic bladder presenting for evaluation of  Leukocytosis from PCP w/u for RLE c/f cellulitis. Pharmacy consulted for electrolyte mgmt.  Goal of Therapy:  Potassium 4.0 - 5.1 mmol/L Magnesium 2.0 - 2.4 mg/dL All Other Electrolytes WNL   Plan:  UOP stable at 0.88ml/k/h;  K 3.7>4.3: After KCL PO x1 on 11/24 WNL, no further repletion at this time. Mg 1.8>2.3:after MgSO 2g IV x1 on 11/24 WNL, no further repletion at this time. Phos 2.5>2.7: n/a Remains WNL today, no further repletion at this time. Recheck electrolytes in AM  Martyn Malay ,PharmD Clinical Pharmacist 02/24/2022 8:14 AM
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
## 2022-02-24 NOTE — Progress Notes (Signed)
Bladder scan completed, 275 in bladder, patient states he does feel when he has to empty his bladder and does not feel that way yet. Will continue to monitor.

## 2022-02-24 NOTE — Progress Notes (Signed)
Physical Therapy Evaluation Patient Details Name: Jesse Whitney MRN: [id number] DOB: Jan 29, 1956 Today's Date: 02/24/2022  History of Present Illness  Pt is a 66 y/o male presenting to [facility] ED from home on 02/20/2022 for evaluation of high white blood cell count at the encouragement of the patient's PCP. Pt found to have sepsis with bacteremia. PMH including but not limited to SCI, syringomyelia, a-flutter, peripheral neuropathy, BPH, DJD, neurogenic bladder.    Clinical Impression  Pt presented supine in bed with HOB elevated, initially asleep but easily aroused and willing to participate in therapy session. Prior to admission, pt reported that he was very independent with modifications and medical equipment. He lives with his wife in a single level home with a ramped entrance. Pt stated that he uses a manual wheelchair and is typically able to perform transfers at a mod I level with use of a sliding board. He can propel himself in his w/c and drives. At the time of evaluation, pt significantly limited with mobility and required maximal assistance for bed mobility. Pt would continue to benefit from skilled physical therapy services at this time while admitted and after d/c to address the below listed limitations in order to improve overall safety and independence with functional mobility.  All VSS throughout.     Recommendations for follow up therapy are one component of a multi-disciplinary discharge planning process, led by the attending physician.  Recommendations may be updated based on patient status, additional functional criteria and insurance authorization.  Follow Up Recommendations Skilled nursing-short term rehab (<3 hours/day) Can patient physically be transported by private vehicle: No    Assistance Recommended at Discharge Frequent or constant Supervision/Assistance  Patient can return home with the following  Two people to help with walking and/or transfers;Two people to help  with bathing/dressing/bathroom;Assistance with cooking/housework;Assistance with feeding;Assist for transportation    Equipment Recommendations None recommended by PT  Recommendations for Other Services       Functional Status Assessment Patient has had a recent decline in their functional status and demonstrates the ability to make significant improvements in function in a reasonable and predictable amount of time.     Precautions / Restrictions Precautions Precautions: Fall Precaution Comments: hx of SCI Restrictions Weight Bearing Restrictions: No      Mobility  Bed Mobility Overal bed mobility: Needs Assistance Bed Mobility: Rolling Rolling: Max assist         General bed mobility comments: pt able to roll with use of bilateral UEs on bed rails and maximal assistance from PT. Attempted to long sit in bed; however, pt unable to lift his upper body off of the support of the bed, even with bilateral UE supports and max A from PT    Transfers                   General transfer comment: would require a mechanical lift or total dependent lift at this time    Ambulation/Gait                  Stairs            Wheelchair Mobility    Modified Rankin (Stroke Patients Only)       Balance                                             Pertinent Vitals/Pain  Pain Assessment Pain Assessment: 0-10 Pain Score: 0-No pain    Home Living Family/patient expects to be discharged to:: Private residence Living Arrangements: Spouse/significant other Available Help at Discharge: Family;Available PRN/intermittently (wife works as a Emergency planning/management officer in Scientific laboratory technician) Type of Home: [employer] Home Access: Ramped entrance       Home Layout: One level Home Equipment: Wheelchair - manual;BSC/3in1;Hospital bed;Other (comment) (roll in shower chair) Additional Comments: slide board and roll in shower chair    Prior Function Prior Level of Function :  Independent/Modified Independent;Driving             Mobility Comments: Pt reports transfering himself into car/roll in shower chair/and wheelchair. He propels wheelchair independently at baseline. ADLs Comments: Pt reports self cathing and verbalized performing bowl program and transferring self onto [facility] and roll in shower chair for bathing.     Hand Dominance        Extremity/Trunk Assessment   Upper Extremity Assessment Upper Extremity Assessment: Generalized weakness (hx of SCI)    Lower Extremity Assessment Lower Extremity Assessment: RLE deficits/detail;LLE deficits/detail RLE Deficits / Details: pt with hx of SCI and has no volitional movement or muscle activation throughout his LEs LLE Deficits / Details: pt with hx of SCI and has no volitional movement or muscle activation throughout his LEs       Communication   Communication: Expressive difficulties  Cognition Arousal/Alertness: Lethargic, Suspect due to medications Behavior During Therapy: Flat affect Overall Cognitive Status: Impaired/Different from baseline Area of Impairment: Memory, Attention, Following commands, Safety/judgement, Awareness, Problem solving                   Current Attention Level: Selective Memory: Decreased short-term memory Following Commands: Follows one step commands inconsistently, Follows one step commands with increased time Safety/Judgement: Decreased awareness of deficits Awareness: Emergent Problem Solving: Slow processing, Decreased initiation, Difficulty sequencing, Requires verbal cues, Requires tactile cues          General Comments      Exercises     Assessment/Plan    PT Assessment Patient needs continued PT services  PT Problem List Decreased strength;Decreased range of motion;Decreased activity tolerance;Decreased balance;Decreased mobility;Decreased coordination;Decreased knowledge of use of DME;Decreased safety awareness;Decreased cognition;Decreased  knowledge of precautions       PT Treatment Interventions DME instruction;Functional mobility training;Therapeutic activities;Therapeutic exercise;Balance training;Neuromuscular re-education;Patient/family education    PT Goals (Current goals can be found in the Care Plan section)  Acute Rehab PT Goals Patient Stated Goal: to return to his PLOF PT Goal Formulation: With patient Time For Goal Achievement: Mar 30, 2022 Potential to Achieve Goals: Fair    Frequency Min 2X/week     Co-evaluation               AM-PAC PT "6 Clicks" Mobility  Outcome Measure Help needed turning from your back to your side while in a flat bed without using bedrails?: A Lot Help needed moving from lying on your back to sitting on the side of a flat bed without using bedrails?: A Lot Help needed moving to and from a bed to a chair (including a wheelchair)?: Total Help needed standing up from a chair using your arms (e.g., wheelchair or bedside chair)?: Total Help needed to walk in hospital room?: Total Help needed climbing 3-5 steps with a railing? : Total 6 Click Score: 8    End of Session   Activity Tolerance: Patient limited by lethargy Patient left: in bed;with call bell/phone within reach;with bed alarm set Nurse  Communication: Mobility status PT Visit Diagnosis: Other abnormalities of gait and mobility (R26.89)    Time: 5110-2111 PT Time Calculation (min) (ACUTE ONLY): 15 min   Charges:   PT Evaluation $PT Eval Moderate Complexity: 1 Mod          Ginette Pitman, PT, DPT  Acute Rehabilitation Services Office [phone]   Alessandra Bevels Sam Wunschel 02/24/2022, 11:01 AM

## 2022-02-24 NOTE — TOC Progression Note (Signed)
Transition of Care [facility]) - Progression Note    Patient Details  Name: Jesse Whitney MRN: [id number] Date of Birth: 1955/04/16  [facility] CM/SW Contact  Liliana Cline, LCSW Phone Number: 02/24/2022, 1:46 PM  Clinical Narrative:    Spoke with spouse who is at bedside.  Patient and spouse decline SNF, would like [facility] for wound care as well as PT and OT and prefer [facility].  Spouse states family can provide 24 hour care at home.  CSW called Meg with [facility] who confirmed they can take patient for [facility].    Expected Discharge Plan: Home/Self Care Barriers to Discharge: Continued Medical Work up  Expected Discharge Plan and Services Expected Discharge Plan: Home/Self Care   Discharge Planning Services: CM Consult Post Acute Care Choice: Home Health Living arrangements for the past 2 months: Single Family Home                           HH Arranged: RN, PT, OT           Social Determinants of Health (SDOH) Interventions    Readmission Risk Interventions     No data to display

## 2022-02-24 NOTE — Progress Notes (Signed)
Pharmacy Antibiotic Note  Jesse Whitney is a 66 y.o. male admitted on 02/20/2022 with  osteomyelitis (Non-vertebral) .  Pharmacy has been consulted for Vancomycin, Cefepime dosing.  Plan: 11/22 BCID update -  2 of 4 bottles (same set) positive for Staph aureus - no resistance genes detected. Pt currently on Vancomycin & Cefepime. Per d/w ID will narrow to unasyn alone at this time. 11/23 PM - Unasyn > Ancef/Flagyl per ID. 11/25 -  Repeat blood cultures from 11/23 remain no growth.  Continues Ancef 2g IV q8H Continues metronidazole 500mg  IV q12h  Weight: 68 kg (150 lb)  Temp (24hrs), Avg:98.2 F (36.8 C), Min:98.1 F (36.7 C), Max:98.6 F (37 C)  Recent Labs  Lab 02/20/22 1431 02/20/22 1630 02/20/22 2303 02/21/22 0403 02/22/22 0523 02/23/22 0524 02/24/22 0555  WBC 20.2*  --   --  20.2* 16.0* 16.1* 24.7*  CREATININE 0.88  --   --  0.63 0.76 0.61 0.87  LATICACIDVEN 2.8* 2.7* 1.8  --   --   --   --      Estimated Creatinine Clearance: 81.4 mL/min (by C-G formula based on SCr of 0.87 mg/dL).    Allergies  Allergen Reactions   Contrast Media [Iodinated Contrast Media] Nausea And Vomiting   Iohexol Nausea And Vomiting    Antimicrobials this admission: + VAN/CFP/MTZ x1 (11/21);  then +Unasyn (11/22>11/23);  then > Ancef/MTZ 11/23>>   Dose adjustments this admission: CTM and adjust PRN. Renal fxn recovered significantly.  Microbiology results: 11/21 BCx: BCID 2of4 bottles (same set) with Staph aureus - no resistance genes detected. >> Staph Aureus (Res: Cipro, erythro, clinda; otherwise susc) 11/21 MRSA PCR: Positive 11/23 Bcx: NGTD 11/24 Wound (Iliac Right) - NGTD   Thank you for allowing pharmacy to be a part of this patient's care.  12/24 Jesse Whitney 02/24/2022 1:30 PM

## 2022-02-24 NOTE — Evaluation (Signed)
Occupational Therapy Evaluation Patient Details Name: Jesse Whitney MRN: [id number] DOB: 1955/11/10 Today's Date: 02/24/2022   History of Present Illness 66yo M w/ h/o functional quadriplegia, h/o DVT, syringomyelia, Aflutter, peripheral neuropathy, BPH, DJD of shoulder, & neurogenic bladder presenting for evaluation of   Leukocytosis from PCP w/u for RLE c/f cellulitis.   Clinical Impression   Patient presenting with decreased Ind in self care,balance, functional mobility/transfers, endurance, and safety awareness. Patient reports living at home with wife and being mod I with use of equipment and modifications for self care needs and functional mobility with use of manual wheelchair. Pt is able to transfer himself, perform self cath, and perform bowl program himself at baseline. Wife does work outside of the home and he is alone for several hours.  Patient currently functioning at heavy max- total A for bed mobility. Max a for static sitting balance and pt declined attempts transfer to recliner. Recommend use of hoyer for transfer to recliner chair for safety. Pt is far from baseline and does not appear to have 24/7 assistance at home.  Patient will benefit from acute OT to increase overall independence in the areas of ADLs, functional mobility, and safety awareness in order to safely discharge to next venue of care.      Recommendations for follow up therapy are one component of a multi-disciplinary discharge planning process, led by the attending physician.  Recommendations may be updated based on patient status, additional functional criteria and insurance authorization.   Follow Up Recommendations  Skilled nursing-short term rehab (<3 hours/day)     Assistance Recommended at Discharge Frequent or constant Supervision/Assistance  Patient can return home with the following Two people to help with walking and/or transfers;A lot of help with bathing/dressing/bathroom;Assist for  transportation;Help with stairs or ramp for entrance    Functional Status Assessment  Patient has had a recent decline in their functional status and demonstrates the ability to make significant improvements in function in a reasonable and predictable amount of time.  Equipment Recommendations  Other (comment) (defer to next venue of care)       Precautions / Restrictions Precautions Precautions: Fall Precaution Comments: hx of SCI Restrictions Weight Bearing Restrictions: No      Mobility Bed Mobility Overal bed mobility: Needs Assistance Bed Mobility: Rolling, Supine to Sit, Sit to Supine Rolling: Max assist   Supine to sit: Total assist Sit to supine: Total assist        Transfers                   General transfer comment: would require a mechanical lift or total dependent lift at this time      Balance Overall balance assessment: Needs assistance Sitting-balance support: Feet supported, Bilateral upper extremity supported Sitting balance-Leahy Scale: Poor Sitting balance - Comments: mod- max static sitting balance                                   ADL either performed or assessed with clinical judgement   ADL Overall ADL's : Needs assistance/impaired                                       General ADL Comments: Anticipate min A for UB self care from bed level but max-total A for LB self care from bed level. Pt  declined functional transfer this session and would likely need +2 assistance with recommendation for hoyer lift at this time.     Vision Patient Visual Report: No change from baseline              Pertinent Vitals/Pain Pain Assessment Pain Assessment: No/denies pain     Hand Dominance     Extremity/Trunk Assessment Upper Extremity Assessment Upper Extremity Assessment: Generalized weakness (hx of SCI)   Lower Extremity Assessment Lower Extremity Assessment: RLE deficits/detail;LLE deficits/detail RLE  Deficits / Details: pt with hx of SCI and has no volitional movement or muscle activation throughout his LEs LLE Deficits / Details: pt with hx of SCI and has no volitional movement or muscle activation throughout his LEs       Communication Communication Communication: Expressive difficulties   Cognition Arousal/Alertness: Suspect due to medications, Lethargic Behavior During Therapy: Flat affect Overall Cognitive Status: Impaired/Different from baseline Area of Impairment: Memory, Attention, Following commands, Safety/judgement, Awareness, Problem solving                   Current Attention Level: Selective Memory: Decreased short-term memory Following Commands: Follows one step commands inconsistently, Follows one step commands with increased time Safety/Judgement: Decreased awareness of deficits Awareness: Emergent Problem Solving: Slow processing, Decreased initiation, Difficulty sequencing, Requires verbal cues, Requires tactile cues                  Home Living Family/patient expects to be discharged to:: Private residence Living Arrangements: Spouse/significant other Available Help at Discharge: Family;Available PRN/intermittently (wife works as a Engineer, structural in Scientist, research (medical)) Type of Home: [place] Access: [place]: One level     Bathroom Shower/Tub: Other (comment) (roll in shower)         Home Equipment: Wheelchair - manual;BSC/3in1;Hospital bed;Other (comment) (roll in shower chair)   Additional Comments: slide board and roll in shower chair      Prior Functioning/Environment Prior Level of Function : Independent/Modified Independent;Driving             Mobility Comments: Pt reports transfering himself into car/roll in shower chair/and wheelchair. He propels wheelchair independently at baseline. ADLs Comments: Pt reports self cathing and verbalized performing bowl program and transferring self onto [facility] and roll in  shower chair for bathing.        OT Problem List: Decreased strength;Decreased activity tolerance;Decreased safety awareness;Impaired balance (sitting and/or standing);Decreased knowledge of use of DME or AE;Decreased cognition      OT Treatment/Interventions: Self-care/ADL training;Therapeutic exercise;Therapeutic activities;Energy conservation;DME and/or AE instruction;Patient/family education;Balance training    OT Goals(Current goals can be found in the care plan section) Acute Rehab OT Goals Patient Stated Goal: to get stronger and return home OT Goal Formulation: With patient Time For Goal Achievement: 03/19/2022 Potential to Achieve Goals: Fair ADL Goals Pt Will Perform Grooming: with min assist;sitting Pt Will Perform Lower Body Dressing: with mod assist;sitting/lateral leans Pt Will Transfer to Toilet: with mod assist;bedside commode Pt Will Perform Toileting - Clothing Manipulation and hygiene: with mod assist;sitting/lateral leans  OT Frequency: Min 2X/week    Co-evaluation              AM-PAC OT "6 Clicks" Daily Activity     Outcome Measure Help from another person eating meals?: A Little Help from another person taking care of personal grooming?: A Lot Help from another person toileting, which includes using toliet, bedpan, or urinal?: Total Help from another person bathing (  including washing, rinsing, drying)?: Total Help from another person to put on and taking off regular upper body clothing?: A Lot Help from another person to put on and taking off regular lower body clothing?: Total 6 Click Score: 10   End of Session Nurse Communication: Mobility status;Other (comment) (needing linen change)  Activity Tolerance: Patient limited by fatigue Patient left: in bed;with call bell/phone within reach;with bed alarm set                   Time: 4174-0814 OT Time Calculation (min): 22 min Charges:  OT General Charges $OT Visit: 1 Visit OT Evaluation $OT Eval  Moderate Complexity: 1 Mod OT Treatments $Therapeutic Activity: 8-22 mins  Jackquline Denmark, MS, OTR/L , CBIS ascom [phone]  02/24/22, 11:07 AM

## 2022-02-25 DIAGNOSIS — L89312 Pressure ulcer of right buttock, stage 2: Secondary | ICD-10-CM | POA: Diagnosis not present

## 2022-02-25 DIAGNOSIS — M869 Osteomyelitis, unspecified: Secondary | ICD-10-CM | POA: Diagnosis not present

## 2022-02-25 DIAGNOSIS — I4821 Permanent atrial fibrillation: Secondary | ICD-10-CM | POA: Diagnosis not present

## 2022-02-25 DIAGNOSIS — R7881 Bacteremia: Secondary | ICD-10-CM | POA: Diagnosis not present

## 2022-02-25 LAB — HEPARIN LEVEL (UNFRACTIONATED): Heparin Unfractionated: 0.96 IU/mL — ABNORMAL HIGH (ref 0.30–0.70)

## 2022-02-25 LAB — APTT: aPTT: 80 seconds — ABNORMAL HIGH (ref 24–36)

## 2022-02-25 LAB — PHOSPHORUS: Phosphorus: 2.8 mg/dL (ref 2.5–4.6)

## 2022-02-25 MED ORDER — FUROSEMIDE 10 MG/ML IJ SOLN
20.0000 mg | Freq: Once | INTRAMUSCULAR | Status: AC
  Administered 2022-02-25: 20 mg via INTRAVENOUS
  Filled 2022-02-25: qty 2

## 2022-02-25 NOTE — Plan of Care (Signed)

## 2022-02-25 NOTE — Progress Notes (Signed)
Bladder scan 11mL. Wife asking for I&O cath because pt stating he feels like he needs it. Enedina Finner, MD notified, okay to I&O cath. output from I&O cath. Enedina Finner, MD aware.
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
## 2022-02-25 NOTE — Consult Note (Signed)
PHARMACY CONSULT NOTE  Pharmacy Consult for Electrolyte Monitoring and Replacement   Recent Labs: Potassium (mmol/L)  Date Value  02/24/2022 4.3   Magnesium (mg/dL)  Date Value  50/27/7412 2.3   Calcium (mg/dL)  Date Value  87/86/7672 8.1 (L)   Albumin (g/dL)  Date Value  09/47/0962 1.8 (L)   Phosphorus (mg/dL)  Date Value  83/66/2947 2.8   Sodium (mmol/L)  Date Value  02/24/2022 145     Assessment: 65yo M w/ h/o functional quadriplegia, h/o DVT, syringomyelia, Aflutter, peripheral neuropathy, BPH, DJD of shoulder, & neurogenic bladder presenting for evaluation of  Leukocytosis from PCP w/u for RLE c/f cellulitis. Pharmacy consulted for electrolyte mgmt.  Goal of Therapy:  Potassium 4.0 - 5.1 mmol/L Magnesium 2.0 - 2.4 mg/dL All Other Electrolytes WNL   Plan:  All lytes currently WNL No further supplementation needed at this time. Pt has moved from CCU to Floor bed. Phase of care consult completed. Will continue to monitor electrolytes with AM labs x1, but will sign off at this time. Please reconsult if desired for further electrolyte monitoring/mgmt.  Martyn Malay ,PharmD Clinical Pharmacist 02/25/2022 9:07 AM

## 2022-02-26 ENCOUNTER — Other Ambulatory Visit: Payer: Self-pay

## 2022-02-26 ENCOUNTER — Inpatient Hospital Stay
Admit: 2022-02-26 | Discharge: 2022-02-26 | Disposition: A | Discharge status: Home / Self Care | Payer: PPO | Attending: Internal Medicine | Admitting: Internal Medicine

## 2022-02-26 ENCOUNTER — Encounter
Admission: EM | Disposition: A | Discharge status: Hospice | Payer: Self-pay | Source: Home / Self Care | Attending: Internal Medicine

## 2022-02-26 ENCOUNTER — Encounter: Payer: Self-pay | Admitting: Student in an Organized Health Care Education/Training Program

## 2022-02-26 DIAGNOSIS — M8619 Other acute osteomyelitis, multiple sites: Secondary | ICD-10-CM | POA: Diagnosis not present

## 2022-02-26 DIAGNOSIS — R7881 Bacteremia: Secondary | ICD-10-CM | POA: Diagnosis not present

## 2022-02-26 DIAGNOSIS — L89312 Pressure ulcer of right buttock, stage 2: Secondary | ICD-10-CM | POA: Diagnosis not present

## 2022-02-26 DIAGNOSIS — I4821 Permanent atrial fibrillation: Secondary | ICD-10-CM | POA: Diagnosis not present

## 2022-02-26 DIAGNOSIS — B9561 Methicillin susceptible Staphylococcus aureus infection as the cause of diseases classified elsewhere: Secondary | ICD-10-CM | POA: Diagnosis not present

## 2022-02-26 DIAGNOSIS — A419 Sepsis, unspecified organism: Secondary | ICD-10-CM | POA: Diagnosis not present

## 2022-02-26 HISTORY — PX: TEE WITHOUT CARDIOVERSION: SHX5443

## 2022-02-26 LAB — MAGNESIUM: Magnesium: 1.7 mg/dL (ref 1.7–2.4)

## 2022-02-26 LAB — BASIC METABOLIC PANEL
Anion gap: 8 (ref 5–15)
BUN: 24 mg/dL — ABNORMAL HIGH (ref 8–23)
CO2: 27 mmol/L (ref 22–32)
Calcium: 7.7 mg/dL — ABNORMAL LOW (ref 8.9–10.3)
Chloride: 109 mmol/L (ref 98–111)
Creatinine, Ser: 0.8 mg/dL (ref 0.61–1.24)
GFR, Estimated: >60 mL/min (ref >60)
Glucose, Bld: 92 mg/dL (ref 70–99)
Potassium: 3.7 mmol/L (ref 3.5–5.1)
Sodium: 144 mmol/L (ref 135–145)

## 2022-02-26 LAB — CBC
HCT: 37.5 % — ABNORMAL LOW (ref 39.0–52.0)
Hemoglobin: 12 g/dL — ABNORMAL LOW (ref 13.0–17.0)
MCH: 31.6 pg (ref 26.0–34.0)
MCHC: 32 g/dL (ref 30.0–36.0)
MCV: 98.7 fL (ref 80.0–100.0)
Platelets: 348 10*3/uL (ref 150–400)
RBC: 3.8 MIL/uL — ABNORMAL LOW (ref 4.22–5.81)
RDW: 15.9 % — ABNORMAL HIGH (ref 11.5–15.5)
WBC: 15.8 10*3/uL — ABNORMAL HIGH (ref 4.0–10.5)
nRBC: 0 % (ref 0.0–0.2)

## 2022-02-26 LAB — PHOSPHORUS: Phosphorus: 2.9 mg/dL (ref 2.5–4.6)

## 2022-02-26 LAB — APTT: aPTT: 97 seconds — ABNORMAL HIGH (ref 24–36)

## 2022-02-26 LAB — AEROBIC CULTURE W GRAM STAIN (SUPERFICIAL SPECIMEN): Gram Stain: NONE SEEN

## 2022-02-26 LAB — HEPARIN LEVEL (UNFRACTIONATED): Heparin Unfractionated: 0.68 IU/mL (ref 0.30–0.70)

## 2022-02-26 SURGERY — ECHOCARDIOGRAM, TRANSESOPHAGEAL
Anesthesia: Moderate Sedation

## 2022-02-26 MED ORDER — ONDANSETRON HCL 4 MG/2ML IJ SOLN
4.0000 mg | Freq: Four times a day (QID) | INTRAMUSCULAR | Status: DC | PRN
  Administered 2022-02-26: 4 mg via INTRAVENOUS
  Filled 2022-02-26: qty 2

## 2022-02-26 MED ORDER — LIDOCAINE VISCOUS HCL 2 % MT SOLN
OROMUCOSAL | Status: DC | PRN
  Administered 2022-02-26: 15 mL via OROMUCOSAL

## 2022-02-26 MED ORDER — MIDAZOLAM HCL 5 MG/5ML IJ SOLN
INTRAMUSCULAR | Status: DC | PRN
  Administered 2022-02-26: 1 mg via INTRAVENOUS

## 2022-02-26 MED ORDER — FENTANYL CITRATE (PF) 100 MCG/2ML IJ SOLN
INTRAMUSCULAR | Status: DC | PRN
  Administered 2022-02-26: 25 ug via INTRAVENOUS

## 2022-02-26 MED ORDER — MIDAZOLAM HCL 2 MG/2ML IJ SOLN
INTRAMUSCULAR | Status: AC
  Filled 2022-02-26: qty 2

## 2022-02-26 MED ORDER — BUTAMBEN-TETRACAINE-BENZOCAINE 2-2-14 % EX AERO
INHALATION_SPRAY | CUTANEOUS | Status: AC
  Filled 2022-02-26: qty 5

## 2022-02-26 MED ORDER — FENTANYL CITRATE (PF) 100 MCG/2ML IJ SOLN
INTRAMUSCULAR | Status: AC
  Filled 2022-02-26: qty 2

## 2022-02-26 MED ORDER — SODIUM CHLORIDE 0.9 % IV SOLN: INTRAVENOUS | Status: DC

## 2022-02-26 MED ORDER — LIDOCAINE VISCOUS HCL 2 % MT SOLN
OROMUCOSAL | Status: AC
  Filled 2022-02-26: qty 15
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
## 2022-02-26 NOTE — Progress Notes (Signed)
PT Cancellation Note  Patient Details Name: Jesse Whitney MRN: [id number] DOB: January 23, 1956   Cancelled Treatment:    Reason Eval/Treat Not Completed: Patient at procedure or test/unavailable Off unit for TEE. Will check back at later date/time as appropriate.   Shamiya Demeritt A. Dan Humphreys PT, DPT [facility]    Jarron Curley A Hermen Mario 02/26/2022, 2:19 PM

## 2022-02-26 NOTE — Progress Notes (Signed)
*  PRELIMINARY RESULTS* Echocardiogram Echocardiogram Transesophageal has been performed.  Jesse Whitney 02/26/2022, 2:25 PM

## 2022-02-26 NOTE — CV Procedure (Signed)
Transesophageal echocardiogram preliminary report  [name] [id number] 1955-07-27  Preliminary diagnosis  Bacteremia with possible endocarditis  Postprocedural diagnosis Normal LV function without evidence of endocarditis or vegetation  Time out A timeout was performed by the nursing staff and physicians specifically identifying the procedure performed, identification of the patient, the type of sedation, all allergies and medications, all pertinent medical history, and presedation assessment of nasopharynx. The patient and or family understand the risks of the procedure including the rare risks of death, stroke, heart attack, esophogeal perforation, sore throat, and reaction to medications given.  Moderate sedation During this procedure the patient has received Versed 1 milligrams and fentanyl 25 micrograms to achieve appropriate moderate sedation.  The patient had continued monitoring of heart rate, oxygenation, blood pressure, respiratory rate, and extent of signs of sedation throughout the entire procedure.  The patient received this moderate sedation over a period of 8 minutes.  Both the nursing staff and I were present during the procedure when the patient had moderate sedation for 100% of the time.  Treatment considerations  No additional treatment considerations needed for bacteremia due to no current evidence of endocarditis  For further details of transesophageal echocardiogram please refer to final report.  Signed,  Lamar Blinks M.D. [facility] 02/26/2022 2:14 PM

## 2022-02-26 NOTE — Hospital Course (Addendum)
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  Blood cultures positive for MSSA.  Infectious disease following.  Patient underwent TEE on 11/27 which was unremarkable.  PICC line placed 11/28.  Patient noted to have significant scrotal edema and BNP checked found to be elevated and patient started on IV Lasix, with significant response, diuresing almost 5 L and is more than -2 L deficient.  On late afternoon of 11/29, patient went into significant respiratory distress requiring strong supplemental oxygen and having mental status change.  Stroke ruled out by CT.  Patient transferred to ICU.  Chest x-ray not showing significant increase in fluid.  That night, with respiratory therapy help, patient coughed up large volume mucous plug and after that, breathing significantly improved and by morning of 11/30, able to be weaned off of oxygen altogether.  Mentation normalized.  12/1: Patient remains in rapid atrial fibrillation.  Volume status improved.  Of note patient was upset this morning.  Per patient and wife he was left overnight without a bear hugger.  Unclear true etiology of events.  Patient's mental status has been somewhat waxing and waning.   12/2: Patient transferred to medical floor.  Appears much more stable this morning.  Mental status intact.   12/3: Patient's family members were concerned about agitation and delirium yesterday.  Felt to be attributed to Valium.  Valium stopped.  White blood cell count still going up on 12/3.  Heart rate has been difficult to control.   12/4: Valium was found to be a chronic home medication.  Has been restarted.  No acute status changes overnight.  White count starting to downtrend.   12/5: Patient had acute deterioration his  respiratory status on 12/4 evening.  RRT called.  I responded to bedside.  Patient was found to be in severe respiratory distress, hypoxic.  Unclear etiology at this time.  Initiated on BiPAP and transferred emergently to ICU.  PCCM reengaged.  Patient initially improved on the BiPAP however refused to wear it overnight.  Was also not very compliant with his high flow nasal cannula.  Stated to overnight ICU NP that he was "tired".  Palliative care engaged for further goals of care discussion.  12/6.  Patient excepted to the hospice home.  Goal is comfort care at this point.

## 2022-02-26 NOTE — Progress Notes (Signed)
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  Blood cultures positive for MSSA.  Infectious disease following and recommending patient to have TEE, which was done by cardiology on 11/27.  Assessment and Plan:   Methicillin-susceptible Staph aureus septic shock secondary to right decubitus ulcer right buttock.   Weaned off of vasopressor.  ID following.  Following TEE, will determine length of time for IV Ancef.  Patient met criteria for septic shock on admission given tachycardia and tachypnea plus persistent hypotension requiring pressor support.   Right tibial plateau fracture status post recent fall -- patient was seen by Dr. Mack Guise orthopedic who recommends no surgical intervention. 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 Dr. Mack Guise recommends follow-up in the office in 1 to 2 weeks to see if patient would be a candidate for telescoping knee brace for immobilization.  As needed pain medication.  Overweight Meets criteria with BMI greater than 25   Acute on chronic a fib with RVR in the setting of sepsis -- will resume Cardizem CD 180 mg plus beta-blocker.  Continue amiodarone.  Heart rate at times still elevated.  Now that he is post TEE, will change back to Eliquis.  Cardiology following.     Spinal cord injury with functional quadriplegia and neurogenic bladder and autonomic dysreflexia -- symptomatic care    Body mass index is 29.28 kg/m.  Nutrition Problem: Increased nutrient needs Etiology: wound healing Pressure Injury 12/11/21  Buttocks Right;Lower Stage 2 -  Partial thickness loss of dermis presenting as a shallow open injury with a red, pink wound bed without slough. (Active)  12/11/21 0610  Location: Buttocks  Location Orientation: Right;Lower  Staging: Stage 2 -  Partial thickness loss of dermis presenting as a shallow open injury with a red, pink wound bed without slough.  Wound Description (Comments):   Present on Admission: Yes     Pressure Injury 02/21/22 Ankle Anterior;Right abrasion (Active)  02/21/22 0346  Location: Ankle  Location Orientation: Anterior;Right  Staging:   Wound Description (Comments): abrasion  Present on Admission: Yes  Dressing Type Foam - Lift dressing to assess site every shift 02/25/22 2100     Pressure Injury 02/21/22 Ischial tuberosity Right pressure injury; open (Active)  02/21/22 0346  Location: Ischial tuberosity  Location Orientation: Right  Staging:   Wound Description (Comments): pressure injury; open  Present on Admission: Yes  Dressing Type Foam - Lift dressing to assess site every shift;[place] 02/25/22 2100    Pressure ulcers including stage II of the buttocks and right ankle, stage IV ischial tuberosity, all present on admission  Consultants: Cardiology Critical care Infectious disease Orthopedic surgery  Procedures: TEE 11/27 2D echocardiogram done 11/23 noting ejection fraction of 35 to 40%  Antimicrobials: IV Flagyl 11/24-present IV Ancef 11/24-present  Code Status: Full code   Subjective: Denies any complaints  Objective: Vital signs were reviewed and unremarkable. Vitals:   02/26/22 1430 02/26/22 1445  BP: 96/69 119/81  Pulse: 90 95  Resp: 16 16  Temp:    SpO2: 93% 90%    Intake/Output Summary (Last 24 hours) at  02/26/2022 1526 Last data filed at 02/26/2022 1331 Gross per 24 hour  Intake 1437.03 ml  Output --  Net 1437.03 ml   Filed Weights   02/20/22 1427 02/25/22 0500 02/26/22 1353  Weight: 68 kg 84.8 kg 84.8 kg   Body  mass index is 29.28 kg/m.  Exam:  General: Alert and oriented x 3, no acute distress HEENT: Normocephalic, atraumatic, mucous membranes are moist Cardiovascular:  Irregular rhythm, tachycardic Respiratory: clear to auscultation bilaterally Abdomen: Soft, nontender, nondistended, positive bowel sounds Musculoskeletal: No clubbing or cyanosis, trace pitting edema Skin: Pressure ulcers as described above Psychiatry: Appropriate, no evidence of psychoses Neurology: Spastic paraplegia  Data Reviewed: White blood cell count down to 15.8  Disposition:  Status is: Inpatient Remains inpatient appropriate because:  -Long-term IV line and antibiotic disposition to be determined    Anticipated discharge date: 11/30  Family Communication: Will call family DVT Prophylaxis: SCDs Start: 02/20/22 2013    Author: Annita Brod ,MD 02/26/2022 3:26 PM  To reach On-call, see care teams to locate the attending and reach out via [email or web address]. Between 7PM-7AM, please contact night-coverage If you still have difficulty reaching the attending provider, please page the [facility] (Director on Call) for [facility] on amion for assistance.
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
## 2022-02-27 ENCOUNTER — Inpatient Hospital Stay: Payer: Self-pay

## 2022-02-27 ENCOUNTER — Inpatient Hospital Stay: Payer: PPO

## 2022-02-27 ENCOUNTER — Other Ambulatory Visit (HOSPITAL_COMMUNITY): Payer: Self-pay

## 2022-02-27 ENCOUNTER — Encounter: Payer: Self-pay | Admitting: Internal Medicine

## 2022-02-27 DIAGNOSIS — A4901 Methicillin susceptible Staphylococcus aureus infection, unspecified site: Secondary | ICD-10-CM | POA: Diagnosis not present

## 2022-02-27 DIAGNOSIS — M8619 Other acute osteomyelitis, multiple sites: Secondary | ICD-10-CM | POA: Diagnosis not present

## 2022-02-27 DIAGNOSIS — R7881 Bacteremia: Secondary | ICD-10-CM | POA: Diagnosis not present

## 2022-02-27 DIAGNOSIS — L89312 Pressure ulcer of right buttock, stage 2: Secondary | ICD-10-CM | POA: Diagnosis not present

## 2022-02-27 DIAGNOSIS — I5023 Acute on chronic systolic (congestive) heart failure: Secondary | ICD-10-CM

## 2022-02-27 DIAGNOSIS — M869 Osteomyelitis, unspecified: Secondary | ICD-10-CM | POA: Diagnosis not present

## 2022-02-27 DIAGNOSIS — A419 Sepsis, unspecified organism: Secondary | ICD-10-CM | POA: Diagnosis not present

## 2022-02-27 LAB — CBC
HCT: 34.7 % — ABNORMAL LOW (ref 39.0–52.0)
Hemoglobin: 11.2 g/dL — ABNORMAL LOW (ref 13.0–17.0)
MCH: 32.5 pg (ref 26.0–34.0)
MCHC: 32.3 g/dL (ref 30.0–36.0)
MCV: 100.6 fL — ABNORMAL HIGH (ref 80.0–100.0)
Platelets: 317 10*3/uL (ref 150–400)
RBC: 3.45 MIL/uL — ABNORMAL LOW (ref 4.22–5.81)
RDW: 15.9 % — ABNORMAL HIGH (ref 11.5–15.5)
WBC: 15.2 10*3/uL — ABNORMAL HIGH (ref 4.0–10.5)
nRBC: 0 % (ref 0.0–0.2)

## 2022-02-27 LAB — CULTURE, BLOOD (ROUTINE X 2)
Culture: NO GROWTH
Culture: NO GROWTH
Special Requests: ADEQUATE

## 2022-02-27 LAB — HEPARIN LEVEL (UNFRACTIONATED): Heparin Unfractionated: 0.8 IU/mL — ABNORMAL HIGH (ref 0.30–0.70)

## 2022-02-27 LAB — BRAIN NATRIURETIC PEPTIDE: B Natriuretic Peptide: 423.5 pg/mL — ABNORMAL HIGH (ref 0.0–100.0)

## 2022-02-27 LAB — VITAMIN A: Vitamin A (Retinoic Acid): <2.5 ug/dL — ABNORMAL LOW (ref 22.0–69.5)

## 2022-02-27 MED ORDER — FUROSEMIDE 10 MG/ML IJ SOLN
20.0000 mg | Freq: Two times a day (BID) | INTRAMUSCULAR | Status: DC
  Administered 2022-02-27 – 2022-02-28 (×3): 20 mg via INTRAVENOUS
  Filled 2022-02-27 (×3): qty 2

## 2022-02-27 MED ORDER — SODIUM CHLORIDE 0.9% FLUSH
10.0000 mL | Freq: Two times a day (BID) | INTRAVENOUS | Status: DC
  Administered 2022-02-27 – 2022-03-06 (×10): 10 mL

## 2022-02-27 MED ORDER — AMIODARONE HCL 200 MG PO TABS
200.0000 mg | ORAL_TABLET | Freq: Two times a day (BID) | ORAL | Status: AC
  Administered 2022-02-27: 200 mg via ORAL
  Filled 2022-02-27: qty 1

## 2022-02-27 MED ORDER — MORPHINE SULFATE ER 15 MG PO TBCR
15.0000 mg | EXTENDED_RELEASE_TABLET | Freq: Two times a day (BID) | ORAL | Status: DC
  Administered 2022-02-27 – 2022-03-07 (×13): 15 mg via ORAL
  Filled 2022-02-27 (×14): qty 1

## 2022-02-27 MED ORDER — AMIODARONE HCL 200 MG PO TABS
200.0000 mg | ORAL_TABLET | Freq: Every day | ORAL | Status: DC
  Administered 2022-02-28 – 2022-03-05 (×6): 200 mg via ORAL
  Filled 2022-02-27 (×6): qty 1

## 2022-02-27 MED ORDER — ALBUMIN HUMAN 25 % IV SOLN
12.5000 g | Freq: Once | INTRAVENOUS | Status: AC
  Administered 2022-02-27 (×2): 12.5 g via INTRAVENOUS
  Filled 2022-02-27: qty 50

## 2022-02-27 MED ORDER — SODIUM CHLORIDE 0.9% FLUSH: 10.0000 mL | INTRAVENOUS | Status: DC | PRN

## 2022-02-27 MED ORDER — GUAIFENESIN ER 600 MG PO TB12
600.0000 mg | ORAL_TABLET | Freq: Two times a day (BID) | ORAL | Status: DC
  Administered 2022-02-27 – 2022-03-07 (×17): 600 mg via ORAL
  Filled 2022-02-27 (×18): qty 1

## 2022-02-27 MED ORDER — IPRATROPIUM-ALBUTEROL 0.5-2.5 (3) MG/3ML IN SOLN: 3.0000 mL | RESPIRATORY_TRACT | Status: DC | PRN
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
## 2022-02-27 NOTE — Progress Notes (Signed)
Physical Therapy Treatment Patient Details Name: Jesse Whitney MRN: [id number] DOB: 03-Nov-1955 Today's Date: 02/27/2022   History of Present Illness 66yo M w/ h/o functional quadriplegia, h/o DVT, syringomyelia, Aflutter, peripheral neuropathy, BPH, DJD of shoulder, & neurogenic bladder presenting for evaluation of   Leukocytosis from PCP w/u for RLE c/f cellulitis.    PT Comments    Patient seen in conjunction with OT for patient and therapist safety for attempt for OOB to chair transfer. Patient required totalA+2 for bed mobility and totalA to maintain sitting balance 2/2 poor trunk control. Transferred to recliner via lateral scoot with totalA+2. Once in recliner, noted to have BM so transferred back to bed with totalA+2. Discussed discharge recommendation and need for +2 assistance at home via hoyer lift with wife and patient. Patient with poor awareness into deficits and need for +2 assistance while requesting to return home. Wife does not have additional assistance at home and would need +2 to utilize hoyer lift in safe manner or patient will be essentially bedbound if discharging home. Continue to recommend SNF for ongoing Physical Therapy.      Recommendations for follow up therapy are one component of a multi-disciplinary discharge planning process, led by the attending physician.  Recommendations may be updated based on patient status, additional functional criteria and insurance authorization.  Follow Up Recommendations  Skilled nursing-short term rehab (<3 hours/day) Can patient physically be transported by private vehicle: No   Assistance Recommended at Discharge Frequent or constant Supervision/Assistance  Patient can return home with the following Two people to help with walking and/or transfers;Two people to help with bathing/dressing/bathroom;Assistance with cooking/housework;Assistance with feeding;Assist for transportation;Direct supervision/assist for medications  management;Direct supervision/assist for financial management   Equipment Recommendations  Other (comment) (hoyer lift)    Recommendations for Other Services       Precautions / Restrictions Precautions Precautions: Fall Precaution Comments: hx of SCI Restrictions Weight Bearing Restrictions: No     Mobility  Bed Mobility Overal bed mobility: Needs Assistance Bed Mobility: Rolling, Supine to Sit, Sit to Supine Rolling: Total assist, +2 for physical assistance, +2 for safety/equipment   Supine to sit: Total assist, +2 for physical assistance, +2 for safety/equipment Sit to supine: Total assist, +2 for physical assistance, +2 for safety/equipment        Transfers Overall transfer level: Needs assistance Equipment used: 2 person hand held assist Transfers: Bed to chair/wheelchair/BSC            Lateral/Scoot Transfers: Total assist, +2 physical assistance, +2 safety/equipment General transfer comment: poor trunk control with patient unable to assist with lateral scoot transfer. Lateral scoot to/from recliner. Returned to bed due to bowel incontinence    Ambulation/Gait                   Stairs             Wheelchair Mobility    Modified Rankin (Stroke Patients Only)       Balance Overall balance assessment: Needs assistance Sitting-balance support: Feet supported, Bilateral upper extremity supported Sitting balance-Leahy Scale: Zero Sitting balance - Comments: max- total A for static sitting balance                                    Cognition Arousal/Alertness: Awake/alert Behavior During Therapy: Flat affect Overall Cognitive Status: Impaired/Different from baseline Area of Impairment: Memory, Attention, Following commands, Safety/judgement, Awareness, Problem solving  Current Attention Level: Selective Memory: Decreased short-term memory Following Commands: Follows one step commands  inconsistently, Follows one step commands with increased time Safety/Judgement: Decreased awareness of deficits, Decreased awareness of safety Awareness: Emergent Problem Solving: Slow processing, Decreased initiation, Difficulty sequencing, Requires verbal cues, Requires tactile cues General Comments: poor awareness into current deficits and of need for +2 assistance.        Exercises      General Comments        Pertinent Vitals/Pain Pain Assessment Pain Assessment: No/denies pain    Home Living                          Prior Function            PT Goals (current goals can now be found in the care plan section) Acute Rehab PT Goals Patient Stated Goal: to return to baseline PT Goal Formulation: With patient Time For Goal Achievement: 03/17/2022 Potential to Achieve Goals: Fair Progress towards PT goals: Progressing toward goals    Frequency    Min 2X/week      PT Plan Current plan remains appropriate    Co-evaluation PT/OT/SLP Co-Evaluation/Treatment: Yes Reason for Co-Treatment: Complexity of the patient's impairments (multi-system involvement);For patient/therapist safety;To address functional/ADL transfers PT goals addressed during session: Mobility/safety with mobility;Balance OT goals addressed during session: ADL's and self-care      AM-PAC PT "6 Clicks" Mobility   Outcome Measure  Help needed turning from your back to your side while in a flat bed without using bedrails?: Total Help needed moving from lying on your back to sitting on the side of a flat bed without using bedrails?: Total Help needed moving to and from a bed to a chair (including a wheelchair)?: Total Help needed standing up from a chair using your arms (e.g., wheelchair or bedside chair)?: Total Help needed to walk in hospital room?: Total Help needed climbing 3-5 steps with a railing? : Total 6 Click Score: 6    End of Session   Activity Tolerance: Patient tolerated  treatment well Patient left: in bed;with call bell/phone within reach;with nursing/sitter in room Nurse Communication: Mobility status PT Visit Diagnosis: Other abnormalities of gait and mobility (R26.89)     Time: 1350-1417 PT Time Calculation (min) (ACUTE ONLY): 27 min  Charges:  $Therapeutic Activity: 8-22 mins                     [place] Lozito A. Gilford Rile PT, DPT [place] A Nahla Lukin 02/27/2022, 4:12 PM

## 2022-02-27 NOTE — Progress Notes (Signed)
Peripherally Inserted Central Catheter Placement  The IV Nurse has discussed with the patient and/or persons authorized to consent for the patient, the purpose of this procedure and the potential benefits and risks involved with this procedure.  The benefits include less needle sticks, lab draws from the catheter, and the patient may be discharged home with the catheter. Risks include, but not limited to, infection, bleeding, blood clot (thrombus formation), and puncture of an artery; nerve damage and irregular heartbeat and possibility to perform a PICC exchange if needed/ordered by physician.  Alternatives to this procedure were also discussed.  Bard Power PICC patient education guide, fact sheet on infection prevention and patient information card has been provided to patient /or left at bedside.    PICC Placement Documentation  PICC Single Lumen 02/27/22 Right Basilic 42 cm 0 cm (Active)  Indication for Insertion or Continuance of Line Home intravenous therapies (PICC only) 02/27/22 1730  Exposed Catheter (cm) 0 cm 02/27/22 1730  Site Assessment Clean, Dry, Intact 02/27/22 1730  Line Status Flushed;Saline locked;Blood return noted 02/27/22 1730  Dressing Type Transparent;Securing device 02/27/22 1730  Dressing Status Antimicrobial disc in place;Clean, Dry, Intact 02/27/22 1730  Safety Lock Not Applicable 02/27/22 1730  Line Care Connections checked and tightened 02/27/22 1730  Line Adjustment (NICU/IV Team Only) No 02/27/22 1730  Dressing Intervention New dressing 02/27/22 1730  Dressing Change Due 03/06/22 02/27/22 1730       Vernona Rieger  Jesse Whitney 02/27/2022, 5:31 PM

## 2022-02-27 NOTE — Progress Notes (Signed)
[facility] Progress Note  Patient: Jesse Whitney    WUX:[id number]  DOA: 02/20/2022    Date of Service: the patient was seen and examined on 02/27/2022  Brief hospital course: 66 year old male with functional paraplegia presented to the emergency room on 11/21 after being sent over by his PCP for evaluation of high white blood cell count with extended workup revealing septic shock secondary to osteomyelitis of right ischial tuberosity from pressure injury.  Blood cultures positive for MSSA.  Infectious disease following.  Patient underwent TEE on 11/27 which was unremarkable.  PICC line placed 11/28.  Patient noted to have significant scrotal edema and BNP checked found to be elevated and patient started on IV Lasix.  Assessment and Plan:   Methicillin-susceptible Staph aureus septic shock secondary to right decubitus ulcer right buttock.   Weaned off of vasopressor.  ID following.  Following TEE, will determine length of time for IV Ancef.  Patient met criteria for septic shock on admission given tachycardia and tachypnea plus persistent hypotension requiring pressor support.   Right tibial plateau fracture status post recent fall -- patient was seen by Dr. Mack Guise orthopedic who recommends no surgical intervention. 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 Dr. Mack Guise recommends follow-up in the office in 1 to 2 weeks to see if patient would be a candidate for telescoping knee brace for immobilization.  As needed pain medication.  Overweight Meets criteria with BMI greater than 25   Acute on chronic a fib with RVR in the setting of sepsis -- will resume Cardizem CD 180 mg plus beta-blocker.  Continue amiodarone.  Heart rate at times still elevated.  Now that he is post TEE, will change back to Eliquis.  Cardiology following.     Spinal cord injury with functional quadriplegia and neurogenic bladder and autonomic dysreflexia -- symptomatic care  Acute on  chronic systolic heart failure: Echocardiogram in September of this year noted preserved ejection fraction and grade 2 diastolic dysfunction and echocardiogram done 11/23 noted ejection fraction of 35 to 25% with no diastolic dysfunction noted.  TEE notes preserved ejection fraction.  Will discuss with cardiology as to inconsistencies.  Regardless, noted to have significant scrotal edema and while he is currently not short of breath, was noted to be hypoxic at night prior.  Currently oxygen saturations above 90% on room air.  Mildly added BNP of 500 sows have ordered some IV Lasix and given poor albumin, added IV albumin to improve output.    Body mass index is 26.79 kg/m.  Nutrition Problem: Increased nutrient needs Etiology: wound healing Pressure Injury 12/11/21 Buttocks Right;Lower Stage 2 -  Partial thickness loss of dermis presenting as a shallow open injury with a red, pink wound bed without slough. (Active)  12/11/21 0610  Location: Buttocks  Location Orientation: Right;Lower  Staging: Stage 2 -  Partial thickness loss of dermis presenting as a shallow open injury with a red, pink wound bed without slough.  Wound Description (Comments):   Present on Admission: Yes     Pressure Injury 02/21/22 Ankle Anterior;Right abrasion (Active)  02/21/22 0346  Location: Ankle  Location Orientation: Anterior;Right  Staging:   Wound Description (Comments): abrasion  Present on Admission: Yes  Dressing Type Foam - Lift dressing to assess site every shift 02/26/22 2000     Pressure Injury 02/21/22 Ischial tuberosity Right pressure injury; open (Active)  02/21/22 0346  Location: Ischial tuberosity  Location Orientation: Right  Staging:   Wound Description (Comments): pressure  injury; open  Present on Admission: Yes  Dressing Type Foam - Lift dressing to assess site every shift 02/26/22 2000    Pressure ulcers including stage II of the buttocks and right ankle, stage IV ischial tuberosity, all  present on admission  Consultants: Cardiology Critical care Infectious disease Orthopedic surgery  Procedures: TEE 11/27 2D echocardiogram done 11/23 noting ejection fraction of 35 to 40%  Antimicrobials: IV Flagyl 11/24-present IV Ancef 11/24-present  Code Status: Full code   Subjective: Denies any complaints  Objective: Vital signs were reviewed and unremarkable. Vitals:   02/27/22 0720 02/27/22 1134  BP: 115/79 107/73  Pulse: 97 82  Resp: 20 20  Temp: 99.1 F (37.3 C) 98.7 F (37.1 C)  SpO2: 93% 92%    Intake/Output Summary (Last 24 hours) at 02/27/2022 1528 Last data filed at 02/27/2022 1425 Gross per 24 hour  Intake 960 ml  Output 1000 ml  Net -40 ml    Filed Weights   02/25/22 0500 02/26/22 1353 02/27/22 0500  Weight: 84.8 kg 84.8 kg 77.6 kg   Body mass index is 26.79 kg/m.  Exam:  General: Alert and oriented x 3, no acute distress HEENT: Normocephalic, atraumatic, mucous membranes are moist Cardiovascular:  Irregular rhythm, tachycardic Respiratory: clear to auscultation bilaterally Abdomen: Soft, nontender, nondistended, positive bowel sounds Musculoskeletal: No clubbing or cyanosis, trace pitting edema Skin: Pressure ulcers as described above.  Significant scrotal edema. Psychiatry: Appropriate, no evidence of psychoses Neurology: Spastic paraplegia  Data Reviewed: \White blood cell count with little change from previous day.  BNP at 423.5  Disposition:  Status is: Inpatient Remains inpatient appropriate because:  Setting up home IV antibiotics Diuresing    Anticipated discharge date: 11/30  Family Communication: Wife at the bedside DVT Prophylaxis: SCDs Start: 02/20/22 2013    Author: Annita Brod ,MD 02/27/2022 3:28 PM  To reach On-call, see care teams to locate the attending and reach out via [email or web address]. Between 7PM-7AM, please contact night-coverage If you still have difficulty reaching the attending provider,  please page the [facility] (Director on Call) for [facility] on amion for assistance.

## 2022-02-27 NOTE — Progress Notes (Signed)
       CROSS COVER NOTE  NAME: Jesse Whitney MRN: [id number] DOB : 1955-04-04 ATTENDING PHYSICIAN: Jesse Espy, MD    Date of Service   02/27/2022   HPI/Events of Note   Message received from RN that Mr Jesse Whitney is reporting Dyspnea. RN reports his oxygen saturation decreased to 86% and required 6L to give O2 sat above 90%.   On my assessment lung sounds are decreased and rhonchourous on the (L).  He does not have increased work of breathing and no conversational dyspnea appreciated.  He was recently seen outpatient by vascular and [place] of RLE was performed and negative for DVT. He has a DVT history and has been compliant with outpatient Eliquis.  Interventions   Assessment/Plan:  CXR---> (L) basilar consolidation with small effusion. Concerning for pneumonia Oxygen, wean as able Continue Ancef Mucinex PRN Duoneb Incentive spirometry      This document was prepared using Conservation officer, historic buildings and may include unintentional dictation errors.  Bishop Limbo DNP, MBA, FNP-BC Nurse Practitioner [facility] Pager [phone]

## 2022-02-27 NOTE — Progress Notes (Signed)
ID Spinal cord injury, quadriplegia, autonomic dysrelfexia, neurogenic bladder self cath, Afib RVR recent hospitalization. Pt fell out of his car when he was trying to get out and hurt his rt leg- a few days later it was swollen and discolored- His PCP had given him antibiotics ( levaquin+ flagyl_ for the rt ischial pressure wound and sent off lab work- as WBC was high he was asked to go to the ED On presentation to the ED on 02/20/22 His vitals were   02/20/22  BP 87/62 (L)  Temp 97.9 F (36.6 C)  Pulse Rate 60  Resp 10  SpO2 98 %      Labs   Latest Reference Range & Units 02/20/22  WBC 4.0 - 10.5 K/uL 20.2 (H)  Hemoglobin 13.0 - 17.0 g/dL 12.3 (L)  HCT 39.0 - 52.0 % 37.8 (L)  Platelets 150 - 400 K/uL 345  Creatinine 0.61 - 1.24 mg/dL 0.88  HE was admitted to ICU for pressors A CT rt leg showed subacute comminuted fracture of the rt tibial plateau  and fracture of the fibular head. There was asymmetric necrosis and periosteal reaction of the rt ischial tuberosity concerning for Osteo HE has had a wound for > 2 months and his wife cleans it His blood culture came positive for MSSA   Subjective Pt is more alert and talking today Wife at bedside   O/e pt is awake and alert -  BP 107/73 (BP Location: Left Arm)   Pulse 82   Temp 98.7 F (37.1 C) (Oral)   Resp 20   Ht _0  (1.702 m)   Wt 77.6 kg   SpO2 92%   BMI 26.79 kg/m   Chest b/l air entry Hs- irrgeulr Able to move upper extremities Tremors b/l  Pt has rt ischial tuberoisty pressure wound    Scrotal edema and redness Rt leg bruising   Labs    Latest Ref Rng & Units 02/27/2022    7:34 AM 02/26/2022    7:24 AM 02/24/2022    5:55 AM  CBC  WBC 4.0 - 10.5 K/uL 15.2  15.8  24.7   Hemoglobin 13.0 - 17.0 g/dL 11.2  12.0  13.0   Hematocrit 39.0 - 52.0 % 34.7  37.5  39.7   Platelets 150 - 400 K/uL 317  348  375        Latest Ref Rng & Units 02/26/2022    4:31 AM 02/24/2022    5:55 AM 02/23/2022    5:24 AM   CMP  Glucose 70 - 99 mg/dL 92  169  155   BUN 8 - 23 mg/dL _1 Creatinine 0.61 - 1.24 mg/dL 0.80  0.87  0.61   Sodium 135 - 145 mmol/L 144  145  141   Potassium 3.5 - 5.1 mmol/L 3.7  4.3  3.7   Chloride 98 - 111 mmol/L 109  112  107   CO2 22 - 32 mmol/L _2 Calcium 8.9 - 10.3 mg/dL 7.7  8.1  7.4     Micro 02/20/22- MSSA  both cultures 11/23 wound culture MSSA 02/22/22 BC- NG so far  Impression/recommendation  Fall with fracture tibia and fibula of the rt side- extensive swelling and bruising  MSSA bacteremia- source  rt ischial wound which also has MSSA Rt ischial wound with possible underlying osteo of the bone  Repeat blood culture is neg so far TEE done today is neg  for endocarditis  Currently on cefazolin and flagyl DC flagyl Will give IV cefazolin for 4 weeks and then will switch to oral antibiotic   Afib on amiodarone H/o Traumatic spine injury with quadriparesisi  Autonomic dysreflexia  Discussed the management with patient and family and his nurse  OPAT Diagnosis: MSSA bacteremia with rt ischial decubitus and osteomyelitis of the sacral bone Baseline Creatinine <1   Allergies  Allergen Reactions   Contrast Media [Iodinated Contrast Media] Nausea And Vomiting   Iohexol Nausea And Vomiting    Discharge antibiotics: Cefazolin 2 grams IV every 8 hours until  End Date: 03/23/22 Followed by PO Keflex 1 gram Q 6 until 04/05/22  [facility] Care Per Protocol:  Labs weekly while on IV antibiotics: _X_ CBC with differential  X__ CMP _X_ CRP _X_ ESR   _X_ Please pull PIC at completion of IV antibiotics   Fax weekly lab results  promptly to [phone]  Clinic Follow Up Appt:03/15/22 at 11.30 video visit   Call [phone] with any questions

## 2022-02-27 NOTE — Progress Notes (Signed)
Occupational Therapy Treatment Patient Details Name: Jesse Whitney MRN: [id number] DOB: [birth year] Today's Date: 02/27/2022   History of present illness 66yo M w/ h/o functional quadriplegia, h/o DVT, syringomyelia, Aflutter, peripheral neuropathy, BPH, DJD of shoulder, & neurogenic bladder presenting for evaluation of   Leukocytosis from PCP w/u for RLE c/f cellulitis.   OT comments  Upon entering the room, pt supine in bed with wife present in the room. Pt seen for skilled co-treatment with PT for pt and therapist safety. Pt needing total A of 2 for bed mobility. Static sitting balance requiring total assistance while on EOB. Total A of 2 for lateral scoot to recliner chair with hoyer pad set up for nursing. However, pt then noted to have had BM and transferred back to bed in same manner as above. OT reviewing recommendations with wife and Pt. She reports it will only be her assisting at home. I discussed hoyer lift but verbalized she would need two people to utilize and pt would be essentially bed bound if he returned home with [facility] therapies. Continued recommendation for SNF at hospital discharge.    Recommendations for follow up therapy are one component of a multi-disciplinary discharge planning process, led by the attending physician.  Recommendations may be updated based on patient status, additional functional criteria and insurance authorization.    Follow Up Recommendations  Skilled nursing-short term rehab (<3 hours/day)     Assistance Recommended at Discharge Frequent or constant Supervision/Assistance  Patient can return home with the following  Two people to help with walking and/or transfers;Assist for transportation;Help with stairs or ramp for entrance;Two people to help with bathing/dressing/bathroom   Equipment Recommendations  Other (comment) (defer to next venue of care)       Precautions / Restrictions Precautions Precautions: Fall Precaution Comments: hx of  SCI Restrictions Weight Bearing Restrictions: No       Mobility Bed Mobility Overal bed mobility: Needs Assistance Bed Mobility: Rolling, Supine to Sit, Sit to Supine Rolling: Total assist, +2 for physical assistance, +2 for safety/equipment   Supine to sit: Total assist, +2 for physical assistance, +2 for safety/equipment Sit to supine: Total assist, +2 for physical assistance, +2 for safety/equipment        Transfers Overall transfer level: Needs assistance Equipment used: 2 person hand held assist Transfers: Bed to chair/wheelchair/BSC            Lateral/Scoot Transfers: +2 safety/equipment, +2 physical assistance, Total assist General transfer comment: would require a mechanical lift or total dependent lift at this time     Balance Overall balance assessment: Needs assistance Sitting-balance support: Feet supported, Bilateral upper extremity supported Sitting balance-Leahy Scale: Poor Sitting balance - Comments: max- total A for static sitting balance                                   ADL either performed or assessed with clinical judgement    Extremity/Trunk Assessment Upper Extremity Assessment Upper Extremity Assessment: Generalized weakness            Vision Patient Visual Report: No change from baseline            Cognition Arousal/Alertness: Awake/alert Behavior During Therapy: Flat affect Overall Cognitive Status: Impaired/Different from baseline Area of Impairment: Memory, Attention, Following commands, Safety/judgement, Awareness, Problem solving                   Current Attention Level:  Selective Memory: Decreased short-term memory Following Commands: Follows one step commands inconsistently, Follows one step commands with increased time Safety/Judgement: Decreased awareness of deficits, Decreased awareness of safety Awareness: Emergent Problem Solving: Slow processing, Decreased initiation, Difficulty sequencing,  Requires verbal cues, Requires tactile cues                     Pertinent Vitals/ Pain       Pain Assessment Pain Assessment: No/denies pain         Frequency  Min 2X/week        Progress Toward Goals  OT Goals(current goals can now be found in the care plan section)  Progress towards OT goals: Progressing toward goals  Acute Rehab OT Goals Patient Stated Goal: to get stronger OT Goal Formulation: With patient/family Time For Goal Achievement: 2022/03/20 Potential to Achieve Goals: Fair  Plan Discharge plan remains appropriate;Frequency remains appropriate    Co-evaluation    PT/OT/SLP Co-Evaluation/Treatment: Yes Reason for Co-Treatment: Complexity of the patient's impairments (multi-system involvement);For patient/therapist safety;To address functional/ADL transfers PT goals addressed during session: Mobility/safety with mobility;Balance OT goals addressed during session: ADL's and self-care      AM-PAC OT "6 Clicks" Daily Activity     Outcome Measure   Help from another person eating meals?: A Lot Help from another person taking care of personal grooming?: Total Help from another person toileting, which includes using toliet, bedpan, or urinal?: Total Help from another person bathing (including washing, rinsing, drying)?: Total Help from another person to put on and taking off regular upper body clothing?: Total Help from another person to put on and taking off regular lower body clothing?: Total 6 Click Score: 7    End of Session    OT Visit Diagnosis: Unsteadiness on feet (R26.81);Repeated falls (R29.6);Muscle weakness (generalized) (M62.81);History of falling (Z91.81)   Activity Tolerance Patient limited by fatigue   Patient Left in bed;with call bell/phone within reach;with nursing/sitter in room;with family/visitor present   Nurse Communication Mobility status        Time: 1349-1418 OT Time Calculation (min): 29 min  Charges: OT General  Charges $OT Visit: 1 Visit OT Treatments $Therapeutic Activity: 8-22 mins  Jackquline Denmark, MS, OTR/L , CBIS ascom [phone]  02/27/22, 2:47 PM

## 2022-02-28 ENCOUNTER — Inpatient Hospital Stay: Payer: PPO

## 2022-02-28 DIAGNOSIS — J9602 Acute respiratory failure with hypercapnia: Secondary | ICD-10-CM | POA: Diagnosis not present

## 2022-02-28 DIAGNOSIS — G928 Other toxic encephalopathy: Secondary | ICD-10-CM

## 2022-02-28 DIAGNOSIS — J9601 Acute respiratory failure with hypoxia: Secondary | ICD-10-CM

## 2022-02-28 DIAGNOSIS — E876 Hypokalemia: Secondary | ICD-10-CM

## 2022-02-28 DIAGNOSIS — G9341 Metabolic encephalopathy: Secondary | ICD-10-CM | POA: Diagnosis not present

## 2022-02-28 DIAGNOSIS — I5023 Acute on chronic systolic (congestive) heart failure: Secondary | ICD-10-CM | POA: Diagnosis not present

## 2022-02-28 LAB — CBC WITH DIFFERENTIAL/PLATELET
Abs Immature Granulocytes: 0.32 10*3/uL — ABNORMAL HIGH (ref 0.00–0.07)
Basophils Absolute: 0.1 10*3/uL (ref 0.0–0.1)
Basophils Relative: 0 %
Eosinophils Absolute: 0.1 10*3/uL (ref 0.0–0.5)
Eosinophils Relative: 0 %
HCT: 38.9 % — ABNORMAL LOW (ref 39.0–52.0)
Hemoglobin: 12.2 g/dL — ABNORMAL LOW (ref 13.0–17.0)
Immature Granulocytes: 2 %
Lymphocytes Relative: 4 %
Lymphs Abs: 0.7 10*3/uL (ref 0.7–4.0)
MCH: 32.1 pg (ref 26.0–34.0)
MCHC: 31.4 g/dL (ref 30.0–36.0)
MCV: 102.4 fL — ABNORMAL HIGH (ref 80.0–100.0)
Monocytes Absolute: 0.8 10*3/uL (ref 0.1–1.0)
Monocytes Relative: 5 %
Neutro Abs: 14.9 10*3/uL — ABNORMAL HIGH (ref 1.7–7.7)
Neutrophils Relative %: 89 %
Platelets: 363 10*3/uL (ref 150–400)
RBC: 3.8 MIL/uL — ABNORMAL LOW (ref 4.22–5.81)
RDW: 15.5 % (ref 11.5–15.5)
WBC: 16.9 10*3/uL — ABNORMAL HIGH (ref 4.0–10.5)
nRBC: 0.2 % (ref 0.0–0.2)

## 2022-02-28 LAB — BASIC METABOLIC PANEL
Anion gap: 8 (ref 5–15)
Anion gap: 5 (ref 5–15)
BUN: 15 mg/dL (ref 8–23)
BUN: 15 mg/dL (ref 8–23)
CO2: 28 mmol/L (ref 22–32)
CO2: 31 mmol/L (ref 22–32)
Calcium: 7.3 mg/dL — ABNORMAL LOW (ref 8.9–10.3)
Calcium: 7.2 mg/dL — ABNORMAL LOW (ref 8.9–10.3)
Chloride: 108 mmol/L (ref 98–111)
Chloride: 109 mmol/L (ref 98–111)
Creatinine, Ser: 0.57 mg/dL — ABNORMAL LOW (ref 0.61–1.24)
Creatinine, Ser: 0.64 mg/dL (ref 0.61–1.24)
GFR, Estimated: >60 mL/min (ref >60)
GFR, Estimated: >60 mL/min (ref >60)
Glucose, Bld: 125 mg/dL — ABNORMAL HIGH (ref 70–99)
Glucose, Bld: 103 mg/dL — ABNORMAL HIGH (ref 70–99)
Potassium: 2.8 mmol/L — ABNORMAL LOW (ref 3.5–5.1)
Potassium: 3.6 mmol/L (ref 3.5–5.1)
Sodium: 144 mmol/L (ref 135–145)
Sodium: 145 mmol/L (ref 135–145)

## 2022-02-28 LAB — CBC
HCT: 36.4 % — ABNORMAL LOW (ref 39.0–52.0)
Hemoglobin: 11.5 g/dL — ABNORMAL LOW (ref 13.0–17.0)
MCH: 31.9 pg (ref 26.0–34.0)
MCHC: 31.6 g/dL (ref 30.0–36.0)
MCV: 101.1 fL — ABNORMAL HIGH (ref 80.0–100.0)
Platelets: 304 10*3/uL (ref 150–400)
RBC: 3.6 MIL/uL — ABNORMAL LOW (ref 4.22–5.81)
RDW: 15.7 % — ABNORMAL HIGH (ref 11.5–15.5)
WBC: 14.5 10*3/uL — ABNORMAL HIGH (ref 4.0–10.5)
nRBC: 0 % (ref 0.0–0.2)

## 2022-02-28 LAB — BLOOD GAS, ARTERIAL
Acid-Base Excess: 7.4 mmol/L — ABNORMAL HIGH (ref 0.0–2.0)
Bicarbonate: 35.6 mmol/L — ABNORMAL HIGH (ref 20.0–28.0)
FIO2: 100 %
O2 Content: 15 L/min
O2 Saturation: 100 %
Patient temperature: 37
pCO2 arterial: 66 mmHg (ref 32–48)
pH, Arterial: 7.34 — ABNORMAL LOW (ref 7.35–7.45)
pO2, Arterial: 217 mmHg — ABNORMAL HIGH (ref 83–108)

## 2022-02-28 LAB — COMPREHENSIVE METABOLIC PANEL
ALT: <5 U/L (ref 0–44)
AST: 14 U/L — ABNORMAL LOW (ref 15–41)
Albumin: 2.3 g/dL — ABNORMAL LOW (ref 3.5–5.0)
Alkaline Phosphatase: 81 U/L (ref 38–126)
Anion gap: 5 (ref 5–15)
BUN: 16 mg/dL (ref 8–23)
CO2: 32 mmol/L (ref 22–32)
Calcium: 7.5 mg/dL — ABNORMAL LOW (ref 8.9–10.3)
Chloride: 106 mmol/L (ref 98–111)
Creatinine, Ser: 0.67 mg/dL (ref 0.61–1.24)
GFR, Estimated: >60 mL/min (ref >60)
Glucose, Bld: 107 mg/dL — ABNORMAL HIGH (ref 70–99)
Potassium: 3.6 mmol/L (ref 3.5–5.1)
Sodium: 143 mmol/L (ref 135–145)
Total Bilirubin: 0.7 mg/dL (ref 0.3–1.2)
Total Protein: 6 g/dL — ABNORMAL LOW (ref 6.5–8.1)

## 2022-02-28 LAB — C-REACTIVE PROTEIN: CRP: 3.9 mg/dL — ABNORMAL HIGH (ref <1.0)

## 2022-02-28 LAB — PROCALCITONIN: Procalcitonin: 0.13 ng/mL

## 2022-02-28 LAB — TROPONIN I (HIGH SENSITIVITY): Troponin I (High Sensitivity): 7 ng/L (ref <18)

## 2022-02-28 LAB — GLUCOSE, CAPILLARY
Glucose-Capillary: 82 mg/dL (ref 70–99)
Glucose-Capillary: 92 mg/dL (ref 70–99)

## 2022-02-28 LAB — LACTIC ACID, PLASMA: Lactic Acid, Venous: 0.9 mmol/L (ref 0.5–1.9)

## 2022-02-28 LAB — SEDIMENTATION RATE: Sed Rate: 66 mm/hr — ABNORMAL HIGH (ref 0–20)

## 2022-02-28 LAB — MAGNESIUM
Magnesium: 1.5 mg/dL — ABNORMAL LOW (ref 1.7–2.4)
Magnesium: 1.5 mg/dL — ABNORMAL LOW (ref 1.7–2.4)

## 2022-02-28 LAB — BRAIN NATRIURETIC PEPTIDE: B Natriuretic Peptide: 641.5 pg/mL — ABNORMAL HIGH (ref 0.0–100.0)

## 2022-02-28 LAB — HEPARIN LEVEL (UNFRACTIONATED): Heparin Unfractionated: 0.24 IU/mL — ABNORMAL LOW (ref 0.30–0.70)

## 2022-02-28 MED ORDER — POTASSIUM CHLORIDE CRYS ER 20 MEQ PO TBCR
40.0000 meq | EXTENDED_RELEASE_TABLET | ORAL | Status: AC
  Administered 2022-02-28: 40 meq via ORAL
  Filled 2022-02-28 (×2): qty 2

## 2022-02-28 MED ORDER — ALBUMIN HUMAN 25 % IV SOLN
12.5000 g | Freq: Once | INTRAVENOUS | Status: AC
  Administered 2022-02-28: 12.5 g via INTRAVENOUS
  Filled 2022-02-28: qty 50

## 2022-02-28 MED ORDER — MAGNESIUM SULFATE 2 GM/50ML IV SOLN
2.0000 g | Freq: Once | INTRAVENOUS | Status: DC
  Filled 2022-02-28 (×2): qty 50

## 2022-02-28 MED ORDER — FUROSEMIDE 10 MG/ML IJ SOLN
40.0000 mg | Freq: Two times a day (BID) | INTRAMUSCULAR | Status: DC
  Administered 2022-03-01 – 2022-03-03 (×5): 40 mg via INTRAVENOUS
  Filled 2022-02-28 (×5): qty 4

## 2022-02-28 MED ORDER — APIXABAN 5 MG PO TABS
5.0000 mg | ORAL_TABLET | Freq: Two times a day (BID) | ORAL | Status: DC
  Administered 2022-02-28 – 2022-03-07 (×15): 5 mg via ORAL
  Filled 2022-02-28 (×15): qty 1

## 2022-02-28 MED ORDER — POTASSIUM CHLORIDE 10 MEQ/100ML IV SOLN
10.0000 meq | INTRAVENOUS | Status: AC
  Administered 2022-02-28 (×2): 10 meq via INTRAVENOUS
  Filled 2022-02-28 (×4): qty 100

## 2022-02-28 MED ORDER — METOPROLOL TARTRATE 25 MG PO TABS
12.5000 mg | ORAL_TABLET | Freq: Three times a day (TID) | ORAL | Status: DC
  Administered 2022-02-28 – 2022-03-02 (×7): 12.5 mg via ORAL
  Filled 2022-02-28 (×7): qty 1

## 2022-02-28 MED ORDER — MAGNESIUM SULFATE IN D5W 1-5 GM/100ML-% IV SOLN
1.0000 g | Freq: Once | INTRAVENOUS | Status: AC
  Administered 2022-02-28: 1 g via INTRAVENOUS
  Filled 2022-02-28: qty 100

## 2022-02-28 MED ORDER — DILTIAZEM HCL ER 60 MG PO CP12
60.0000 mg | ORAL_CAPSULE | Freq: Two times a day (BID) | ORAL | Status: DC
  Administered 2022-02-28 – 2022-03-01 (×3): 60 mg via ORAL
  Filled 2022-02-28 (×3): qty 1

## 2022-02-28 MED ORDER — FUROSEMIDE 10 MG/ML IJ SOLN
20.0000 mg | INTRAMUSCULAR | Status: AC
  Administered 2022-02-28: 20 mg via INTRAVENOUS
  Filled 2022-02-28: qty 2

## 2022-02-28 MED ORDER — MAGNESIUM SULFATE IN D5W 1-5 GM/100ML-% IV SOLN
1.0000 g | Freq: Once | INTRAVENOUS | Status: AC
  Administered 2022-03-01: 1 g via INTRAVENOUS
  Filled 2022-02-28: qty 100

## 2022-02-28 MED ORDER — VITAMIN A 3 MG (10000 UNIT) PO CAPS
10000.0000 [IU] | ORAL_CAPSULE | Freq: Every day | ORAL | Status: DC
  Administered 2022-03-01 – 2022-03-07 (×7): 10000 [IU] via ORAL
  Filled 2022-02-28 (×8): qty 1

## 2022-02-28 MED ORDER — ALBUMIN HUMAN 25 % IV SOLN
12.5000 g | Freq: Once | INTRAVENOUS | Status: AC
  Administered 2022-03-01: 12.5 g via INTRAVENOUS
  Filled 2022-02-28: qty 50

## 2022-02-28 NOTE — Progress Notes (Signed)
   02/28/22 1600  Clinical Encounter Type  Visited With Family  Visit Type Initial;Code  Spiritual Encounters  Spiritual Needs Emotional   Chaplain responded to Rapid Response. Two family members present. Chaplain provided compassionate presence during procedure.

## 2022-02-28 NOTE — Progress Notes (Signed)
PHARMACY CONSULT NOTE FOR:  OUTPATIENT  PARENTERAL ANTIBIOTIC THERAPY (OPAT)  Indication: MSSA bacteremia with R ischial decubitis and sacral osteomyelitis Regimen: Cefazolin 2gm IV q8h End date: 03/23/2022  **Start cephalexin (Keflex) 1gm PO QID 03/24/2022 until 04/05/2022  Labs - Once weekly:  CBC/D, CMP, ESR and CRP Please pull PIC at completion of IV antibiotics Fax weekly lab results  promptly to [phone]  IV antibiotic discharge orders are pended. To discharging provider:  please sign these orders via discharge navigator,  Select New Orders & click on the button choice - Manage This Unsigned Work.     Thank you for allowing pharmacy to be a part of this patient's care.  Doreene Eland, PharmD, BCPS, BCIDP Work Cell: [phone] 02/28/2022 8:56 AM

## 2022-02-28 NOTE — Progress Notes (Signed)
   02/28/22 1900  Clinical Encounter Type  Visited With Family  Visit Type Critical Care  Spiritual Encounters  Spiritual Needs Prayer;Emotional   Chaplain provided follow up care for family of patient after Rapid Response and after patient was transferred to ICU

## 2022-02-28 NOTE — Progress Notes (Signed)
Rapid Response Event Note   Reason for Call : respiratory distress, ams   Initial Focused Assessment: On my arrival, pt is very lethargic and has labored breathing on nonrebreather. Primary RN states pt had been drowsy but oriented and on room air all day until he started c/o shob and sats dropped to the 80s. Then pt became altered and more lethargic. Pt sounds very congested. All vital signs are stable on my arrival but pt remains slightly tachycardic with HR 120s. RT at bedside. Family at bedside.   Interventions: Chest xray and ABG ordered per rapid order set. Dr. Rito Ehrlich notified. Pt has scheduled dose of lasix due at 1800 that was given early. Dr. Rito Ehrlich arrived to bedside and wants pt on bipap and transferred to SD. After discussing pt with intensivist, code stroke was called d/t lethargy and pt was taken for STAT head CT. After head CT completed, pt transferred to ICU 20.   Plan of Care: Pt in ICU 20. Neurology examining pt per teleneuro and stroke coordinator at bedside.    Event Summary:   MD Notified:  Call Time: Arrival Time: End Time:  Henrene Dodge, RN

## 2022-02-28 NOTE — Progress Notes (Signed)
Hospitalists Progress Note  Patient: Jesse Whitney    TGG:[id number]  DOA: 02/20/2022    Date of Service: the patient was seen and examined on 02/28/2022  Brief hospital course: 66 year old male with functional paraplegia presented to the emergency room on 11/21 after being sent over by his PCP for evaluation of high white blood cell count with extended workup revealing septic shock secondary to osteomyelitis of right ischial tuberosity from pressure injury.  Blood cultures positive for MSSA.  Infectious disease following.  Patient underwent TEE on 11/27 which was unremarkable.  PICC line placed 11/28.  Patient noted to have significant scrotal edema and BNP checked found to be elevated and patient started on IV Lasix, with significant response, diuresing almost 5 L and is more than -2 L deficient  Assessment and Plan:   Methicillin-susceptible Staph aureus septic shock secondary to right decubitus ulcer right buttock.   Weaned off of vasopressor.  ID following.  Following TEE, will determine length of time for IV Ancef.  Patient met criteria for septic shock on admission given tachycardia and tachypnea plus persistent hypotension requiring pressor support.   Right tibial plateau fracture status post recent fall -- patient was seen by Dr. Mack Guise orthopedic who recommends no surgical intervention. 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 Dr. Mack Guise recommends follow-up in the office in 1 to 2 weeks to see if patient would be a candidate for telescoping knee brace for immobilization.  As needed pain medication.  Overweight Meets criteria with BMI greater than 25   Acute on chronic a fib with RVR in the setting of sepsis -- will resume Cardizem CD 180 mg plus beta-blocker.  Continue amiodarone.  Heart rate at times still elevated, which may be in part due to heart failure see below.  Now that he is post TEE, will change back to Eliquis.  Cardiology following.      Spinal cord injury with functional quadriplegia and neurogenic bladder and autonomic dysreflexia -- symptomatic care  Acute on chronic systolic heart failure: Echocardiogram in September of this year noted preserved ejection fraction and grade 2 diastolic dysfunction and echocardiogram done 11/23 noted ejection fraction of 35 to 70% with no diastolic dysfunction noted.  TEE notes preserved ejection fraction.  Cardiology with treatment of sepsis and rate controlled atrial fibrillation, this accounts for differences.   Started on IV Lasix plus albumin after noted to have intermittent episodes of borderline hypoxia BNP of 500.  Has diuresed significant amount in less than 24 hours and is still volume overloaded.  Will give more albumin and continue IV Lasix.  Hypomagnesemia/hypokalemia: In part due to diuresis.  Replacing as needed.  Recheck labs in the morning.    Body mass index is 26.86 kg/m.  Nutrition Problem: Increased nutrient needs Etiology: wound healing Pressure Injury 12/11/21 Buttocks Right;Lower Stage 2 -  Partial thickness loss of dermis presenting as a shallow open injury with a red, pink wound bed without slough. (Active)  12/11/21 0610  Location: Buttocks  Location Orientation: Right;Lower  Staging: Stage 2 -  Partial thickness loss of dermis presenting as a shallow open injury with a red, pink wound bed without slough.  Wound Description (Comments):   Present on Admission: Yes     Pressure Injury 02/21/22 Ankle Anterior;Right abrasion (Active)  02/21/22 0346  Location: Ankle  Location Orientation: Anterior;Right  Staging:   Wound Description (Comments): abrasion  Present on Admission: Yes  Dressing Type Gauze (Comment) 02/27/22 2100  Pressure Injury 02/21/22 Ischial tuberosity Right pressure injury; open (Active)  02/21/22 0346  Location: Ischial tuberosity  Location Orientation: Right  Staging:   Wound Description (Comments): pressure injury; open  Present on  Admission: Yes  Dressing Type Foam - Lift dressing to assess site every shift 02/27/22 2100    Pressure ulcers including stage II of the buttocks and right ankle, stage IV ischial tuberosity, all present on admission  Consultants: Cardiology Critical care Infectious disease Orthopedic surgery  Procedures: TEE 11/27 2D echocardiogram done 11/23 noting ejection fraction of 35 to 40%  Antimicrobials: IV Flagyl 11/24-present IV Ancef 11/24-present  Code Status: Full code   Subjective: Denies any complaints, feels better  Objective: Vital signs were reviewed and unremarkable. Vitals:   02/28/22 0738 02/28/22 1200  BP: 127/81 128/87  Pulse: (!) 109 63  Resp: 20 20  Temp: 98.1 F (36.7 C) 98.5 F (36.9 C)  SpO2: 93% 92%    Intake/Output Summary (Last 24 hours) at 02/28/2022 1412 Last data filed at 02/28/2022 1157 Gross per 24 hour  Intake 720 ml  Output 3100 ml  Net -2380 ml    Filed Weights   02/26/22 1353 02/27/22 0500 02/28/22 0549  Weight: 84.8 kg 77.6 kg 77.8 kg   Body mass index is 26.86 kg/m.  Exam:  General: Alert and oriented x 3, no acute distress HEENT: Normocephalic, atraumatic, mucous membranes are moist Cardiovascular:  Irregular rhythm, tachycardic Respiratory: clear to auscultation bilaterally Abdomen: Soft, nontender, nondistended, positive bowel sounds Musculoskeletal: No clubbing or cyanosis, plus pitting edema of the hands and feet Skin: Pressure ulcers as described above.  Scrotal edema still present, but much improved. Psychiatry: Appropriate, no evidence of psychoses Neurology: Spastic paraplegia  Data Reviewed: Potassium of 2.8.  Magnesium of 1.5.  Disposition:  Status is: Inpatient Remains inpatient appropriate because:  Setting up home IV antibiotics Diuresing    Anticipated discharge date: 11/30 or 12/1  Family Communication: Wife at the bedside DVT Prophylaxis: SCDs Start: 02/20/22 2013 apixaban (ELIQUIS) tablet 5 mg     Author: Annita Brod ,MD 02/28/2022 2:12 PM  To reach On-call, see care teams to locate the attending and reach out via [email or web address]. Between 7PM-7AM, please contact night-coverage If you still have difficulty reaching the attending provider, please page the [facility] (Director on Call) for [facility] on amion for assistance.

## 2022-02-28 NOTE — Progress Notes (Signed)
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 noting EF discrepancies (35-40% on TTE 11/21 & 60-65% on TEE 11/27) with Dr. Gwen Pounds (interpreting physician of both of these studies) who suspects that with treatment of sepsis & bacteremia and rate control of his AF reflect the improvement in his EF appropriately.   This patient's plan of care was discussed and created with Dr. Juliann Pares and he is in agreement.  Signed: Rebeca Allegra , PA-C 02/28/2022, 10:50 AM [facility]

## 2022-02-28 NOTE — Code Documentation (Signed)
Stroke Response Nurse Documentation Code Documentation  ULYSESS WITZ is a 66 y.o. male with past medical hx of HTN, quadriplegia following spinal cord injury, autonomic dysreflexia, a-fib, and DVT on Eliquis, currently hospitalized for septic shock On Eliquis (apixaban) daily.   Per primary RN Brittney, patient was last seen at baseline at 1400, and was noted to have acute lethargy after after an episode of oxygen desaturation this afternoon at approx 1520. Code stroke called at physician recommendation.   Patient to brought to CT by patient's primary nurse and charge nurse. Stroke response met patient and nursing staff in CT. Teleneuro cart activated in CT. NIHSS 14, see documentation for details and code stroke times. Patient with decreased LOC, disoriented, bilateral leg weakness, bilateral decreased sensation, and dysarthria  on exam. The following imaging was completed:  CT Head. Patient is not a candidate for IV Thrombolytic due to last dose Eliquis at 10am today, per MD. Patient is not a candidate for IR due to LVO not suspected, per MD.   Care Plan: q2h NIHSS + vital signs. MRI ordered by neurology.  Bedside handoff with ICU RN Roselyn Reef.    Charise Carwin  Stroke Response RN
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
## 2022-02-28 NOTE — Progress Notes (Addendum)
This RN has spoken with Jesse Whitney and his wife to outline the reasons for performing ordered MRI of the brain. Jesse Whitney and wife agree that neither of them prefer that he undergo further testing at this time. Axel Filler, NP, informed. Order to be left in place for now per NP Rochele Raring.

## 2022-02-28 NOTE — Progress Notes (Signed)
   02/28/22 1628  Clinical Encounter Type  Visited With Family  Visit Type Code   Chaplain responded to Code Stroke to provide assistance to family of patient.

## 2022-02-28 NOTE — Progress Notes (Addendum)
Code stroke cart activated at 1626. Pt already in CT scanner at time of activation.  No provider visualized at bedside. Report obtained from [place], [place]. States recognition of symptoms approx 1520 when pt had a desaturation episode and became lethargic. States last seen at baseline was 1400. Dr Roda Shutters paged at 1629- He logged on at 1633 but stated Dr Jerrell Belfast would be joining the cart momentarily. Dr Jerrell Belfast joined cart at [employer]. No TNK per Dr Jerrell Belfast at [phone].  Barista

## 2022-02-28 NOTE — Care Management Important Message (Signed)
Important Message  Patient Details  Name: Jesse Whitney MRN: [id number] Date of Birth: Aug 07, 1955   Medicare Important Message Given:  Yes     Johnell Comings 02/28/2022, 12:24 PM

## 2022-02-28 NOTE — Progress Notes (Signed)
Pts family informed nurse that pt was having difficulty getting up some secretions, went to bedside pt sounded like he was experiencing some congestion suctioned pts mouth encouraged deep breathing, provided pt with an incentive spirometer and flutter valve. Pt wife called out again  an hour later stated pt was having trouble breathing again that he he felt like he was choking, nurse went to bedside attempted same interventions with no relief o2 sats at 94 placed pt on 2 liters called resp to bedside, pt seemed to be more lethargic, while RN and RT at bedside pt sats dropped to 82 increased pts o2 to 6 liters, pt still not recovering became increasingly more lethargic placed on NRB, MD contacted, charge nurse, and ICU nurse contacted. Rapid response paged. MD orders placed

## 2022-02-28 NOTE — TOC Progression Note (Signed)
Transition of Care [facility]) - Progression Note    Patient Details  Name: Jesse Whitney MRN: [id number] Date of Birth: July 24, 1955  Transition of [facility]) CM/SW Contact  Darolyn Rua, [place] Phone Number: 02/28/2022, 12:18 PM  Clinical Narrative:     Per MD patient will need iv lasix another 2 days then dc home with 12 days iv abx.   Patient is set up with [facility] PT OT and RN, Meg informed of need for iv abx at dc.   Jeri Modena with IV infusion updated and will do teaching with patient's wife today.    Expected Discharge Plan: Home/Self Care Barriers to Discharge: Continued Medical Work up  Expected Discharge Plan and Services Expected Discharge Plan: Home/Self Care   Discharge Planning Services: CM Consult Post Acute Care Choice: Home Health Living arrangements for the past 2 months: Single Family Home                           HH Arranged: RN, PT, OT           Social Determinants of Health (SDOH) Interventions    Readmission Risk Interventions     No data to display

## 2022-02-28 NOTE — Progress Notes (Signed)
       CROSS COVER NOTE  NAME: WARNER LADUCA MRN: [id number] DOB : [birth year] ATTENDING PHYSICIAN: Hollice Espy, MD    Date of Service   02/28/2022   HPI/Events of Note   Ordered supplementation partially given today per [facility], unclear why 40K and Mg not give. Will order BMP, Mg now and supplement as needed.  Interventions   Assessment/Plan:  BMP, Mg K Mg Deferred to wife, called wife Victorino Dike at [phone] no answer, HIPAA compliant voicemail left      This document was prepared using Dragon voice recognition software and may include unintentional dictation errors.  Bishop Limbo DNP, MBA, FNP-BC Nurse Practitioner [facility] Pager [phone]
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
## 2022-02-28 NOTE — Progress Notes (Addendum)
On afternoon of 11/29, patient started complaining of difficulty breathing and felt like he was choking.  Initially patient was on room air and then required significant oxygen saturations up to 12 L.  He then became obtunded.  Rapid response called.  Lungs rhoncherous. Chest x-ray noted persistent pleural effusions, no significant worsening.  Stat CT scan of head done noting no evidence of acute CVA.  Lab work ordered noting only mild increase in BNP of 640 despite aggressive diuresis.  White blood cell count mildly increased at 16.9 transferred to [facility]. Reaffirmed with wife that pt is DNR.  Placed on Bipap.  CCM and Neuro following. 45 minutes of critical care time done including medical decision making, bedside examination, interpretation of labs and studies and discussion with specialists.

## 2022-03-01 DIAGNOSIS — I5023 Acute on chronic systolic (congestive) heart failure: Secondary | ICD-10-CM | POA: Diagnosis not present

## 2022-03-01 DIAGNOSIS — R7881 Bacteremia: Secondary | ICD-10-CM | POA: Diagnosis not present

## 2022-03-01 DIAGNOSIS — J9601 Acute respiratory failure with hypoxia: Secondary | ICD-10-CM | POA: Diagnosis not present

## 2022-03-01 DIAGNOSIS — G9341 Metabolic encephalopathy: Secondary | ICD-10-CM | POA: Diagnosis not present

## 2022-03-01 LAB — BASIC METABOLIC PANEL
Anion gap: 5 (ref 5–15)
BUN: 12 mg/dL (ref 8–23)
CO2: 32 mmol/L (ref 22–32)
Calcium: 7.2 mg/dL — ABNORMAL LOW (ref 8.9–10.3)
Chloride: 107 mmol/L (ref 98–111)
Creatinine, Ser: 0.45 mg/dL — ABNORMAL LOW (ref 0.61–1.24)
GFR, Estimated: >60 mL/min (ref >60)
Glucose, Bld: 104 mg/dL — ABNORMAL HIGH (ref 70–99)
Potassium: 3.2 mmol/L — ABNORMAL LOW (ref 3.5–5.1)
Sodium: 144 mmol/L (ref 135–145)

## 2022-03-01 LAB — CBC
HCT: 34.2 % — ABNORMAL LOW (ref 39.0–52.0)
Hemoglobin: 11.3 g/dL — ABNORMAL LOW (ref 13.0–17.0)
MCH: 33.1 pg (ref 26.0–34.0)
MCHC: 33 g/dL (ref 30.0–36.0)
MCV: 100.3 fL — ABNORMAL HIGH (ref 80.0–100.0)
Platelets: 311 10*3/uL (ref 150–400)
RBC: 3.41 MIL/uL — ABNORMAL LOW (ref 4.22–5.81)
RDW: 15.3 % (ref 11.5–15.5)
WBC: 15 10*3/uL — ABNORMAL HIGH (ref 4.0–10.5)
nRBC: 0.1 % (ref 0.0–0.2)

## 2022-03-01 LAB — PROCALCITONIN: Procalcitonin: 0.1 ng/mL

## 2022-03-01 LAB — VITAMIN C: Vitamin C: 0.6 mg/dL (ref 0.4–2.0)

## 2022-03-01 LAB — MAGNESIUM: Magnesium: 1.9 mg/dL (ref 1.7–2.4)

## 2022-03-01 MED ORDER — POTASSIUM CHLORIDE CRYS ER 20 MEQ PO TBCR
40.0000 meq | EXTENDED_RELEASE_TABLET | Freq: Once | ORAL | Status: AC
  Administered 2022-03-01: 40 meq via ORAL
  Filled 2022-03-01: qty 2

## 2022-03-01 MED ORDER — DILTIAZEM HCL 60 MG PO TABS: 60.0000 mg | ORAL_TABLET | Freq: Once | ORAL | Status: DC

## 2022-03-01 MED ORDER — DILTIAZEM HCL ER 90 MG PO CP12
90.0000 mg | ORAL_CAPSULE | Freq: Two times a day (BID) | ORAL | Status: DC
  Administered 2022-03-01 – 2022-03-05 (×8): 90 mg via ORAL
  Filled 2022-03-01 (×8): qty 1

## 2022-03-01 NOTE — Progress Notes (Signed)
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 Problems:   Septic shock (HCC)   Decubitus ulcer of ischial area, right, stage II (HCC)   Osteomyelitis (HCC)   Permanent atrial fibrillation (HCC)   MSSA (methicillin susceptible  Staphylococcus aureus)   Bacteremia due to methicillin susceptible Staphylococcus aureus (MSSA)    ASSESSMENT AND PLAN:  Roger ShelterGordon "Romeo AppleBen" Leo RodMoreland is a 66yo paraplegic male with a PMH of SCI (C5-C7), autonomic dysreflexia, neurogenic bladder (self catheterizes 4x daily), 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  He is being treated for septic shock 2/2 right ischial decubitus ulcer. Blood cultures positive for MSSA.  Hospital course has been complicated by A-fib with RVR. TEE performed 11/27 was negative for vegetations, ruling out endocarditis.  # sepsis 2/2 MSSA bacteremia, right ischial decubitus ulcer  Infectious disease following, currently on cefazolin and Flagyl.  TEE negative for vegetations.  # paroxysmal  AF RVR In the setting of sepsis and bacteremia.  Rate better controlled on 11/28 during the day, but back to sustaining 120s-130s on 11/29. -monitor and replete electrolytes for K>4 and Mg>2 -continue metoprolol 12.5mg  three times daily  -s/p IV amio load and gtt, PO amiodarone 200 twice a day for 4 days. Continue amio PO 200mg  once daily.  -continue midodrine 10mg  TID, wean as tolerated -increase diltiazem to 90mg  SR BID  -s/p heparin gtt. continue eliquis 5mg  BID. CHADS2-VASC 2 (hx DVT)   # acute on chronic HFpEF  S/p rapid response the afternoon of 11/29 where patient's breathing was labored and was hypoxic on room air to 80s. Improved following lasix administration and was transferred to stepdown. CT head unremarkable, patient and wife refused MRI.  - agree with continuing lasix 40mg  IV BID, diuresing well. Almost to a net even I/O   - discussed with Dr. Rito EhrlichKrishnan after reviewing TTE and TEE reports noting EF discrepancies (35-40% on TTE 11/21 & 60-65% on TEE 11/27) with Dr. Gwen PoundsKowalski (interpreting physician of both of these studies) who suspects that with treatment of sepsis & bacteremia and rate control of his AF reflect the improvement in his EF appropriately.   This patient's plan of care was discussed and created with Dr. Juliann Paresallwood and he is in agreement.  Signed: Rebeca AllegraLily Michelle Azia Toutant , PA-C 03/01/2022, 1:07 PM [facility]

## 2022-03-01 NOTE — Progress Notes (Signed)
Occupational Therapy Treatment Patient Details Name: Jesse Whitney MRN: [id number] DOB: May 21, 1955 Today's Date: 03/01/2022   History of present illness 66yo M w/ h/o functional quadriplegia, h/o DVT, syringomyelia, Aflutter, peripheral neuropathy, BPH, DJD of shoulder, & neurogenic bladder presenting for evaluation of   Leukocytosis from PCP w/u for RLE c/f cellulitis.   OT comments  Pt seen for skilled co-treatment with PT. Pt is alert and agreeable to therapeutic intervention. Pt is much more interactive with cognition appearing to be much clearer this session. PROM to B LEs in all available planes while discussing plans for this session. Pt demonstrating roll for bed mobility with noted BM. RN arriving to change dressing as well as full linen change. Pt  with multiple rolls L <> R with max A and second perform performing hygiene needs. Pt motivated to continue and demonstrates supine >sit with mod of 2 for B LEs and trunk support. Pt putting forth great effort this session. Pt maintaining static sitting balance for ~ 5 minutes with balance requiring mod A with posterior bias progressing to bouts of CGA with pt changing hand placement. Pt making great progress towards therapy goals and motivated. OT recommendation changed to CIR as pt is far from baseline of mod I level. Pt will continue to benefit from OT intervention.    Recommendations for follow up therapy are one component of a multi-disciplinary discharge planning process, led by the attending physician.  Recommendations may be updated based on patient status, additional functional criteria and insurance authorization.    Follow Up Recommendations  Acute inpatient rehab (3hours/day)     Assistance Recommended at Discharge Frequent or constant Supervision/Assistance  Patient can return home with the following  Two people to help with walking and/or transfers;Assist for transportation;Help with stairs or ramp for entrance;Two people to  help with bathing/dressing/bathroom   Equipment Recommendations  Other (comment) (defer to next venue of care)       Precautions / Restrictions Precautions Precautions: Fall Precaution Comments: hx of SCI Restrictions Weight Bearing Restrictions: No       Mobility Bed Mobility Overal bed mobility: Needs Assistance Bed Mobility: Supine to Sit, Sit to Supine, Rolling Rolling: Min assist, Mod assist, +2 for physical assistance   Supine to sit: Mod assist, +2 for physical assistance, +2 for safety/equipment Sit to supine: Max assist, +2 for physical assistance, +2 for safety/equipment   General bed mobility comments: Pt able to verbalize sequence for functional mobility and hooks L UE to bed rail similar to home environment.    Transfers                   General transfer comment: not attempted this session     Balance Overall balance assessment: Needs assistance Sitting-balance support: Feet supported Sitting balance-Leahy Scale: Poor Sitting balance - Comments: mod A progressing to brief periods of CGA                                   ADL either performed or assessed with clinical judgement   ADL Overall ADL's : Needs assistance/impaired                     Lower Body Dressing: Total assistance Lower Body Dressing Details (indicate cue type and reason): to don B socks               General ADL Comments: total A for  hygiene with +2 assistance to roll pt and perform linen change with hygiene    Extremity/Trunk Assessment Upper Extremity Assessment Upper Extremity Assessment: Generalized weakness   Lower Extremity Assessment RLE Deficits / Details: pt with hx of SCI and has no volitional movement or muscle activation throughout his LEs LLE Deficits / Details: pt with hx of SCI and has no volitional movement or muscle activation throughout his LEs        Vision Patient Visual Report: No change from baseline             Cognition Arousal/Alertness: Awake/alert Behavior During Therapy: WFL for tasks assessed/performed Overall Cognitive Status: Impaired/Different from baseline Area of Impairment: Safety/judgement, Awareness                       Following Commands: Follows one step commands consistently Safety/Judgement: Decreased awareness of deficits, Decreased awareness of safety Awareness: Emergent Problem Solving: Slow processing, Requires verbal cues, Requires tactile cues General Comments: cognition seems more clear this session and pt is more interactive and ready to participate in therapeutic intervention.                   Pertinent Vitals/ Pain       Pain Assessment Pain Assessment: No/denies pain         Frequency  Min 4X/week        Progress Toward Goals  OT Goals(current goals can now be found in the care plan section)  Progress towards OT goals: Progressing toward goals  Acute Rehab OT Goals Patient Stated Goal: to go to rehab before returning home OT Goal Formulation: With patient/family Time For Goal Achievement: 03/02/2022 Potential to Achieve Goals: [place] Discharge plan remains appropriate;Frequency needs to be updated    Co-evaluation    PT/OT/SLP Co-Evaluation/Treatment: Yes Reason for Co-Treatment: Complexity of the patient's impairments (multi-system involvement);For patient/therapist safety;To address functional/ADL transfers PT goals addressed during session: Mobility/safety with mobility;Balance OT goals addressed during session: ADL's and self-care      AM-PAC OT "6 Clicks" Daily Activity     Outcome Measure   Help from another person eating meals?: A Lot Help from another person taking care of personal grooming?: Total Help from another person toileting, which includes using toliet, bedpan, or urinal?: Total Help from another person bathing (including washing, rinsing, drying)?: Total Help from another person to put on and taking  off regular upper body clothing?: Total Help from another person to put on and taking off regular lower body clothing?: Total 6 Click Score: 7    End of Session    OT Visit Diagnosis: Unsteadiness on feet (R26.81);Repeated falls (R29.6);Muscle weakness (generalized) (M62.81);History of falling (Z91.81)   Activity Tolerance Patient tolerated treatment well;Patient limited by fatigue   Patient Left in bed;with call bell/phone within reach;with bed alarm set   Nurse Communication Mobility status        Time: [id number] OT Time Calculation (min): 47 min  Charges: OT General Charges $OT Visit: 1 Visit OT Treatments $Therapeutic Activity: 8-22 mins  Darleen Crocker, MS, OTR/L , CBIS ascom [phone]  03/01/22, 4:04 PM

## 2022-03-01 NOTE — Progress Notes (Signed)
Attempted abg x 2, patient refused. Appropriate provider is aware.

## 2022-03-01 NOTE — Progress Notes (Signed)
Physical Therapy Treatment Patient Details Name: Jesse Whitney MRN: [id number] DOB: 12-Jul-1955 Today's Date: 03/01/2022   History of Present Illness 66yo M w/ h/o functional quadriplegia, h/o DVT, syringomyelia, Aflutter, peripheral neuropathy, BPH, DJD of shoulder, & neurogenic bladder presenting for evaluation of   Leukocytosis from PCP w/u for RLE c/f cellulitis.  Hospital stay complicated by episode of respiratory distress requiring transfer to CCU (02/28/22); attributed to metabolic encephalopathy and mucous plug.    PT Comments    Patient resting with wife, sister at bedside in CCU; cleared for participation with session per primary RN.  Reports significant improvement in alertness, overall mentation and respiratory status throughout the day.  Patient oriented to basic information, follows simple commands and eager for participation with session.  Voices frustration with current functional status and variance from baseline ability. Does actively participate with all aspects of mobility this date, completing rolling bilat with mod assist +2; supine/sit with max assist +2; unsupported sitting balance with mod progressing to brief periods of cga for static balance.  Optimal performance achieved when patient allowed to simulate baseline movement patterns and environmental set up. Marked improvement in functional performance, participation and overall rehab potential since initial evaluation.  Given improvement, discharge recommendations updated to AIR.  Patient motivated to return to baseline level of ability; has equipment and environmental set up necessary to promote safe discharge plan post-rehab; good potential for functional progress with access to appropriate post-acute rehab.  Patient in agreement with recommendation and would like to consider.  Goal for next session: OOB to chair.    Recommendations for follow up therapy are one component of a multi-disciplinary discharge planning  process, led by the attending physician.  Recommendations may be updated based on patient status, additional functional criteria and insurance authorization.  Follow Up Recommendations  Acute inpatient rehab (3hours/day) Can patient physically be transported by private vehicle: No   Assistance Recommended at Discharge Frequent or constant Supervision/Assistance  Patient can return home with the following Two people to help with walking and/or transfers;Two people to help with bathing/dressing/bathroom;Assistance with cooking/housework;Assistance with feeding;Assist for transportation;Direct supervision/assist for medications management;Direct supervision/assist for financial management   Equipment Recommendations       Recommendations for Other Services       Precautions / Restrictions Precautions Precautions: Fall Precaution Comments: hx of SCI Restrictions Weight Bearing Restrictions: No     Mobility  Bed Mobility Overal bed mobility: Needs Assistance Bed Mobility: Supine to Sit, Rolling Rolling: Min assist, Mod assist, +2 for physical assistance   Supine to sit: Mod assist, +2 for physical assistance, +2 for safety/equipment Sit to supine: Max assist, +2 for physical assistance, +2 for safety/equipment   General bed mobility comments: Pt able to verbalize sequence for functional mobility and hooks L UE to bed rail similar to home environment.  Assist to rotate/mobilize contralateral LE with rolling efforts.  Unable to mobilize LEs towards edge of bed with supine/sit due to global weakness; may benefit from use of leg lifters to assist    Transfers                   General transfer comment: not attempted this session; emphasis on bed mobility and sitting balance    Ambulation/Gait               General Gait Details: not attempted this session; emphasis on bed mobility and sitting balance   Stairs  Wheelchair Mobility    Modified  Rankin (Stroke Patients Only)       Balance Overall balance assessment: Needs assistance Sitting-balance support: Feet supported, Bilateral upper extremity supported Sitting balance-Leahy Scale: Poor Sitting balance - Comments: mod A progressing to brief periods of CGA; fair/good efforts to utilie UEs on bed to assist with stabilization, but lacks strength to complete indep                                    Cognition Arousal/Alertness: Awake/alert Behavior During Therapy: [facility] for tasks assessed/performed                                   General Comments: Improving awareness of deficits and change from baseline abilities.  Consistently following commands, appropriately interacting with therapist        Exercises Other Exercises Other Exercises: Rolling bilat, min/mod assist, for peri-care, dressing change, linen/gown change.  Dep for hygiene (incontinent bowel) with basent sensory awareness to perineal/rectal area.  Patient somewhat frustrated by poor bowel control, as it is not consistent with his bowel habits/program at home Other Exercises: Lateral leaning in unsupported sitting for pressure relief and to simulate weight shift/trunk control required to place SB.  Improved control R > L, but requiring mod assist to safely complete and correct bilat. Supine LE therex, 1x10, passive ROM to bilat hips, knees and ankles.  Absent active movement, full sensory loss appreciated.  Significant pitting edema throughout bilat LEs (3+) with bruising to R knee, distal LE and ankle.  Bilat LEs lacking full extension (baseline for patient per report) Also noted with fluid-filled blister to medial L calcaneous; RN informed/aware and applied meplix for pressure reduction to area.    General Comments        Pertinent Vitals/Pain Pain Assessment Pain Assessment: No/denies pain    Home Living                          Prior Function            PT Goals  (current goals can now be found in the care plan section) Acute Rehab PT Goals Patient Stated Goal: to return to baseline PT Goal Formulation: With patient Time For Goal Achievement: Mar 25, 2022 Potential to Achieve Goals: Fair Progress towards PT goals: Progressing toward goals    Frequency    Min 2X/week      PT Plan Discharge plan needs to be updated    Co-evaluation PT/OT/SLP Co-Evaluation/Treatment: Yes Reason for Co-Treatment: Complexity of the patient's impairments (multi-system involvement);For patient/therapist safety;To address functional/ADL transfers PT goals addressed during session: Mobility/safety with mobility;Balance OT goals addressed during session: ADL's and self-care      AM-PAC PT "6 Clicks" Mobility   Outcome Measure  Help needed turning from your back to your side while in a flat bed without using bedrails?: A Lot Help needed moving from lying on your back to sitting on the side of a flat bed without using bedrails?: A Lot Help needed moving to and from a bed to a chair (including a wheelchair)?: Total Help needed standing up from a chair using your arms (e.g., wheelchair or bedside chair)?: Total Help needed to walk in hospital room?: Total Help needed climbing 3-5 steps with a railing? : Total 6 Click Score: 8  End of Session   Activity Tolerance: Patient tolerated treatment well Patient left: in bed;with call bell/phone within reach Nurse Communication: Mobility status PT Visit Diagnosis: Other abnormalities of gait and mobility (R26.89);Other symptoms and signs involving the nervous system [id number])     Time: [id number] PT Time Calculation (min) (ACUTE ONLY): 47 min  Charges:  $Therapeutic Activity: 8-22 mins $Neuromuscular Re-education: 8-22 mins                     Melvern Ramone H. Owens Shark, PT, DPT, NCS 03/01/22, 10:02 PM [phone]

## 2022-03-01 NOTE — Progress Notes (Signed)
Hospitalists Progress Note  Patient: Jesse Whitney    PFX:[id number]  DOA: 02/20/2022    Date of Service: the patient was seen and examined on 03/01/2022  Brief hospital course: 66 year old male with functional paraplegia presented to the emergency room on 11/21 after being sent over by his PCP for evaluation of high white blood cell count with extended workup revealing septic shock secondary to osteomyelitis of right ischial tuberosity from pressure injury.  Blood cultures positive for MSSA.  Infectious disease following.  Patient underwent TEE on 11/27 which was unremarkable.  PICC line placed 11/28.  Patient noted to have significant scrotal edema and BNP checked found to be elevated and patient started on IV Lasix, with significant response, diuresing almost 5 L and is more than -2 L deficient.  On late afternoon of 11/29, patient went into significant respiratory distress requiring strong supplemental oxygen and having mental status change.  Stroke ruled out by CT.  Patient transferred to ICU.  Chest x-ray not showing significant increase in fluid.  That night, with respiratory therapy help, patient coughed up large volume mucous plug and after that, breathing significantly improved and by morning of 11/30, able to be weaned off of oxygen altogether.  Mentation normalized.  Assessment and Plan:   Methicillin-susceptible Staph aureus septic shock secondary to right decubitus ulcer right buttock.  Septic shock resolved. Weaned off of vasopressor.  ID following.  Following TEE, will determine length of time for IV Ancef.  Patient met criteria for septic shock on admission given tachycardia and tachypnea plus persistent hypotension requiring pressor support.  Acute on chronic respiratory failure with hypoxia-resolved Felt to be secondary to mucous plug.  Continue to monitor.  Will move patient out of ICU now that he is much more stable.  Acute metabolic encephalopathy-resolved Secondary to  hypoxia brought on by mucous plug.  With improvement in oxygenation, encephalopathy has resolved   Right tibial plateau fracture status post recent fall -- patient was seen by Dr. Mack Guise orthopedic who recommends no surgical intervention. 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 Dr. Mack Guise recommends follow-up in the office in 1 to 2 weeks to see if patient would be a candidate for telescoping knee brace for immobilization.  As needed pain medication.  Overweight Meets criteria with BMI greater than 25   Acute on chronic a fib with RVR in the setting of sepsis -- will resume Cardizem CD 180 mg plus beta-blocker.  Continue amiodarone.  Heart rate at times still elevated, which may be in part due to heart failure see below.  Now that he is post TEE, will change back to Eliquis.  Cardiology following.     Spinal cord injury with functional quadriplegia and neurogenic bladder and autonomic dysreflexia -- symptomatic care  Acute on chronic systolic heart failure: Echocardiogram in September of this year noted preserved ejection fraction and grade 2 diastolic dysfunction and echocardiogram done 11/23 noted ejection fraction of 35 to 32% with no diastolic dysfunction noted.  TEE notes preserved ejection fraction.  Cardiology with treatment of sepsis and rate controlled atrial fibrillation, this accounts for differences.   Started on IV Lasix plus albumin after noted to have intermittent episodes of borderline hypoxia BNP of 500.  He continues to respond well, diuresing almost 10 L of fluid.  Hypomagnesemia/hypokalemia: In part due to diuresis.  Replacing as needed.  Recheck labs in the morning.    Body mass index is 27.42 kg/m.  Nutrition Problem: Increased nutrient needs Etiology: wound  healing Pressure Injury 12/11/21 Buttocks Right;Lower Stage 2 -  Partial thickness loss of dermis presenting as a shallow open injury with a red, pink wound bed without slough. (Active)   12/11/21 0610  Location: Buttocks  Location Orientation: Right;Lower  Staging: Stage 2 -  Partial thickness loss of dermis presenting as a shallow open injury with a red, pink wound bed without slough.  Wound Description (Comments):   Present on Admission: Yes     Pressure Injury 02/21/22 Ankle Anterior;Right abrasion (Active)  02/21/22 0346  Location: Ankle  Location Orientation: Anterior;Right  Staging:   Wound Description (Comments): abrasion  Present on Admission: Yes  Dressing Type Foam - Lift dressing to assess site every shift 03/01/22 0820     Pressure Injury 02/21/22 Ischial tuberosity Right pressure injury; open (Active)  02/21/22 0346  Location: Ischial tuberosity  Location Orientation: Right  Staging:   Wound Description (Comments): pressure injury; open  Present on Admission: Yes  Dressing Type Foam - Lift dressing to assess site every shift 03/01/22 0820    Pressure ulcers including stage II of the buttocks and right ankle, stage IV ischial tuberosity, all present on admission  Consultants: Cardiology Critical care Infectious disease Orthopedic surgery Neurology  Procedures: TEE 11/27 2D echocardiogram done 11/23 noting ejection fraction of 35 to 40%  Antimicrobials: IV Flagyl 11/24-present IV Ancef 11/24-present  Code Status: Full code   Subjective: Patient feels much better from previous day.  Objective: Vital signs were reviewed and unremarkable. Vitals:   03/01/22 1600 03/01/22 1700  BP:  (!) 157/111  Pulse: (!) 106 (!) 119  Resp: 20 17  Temp:  98.9 F (37.2 C)  SpO2: 96% 100%    Intake/Output Summary (Last 24 hours) at 03/01/2022 1758 Last data filed at 03/01/2022 1714 Gross per 24 hour  Intake 2217.56 ml  Output 4630 ml  Net -2412.44 ml    Filed Weights   02/28/22 0549 02/28/22 1650 03/01/22 0600  Weight: 77.8 kg 77.8 kg 79.4 kg   Body mass index is 27.42 kg/m.  Exam:  General: Alert and oriented x 3, no acute  distress HEENT: Normocephalic, atraumatic, mucous membranes are moist Cardiovascular:  Irregular rhythm, tachycardic Respiratory: clear to auscultation bilaterally Abdomen: Soft, nontender, nondistended, positive bowel sounds Musculoskeletal: No clubbing or cyanosis, plus pitting edema of the hands and feet Skin: Pressure ulcers as described above.  Scrotal edema still present, but much improved. Psychiatry: Appropriate, no evidence of psychoses Neurology: Spastic paraplegia  Data Reviewed: Potassium 3.2.  White blood cell count of 15.  Disposition:  Status is: Inpatient Remains inpatient appropriate because:  Setting up home IV antibiotics Fully diuresing -Ensure stabilization    Anticipated discharge date: 12/3  Family Communication: Wife at the bedside DVT Prophylaxis: SCDs Start: 02/20/22 2013 apixaban (ELIQUIS) tablet 5 mg    Author: Annita Brod ,MD 03/01/2022 5:58 PM  To reach On-call, see care teams to locate the attending and reach out via [email or web address]. Between 7PM-7AM, please contact night-coverage If you still have difficulty reaching the attending provider, please page the [facility] (Director on Call) for [facility] on amion for assistance.
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
## 2022-03-02 DIAGNOSIS — R7881 Bacteremia: Secondary | ICD-10-CM | POA: Diagnosis not present

## 2022-03-02 DIAGNOSIS — R6521 Severe sepsis with septic shock: Secondary | ICD-10-CM | POA: Diagnosis not present

## 2022-03-02 DIAGNOSIS — A419 Sepsis, unspecified organism: Secondary | ICD-10-CM | POA: Diagnosis not present

## 2022-03-02 DIAGNOSIS — I4821 Permanent atrial fibrillation: Secondary | ICD-10-CM | POA: Diagnosis not present

## 2022-03-02 DIAGNOSIS — M8618 Other acute osteomyelitis, other site: Secondary | ICD-10-CM | POA: Diagnosis not present

## 2022-03-02 DIAGNOSIS — L89312 Pressure ulcer of right buttock, stage 2: Secondary | ICD-10-CM | POA: Diagnosis not present

## 2022-03-02 LAB — CBC
HCT: 36.6 % — ABNORMAL LOW (ref 39.0–52.0)
Hemoglobin: 11.6 g/dL — ABNORMAL LOW (ref 13.0–17.0)
MCH: 32 pg (ref 26.0–34.0)
MCHC: 31.7 g/dL (ref 30.0–36.0)
MCV: 100.8 fL — ABNORMAL HIGH (ref 80.0–100.0)
Platelets: 359 10*3/uL (ref 150–400)
RBC: 3.63 MIL/uL — ABNORMAL LOW (ref 4.22–5.81)
RDW: 15.5 % (ref 11.5–15.5)
WBC: 16.1 10*3/uL — ABNORMAL HIGH (ref 4.0–10.5)
nRBC: 0 % (ref 0.0–0.2)

## 2022-03-02 LAB — BASIC METABOLIC PANEL
Anion gap: 7 (ref 5–15)
BUN: 9 mg/dL (ref 8–23)
CO2: 32 mmol/L (ref 22–32)
Calcium: 7.1 mg/dL — ABNORMAL LOW (ref 8.9–10.3)
Chloride: 103 mmol/L (ref 98–111)
Creatinine, Ser: 0.48 mg/dL — ABNORMAL LOW (ref 0.61–1.24)
GFR, Estimated: >60 mL/min (ref >60)
Glucose, Bld: 98 mg/dL (ref 70–99)
Potassium: 3 mmol/L — ABNORMAL LOW (ref 3.5–5.1)
Sodium: 142 mmol/L (ref 135–145)

## 2022-03-02 LAB — MAGNESIUM: Magnesium: 1.5 mg/dL — ABNORMAL LOW (ref 1.7–2.4)

## 2022-03-02 MED ORDER — METOPROLOL TARTRATE 25 MG PO TABS
25.0000 mg | ORAL_TABLET | Freq: Two times a day (BID) | ORAL | Status: DC
  Administered 2022-03-02 – 2022-03-07 (×8): 25 mg via ORAL
  Filled 2022-03-02 (×10): qty 1

## 2022-03-02 MED ORDER — ORAL CARE MOUTH RINSE: 15.0000 mL | OROMUCOSAL | Status: DC | PRN

## 2022-03-02 MED ORDER — POTASSIUM CHLORIDE CRYS ER 20 MEQ PO TBCR
40.0000 meq | EXTENDED_RELEASE_TABLET | Freq: Two times a day (BID) | ORAL | Status: AC
  Administered 2022-03-02 (×2): 40 meq via ORAL
  Filled 2022-03-02 (×2): qty 2

## 2022-03-02 MED ORDER — CEFAZOLIN SODIUM-DEXTROSE 2-4 GM/100ML-% IV SOLN
2.0000 g | Freq: Three times a day (TID) | INTRAVENOUS | Status: DC
  Administered 2022-03-02 – 2022-03-05 (×9): 2 g via INTRAVENOUS
  Filled 2022-03-02 (×10): qty 100

## 2022-03-02 MED ORDER — MAGNESIUM SULFATE 2 GM/50ML IV SOLN
2.0000 g | Freq: Once | INTRAVENOUS | Status: AC
  Administered 2022-03-02: 2 g via INTRAVENOUS
  Filled 2022-03-02: qty 50

## 2022-03-02 MED ORDER — METRONIDAZOLE 500 MG PO TABS
500.0000 mg | ORAL_TABLET | Freq: Two times a day (BID) | ORAL | Status: DC
  Administered 2022-03-02 – 2022-03-05 (×6): 500 mg via ORAL
  Filled 2022-03-02 (×7): qty 1

## 2022-03-02 MED ORDER — ORAL CARE MOUTH RINSE
15.0000 mL | OROMUCOSAL | Status: DC
  Administered 2022-03-03: 15 mL via OROMUCOSAL

## 2022-03-02 MED ORDER — FLUCONAZOLE 100 MG PO TABS
200.0000 mg | ORAL_TABLET | Freq: Every day | ORAL | Status: DC
  Administered 2022-03-02 – 2022-03-07 (×6): 200 mg via ORAL
  Filled 2022-03-02 (×6): qty 2

## 2022-03-02 MED ORDER — SODIUM CHLORIDE 0.9 % IV SOLN
3.0000 g | Freq: Four times a day (QID) | INTRAVENOUS | Status: DC
  Administered 2022-03-02: 3 g via INTRAVENOUS
  Filled 2022-03-02 (×2): qty 8

## 2022-03-02 NOTE — Progress Notes (Signed)
PROGRESS NOTE    Jesse Whitney  ZOX:[id number] DOB: 1955-12-12 DOA: 02/20/2022 PCP: [name], PA-C    Brief Narrative:  66 year old male with functional paraplegia presented to the emergency room on 11/21 after being sent over by his PCP for evaluation of high white blood cell count with extended workup revealing septic shock secondary to osteomyelitis of right ischial tuberosity from pressure injury.  Blood cultures positive for MSSA.  Infectious disease following.  Patient underwent TEE on 11/27 which was unremarkable.  PICC line placed 11/28.  Patient noted to have significant scrotal edema and BNP checked found to be elevated and patient started on IV Lasix, with significant response, diuresing almost 5 L and is more than -2 L deficient.   On late afternoon of 11/29, patient went into significant respiratory distress requiring strong supplemental oxygen and having mental status change.  Stroke ruled out by CT.  Patient transferred to ICU.  Chest x-ray not showing significant increase in fluid.  That night, with respiratory therapy help, patient coughed up large volume mucous plug and after that, breathing significantly improved and by morning of 11/30, able to be weaned off of oxygen altogether.  Mentation normalized.  12/1: Patient remains in rapid atrial fibrillation.  Volume status improved.  Of note patient was upset this morning.  Per patient and wife he was left overnight without a bear hugger.  Unclear true etiology of events.  Patient's mental status has been somewhat waxing and waning.   Assessment & Plan:   Active Problems:   Septic shock (HCC)   Decubitus ulcer of ischial area, right, stage II (HCC)   Osteomyelitis (HCC)   Permanent atrial fibrillation (HCC)   MSSA (methicillin susceptible Staphylococcus aureus)   Bacteremia due to methicillin susceptible Staphylococcus aureus (MSSA)  Methicillin-susceptible Staph aureus septic shock secondary to right decubitus  ulcer right buttock.  Septic shock resolved. Weaned off of vasopressor.  ID following.  Following TEE, will determine length of time for IV Ancef.  Patient met criteria for septic shock on admission given tachycardia and tachypnea plus persistent hypotension requiring pressor support.  Continue Ancef for now   Acute on chronic respiratory failure with hypoxia-resolved Felt to be secondary to mucous plug.  Continue to monitor.  Patient for for PCU status.  He is on room air   Acute metabolic encephalopathy Secondary to hypoxia brought on by mucous plug.  Somewhat agitated at times.  Possible hospital-acquired delirium.  Transfer to PCU.  Avoid nonessential sedatives.   Right tibial plateau fracture status post recent fall -- patient was seen by Dr. Mack Guise orthopedic who recommends no surgical intervention. 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 Dr. Mack Guise recommends follow-up in the office in 1 to 2 weeks to see if patient would be a candidate for telescoping knee brace for immobilization.  As needed pain medication.   Overweight Meets criteria with BMI greater than 25   Acute on chronic a fib with RVR in the setting of sepsis Rate control has been difficult to achieve.  Will continue Cardizem CD 180 mg daily.  Amiodarone.  Metoprolol.  Eliquis.  Continue telemetry monitoring for now   Spinal cord injury with functional quadriplegia and neurogenic bladder and autonomic dysreflexia -- symptomatic care   Acute on chronic systolic heart failure: Echocardiogram in September of this year noted preserved ejection fraction and grade 2 diastolic dysfunction and echocardiogram done 11/23 noted ejection fraction of 35 to 81% with no diastolic dysfunction noted.  TEE notes  preserved ejection fraction.  Cardiology with treatment of sepsis and rate controlled atrial fibrillation, this accounts for differences.    Started on IV Lasix plus albumin after noted to have  intermittent episodes of borderline hypoxia BNP of 500.  He continues to respond well, diuresing almost 10 L of fluid.  Patient is approaching at 0.  Continued IV diuresis for now   Hypomagnesemia/hypokalemia: In part due to diuresis.  Replacing as needed.  Recheck labs in the morning.   DVT prophylaxis: Eliquis Code Status: DNR Family Communication: Spouse at bedside 12/1 Disposition Plan: Status is: Inpatient Remains inpatient appropriate because: Septic shock, resolving on IV antibiotics   Level of care: Progressive  Consultants:  ID Neurology  Procedures:  TEE  Antimicrobials: Ancef   Subjective: Seen and examined.  Wife at bedside.  Patient flattened affect but does answer questions appropriately.  Objective: Vitals:   03/02/22 0500 03/02/22 0600 03/02/22 0700 03/02/22 0900  BP: 110/76 110/76 (!) 108/91 110/85  Pulse: (!) 116 (!) 102 (!) 123 98  Resp: (!) 21 (!) 27 (!) 31 (!) 25  Temp:    98.7 F (37.1 C)  TempSrc:    Oral  SpO2: 94% 93% 94% 98%  Weight:      Height:        Intake/Output Summary (Last 24 hours) at 03/02/2022 1243 Last data filed at 03/02/2022 1200 Gross per 24 hour  Intake 459.77 ml  Output 3525 ml  Net -3065.23 ml   Filed Weights   02/28/22 1650 03/01/22 0600 03/02/22 0200  Weight: 77.8 kg 79.4 kg 77.4 kg    Examination:  General exam: NAD.  Appears frail and chronically ill Respiratory system: Lungs clear.  Normal work of breathing.  Room air Cardiovascular system: S1-S2, tachycardic, irregular rate, no murmurs, pitting edema of hands and feet Gastrointestinal system: Soft, NT/ND, normal bowel sounds. Central nervous system: Spastic paraplegia Extremities: Symmetric 5 x 5 power. Skin: Stage IV sacral decubitus ulcer Psychiatry: Judgement and insight appear normal. Mood & affect appropriate.     Data Reviewed: I have personally reviewed following labs and imaging studies  CBC: Recent Labs  Lab 02/27/22 0734 02/28/22 0815  02/28/22 1658 03/01/22 0552 03/02/22 0921  WBC 15.2* 14.5* 16.9* 15.0* 16.1*  NEUTROABS  --   --  14.9*  --   --   HGB 11.2* 11.5* 12.2* 11.3* 11.6*  HCT 34.7* 36.4* 38.9* 34.2* 36.6*  MCV 100.6* 101.1* 102.4* 100.3* 100.8*  PLT 317 304 363 311 244   Basic Metabolic Panel: Recent Labs  Lab 02/24/22 0555 02/25/22 0539 02/26/22 0431 02/28/22 0815 02/28/22 1658 02/28/22 2100 03/01/22 0552 03/02/22 0921  NA 145  --  144 144 143 145 144 142  K 4.3  --  3.7 2.8* 3.6 3.6 3.2* 3.0*  CL 112*  --  109 108 106 109 107 103  CO2 23  --  27 28 32 31 32 32  GLUCOSE 169*  --  92 125* 107* 103* 104* 98  BUN 22  --  24* _0 CREATININE 0.87  --  0.80 0.57* 0.67 0.64 0.45* 0.48*  CALCIUM 8.1*  --  7.7* 7.3* 7.5* 7.2* 7.2* 7.1*  MG 2.3  --  1.7 1.5*  --  1.5* 1.9 1.5*  PHOS 2.7 2.8 2.9  --   --   --   --   --    GFR: Estimated Creatinine Clearance: 86.1 mL/min (A) (by C-G formula based on SCr of  0.48 mg/dL (L)). Liver Function Tests: Recent Labs  Lab 02/28/22 1658  AST 14*  ALT <5  ALKPHOS 81  BILITOT 0.7  PROT 6.0*  ALBUMIN 2.3*   No results for input(s): "LIPASE", "AMYLASE" in the last 168 hours. No results for input(s): "AMMONIA" in the last 168 hours. Coagulation Profile: No results for input(s): "INR", "PROTIME" in the last 168 hours. Cardiac Enzymes: No results for input(s): "CKTOTAL", "CKMB", "CKMBINDEX", "TROPONINI" in the last 168 hours. BNP (last 3 results) No results for input(s): "PROBNP" in the last 8760 hours. HbA1C: No results for input(s): "HGBA1C" in the last 72 hours. CBG: Recent Labs  Lab 02/28/22 1616 02/28/22 1649  GLUCAP 82 92   Lipid Profile: No results for input(s): "CHOL", "HDL", "LDLCALC", "TRIG", "CHOLHDL", "LDLDIRECT" in the last 72 hours. Thyroid Function Tests: No results for input(s): "TSH", "T4TOTAL", "FREET4", "T3FREE", "THYROIDAB" in the last 72 hours. Anemia Panel: No results for input(s): "VITAMINB12", "FOLATE", "FERRITIN",  "TIBC", "IRON", "RETICCTPCT" in the last 72 hours. Sepsis Labs: Recent Labs  Lab 02/28/22 1658 03/01/22 0552  PROCALCITON 0.13 0.10  LATICACIDVEN 0.9  --     Recent Results (from the past 240 hour(s))  Blood culture (routine x 2)     Status: Abnormal   Collection Time: 02/20/22  3:47 PM   Specimen: BLOOD RIGHT FOREARM  Result Value Ref Range Status   Specimen Description   Final    BLOOD RIGHT FOREARM Performed at [facility], [place] [address], [place], [place] [address]    Special Requests   Final    BOTTLES DRAWN AEROBIC AND ANAEROBIC Blood Culture results may not be optimal due to an excessive volume of blood received in culture bottles Performed at [facility], [address] [place], [place] [address]    Culture  Setup Time   Final    GRAM POSITIVE COCCI IN CLUSTERS IN BOTH AEROBIC AND ANAEROBIC BOTTLES CRITICAL VALUE NOTED.  VALUE IS CONSISTENT WITH PREVIOUSLY REPORTED AND CALLED VALUE. Performed at [facility], [address] [place], [place] [address]    Culture (A)  Final    STAPHYLOCOCCUS AUREUS SUSCEPTIBILITIES PERFORMED ON PREVIOUS CULTURE WITHIN THE LAST 5 DAYS. Performed at [facility], [place] [address], [place], [place] [address]    Report Status 02/24/2022 FINAL  Final  Blood culture (routine x 2)     Status: Abnormal   Collection Time: 02/20/22  3:47 PM   Specimen: BLOOD LEFT FOREARM  Result Value Ref Range Status   Specimen Description   Final    BLOOD LEFT FOREARM Performed at [facility], [place] [address], [place], [place] [address]    Special Requests   Final    BOTTLES DRAWN AEROBIC AND ANAEROBIC Blood Culture results may not be optimal due to an excessive volume of blood received in culture bottles Performed at [facility], [address] [place], [place] [address]    Culture  Setup Time   Final    GRAM POSITIVE COCCI IN BOTH AEROBIC AND ANAEROBIC BOTTLES CRITICAL RESULT CALLED TO, READ BACK BY  AND VERIFIED WITH: Aubery Lapping PHARMD 1210 02/21/22 HNM Performed at [facility], [place] [address], [place], [place] [address]    Culture STAPHYLOCOCCUS AUREUS (A)  Final   Report Status 02/23/2022 FINAL  Final   Organism ID, Bacteria STAPHYLOCOCCUS AUREUS  Final      Susceptibility   Staphylococcus aureus - MIC*    CIPROFLOXACIN >=8 RESISTANT  Resistant     ERYTHROMYCIN RESISTANT Resistant     GENTAMICIN <=0.5 SENSITIVE Sensitive     OXACILLIN <=0.25 SENSITIVE Sensitive     TETRACYCLINE <=1 SENSITIVE Sensitive     VANCOMYCIN 1 SENSITIVE Sensitive     TRIMETH/SULFA <=10 SENSITIVE Sensitive     CLINDAMYCIN RESISTANT Resistant     RIFAMPIN <=0.5 SENSITIVE Sensitive     Inducible Clindamycin POSITIVE Resistant     * STAPHYLOCOCCUS AUREUS  Blood Culture ID Panel (Reflexed)     Status: Abnormal   Collection Time: 02/20/22  3:47 PM  Result Value Ref Range Status   Enterococcus faecalis NOT DETECTED NOT DETECTED Final   Enterococcus Faecium NOT DETECTED NOT DETECTED Final   Listeria monocytogenes NOT DETECTED NOT DETECTED Final   Staphylococcus species DETECTED (A) NOT DETECTED Final    Comment: CRITICAL RESULT CALLED TO, READ BACK BY AND VERIFIED WITH: JUSTIN MILLER PHARMD 1210 02/21/22 HNM    Staphylococcus aureus (BCID) DETECTED (A) NOT DETECTED Final    Comment: CRITICAL RESULT CALLED TO, READ BACK BY AND VERIFIED WITH: JUSTIN MILLER OTLXBW 6203 02/21/22 HNM    Staphylococcus epidermidis NOT DETECTED NOT DETECTED Final   Staphylococcus lugdunensis NOT DETECTED NOT DETECTED Final   Streptococcus species NOT DETECTED NOT DETECTED Final   Streptococcus agalactiae NOT DETECTED NOT DETECTED Final   Streptococcus pneumoniae NOT DETECTED NOT DETECTED Final   Streptococcus pyogenes NOT DETECTED NOT DETECTED Final   A.calcoaceticus-baumannii NOT DETECTED NOT DETECTED Final   Bacteroides fragilis NOT DETECTED NOT DETECTED Final   Enterobacterales NOT DETECTED NOT DETECTED Final    Enterobacter cloacae complex NOT DETECTED NOT DETECTED Final   Escherichia coli NOT DETECTED NOT DETECTED Final   Klebsiella aerogenes NOT DETECTED NOT DETECTED Final   Klebsiella oxytoca NOT DETECTED NOT DETECTED Final   Klebsiella pneumoniae NOT DETECTED NOT DETECTED Final   Proteus species NOT DETECTED NOT DETECTED Final   Salmonella species NOT DETECTED NOT DETECTED Final   Serratia marcescens NOT DETECTED NOT DETECTED Final   Haemophilus influenzae NOT DETECTED NOT DETECTED Final   Neisseria meningitidis NOT DETECTED NOT DETECTED Final   Pseudomonas aeruginosa NOT DETECTED NOT DETECTED Final   Stenotrophomonas maltophilia NOT DETECTED NOT DETECTED Final   Candida albicans NOT DETECTED NOT DETECTED Final   Candida auris NOT DETECTED NOT DETECTED Final   Candida glabrata NOT DETECTED NOT DETECTED Final   Candida krusei NOT DETECTED NOT DETECTED Final   Candida parapsilosis NOT DETECTED NOT DETECTED Final   Candida tropicalis NOT DETECTED NOT DETECTED Final   Cryptococcus neoformans/gattii NOT DETECTED NOT DETECTED Final   Meth resistant mecA/C and MREJ NOT DETECTED NOT DETECTED Final    Comment: Performed at [facility], [address] [place], [place] [address]  MRSA Next Gen by PCR, Nasal     Status: Abnormal   Collection Time: 02/20/22 10:34 PM   Specimen: Nasal Mucosa; Nasal Swab  Result Value Ref Range Status   MRSA by PCR Next Gen DETECTED (A) NOT DETECTED Final    Comment: RESULT CALLED TO, READ BACK BY AND VERIFIED WITH: GINA Loletta Specter 02/21/2022 AT 0014 SRR (NOTE) The GeneXpert MRSA Assay (FDA approved for NASAL specimens only), is one component of a comprehensive MRSA colonization surveillance program. It is not intended to diagnose MRSA infection nor to guide or monitor treatment for MRSA infections. Test performance is not FDA approved in patients less than 66 years old. Performed at [facility], [address] [place], [place] [address]  Culture, blood (Routine X 2) w Reflex to ID Panel     Status: None   Collection Time: 02/22/22  5:23 AM   Specimen: BLOOD  Result Value Ref Range Status   Specimen Description BLOOD RIGHT [facility]  Final   Special Requests   Final    BOTTLES DRAWN AEROBIC AND ANAEROBIC Blood Culture adequate volume   Culture   Final    NO GROWTH 5 DAYS Performed at [facility], [address] [place], [place] [address]    Report Status 02/27/2022 FINAL  Final  Culture, blood (Routine X 2) w Reflex to ID Panel     Status: None   Collection Time: 02/22/22  5:23 AM   Specimen: BLOOD LEFT HAND  Result Value Ref Range Status   Specimen Description BLOOD LEFT HAND  Final   Special Requests   Final    BOTTLES DRAWN AEROBIC AND ANAEROBIC Blood Culture results may not be optimal due to an inadequate volume of blood received in culture bottles   Culture   Final    NO GROWTH 5 DAYS Performed at [facility], [address] [place], [place] [address]    Report Status 02/27/2022 FINAL  Final  Aerobic Culture w Gram Stain (superficial specimen)     Status: None   Collection Time: 02/23/22  4:52 PM   Specimen: Wound  Result Value Ref Range Status   Specimen Description   Final    WOUND Performed at [facility], [address], [place], [place] [address]    Special Requests ILIAC RIGHT  Final   Gram Stain   Final    NO WBC SEEN NO ORGANISMS SEEN Performed at [facility], [place] [address] [place],  [address]    Culture   Final    RARE STAPHYLOCOCCUS AUREUS RARE CANDIDA ALBICANS    Report Status 02/26/2022 FINAL  Final   Organism ID, Bacteria STAPHYLOCOCCUS AUREUS  Final      Susceptibility   Staphylococcus aureus - MIC*    CIPROFLOXACIN >=8 RESISTANT Resistant     ERYTHROMYCIN RESISTANT Resistant     GENTAMICIN <=0.5 SENSITIVE Sensitive     OXACILLIN 0.5 SENSITIVE Sensitive     TETRACYCLINE <=1 SENSITIVE Sensitive     VANCOMYCIN <=0.5 SENSITIVE Sensitive      TRIMETH/SULFA <=10 SENSITIVE Sensitive     CLINDAMYCIN RESISTANT Resistant     RIFAMPIN <=0.5 SENSITIVE Sensitive     Inducible Clindamycin POSITIVE Resistant     * RARE STAPHYLOCOCCUS AUREUS         Radiology Studies: CT HEAD CODE STROKE WO CONTRAST`  Result Date: 02/28/2022 CLINICAL DATA:  Code stroke.  Acute neuro deficit.  Suspect stroke EXAM: CT HEAD WITHOUT CONTRAST TECHNIQUE: Contiguous axial images were obtained from the base of the skull through the vertex without intravenous contrast. RADIATION DOSE REDUCTION: This exam was performed according to the departmental dose-optimization program which includes automated exposure control, adjustment of the mA and/or kV according to patient size and/or use of iterative reconstruction technique. COMPARISON:  None Available. FINDINGS: Brain: Generalized atrophy. Mild patchy white matter hypodensity bilaterally Negative for acute infarct, hemorrhage, mass Vascular: Negative for hyperdense vessel Skull: Negative Sinuses/Orbits: Paranasal sinuses clear.  Negative orbit Other: None ASPECTS ([place] Stroke Program Early CT Score) - Ganglionic level infarction (caudate, lentiform nuclei, internal capsule, insula, M1-M3 cortex): 7 - Supraganglionic infarction (M4-M6 cortex): 3 Total score (0-10 with 10 being normal): 10 IMPRESSION: 1. No acute abnormality. Atrophy and  mild chronic microvascular ischemia. 2. Aspects is 10. 3. Code stroke imaging results were communicated on 02/28/2022 at 4:39 pm to provider Erlinda Hong via [facility] text page Electronically Signed   By: Franchot Gallo M.D.   On: 02/28/2022 16:40   DG Chest Port 1 View  Result Date: 02/28/2022 CLINICAL DATA:  Shortness of breath. EXAM: PORTABLE CHEST 1 VIEW COMPARISON:  02/27/2022 FINDINGS: Right-sided PICC line is in good position, unchanged. Persistent bilateral pulmonary infiltrates and left pleural effusion. IMPRESSION: Persistent bilateral pulmonary infiltrates and left pleural effusion.  Electronically Signed   By: Marijo Sanes M.D.   On: 02/28/2022 15:52        Scheduled Meds:  allopurinol  100 mg Oral Daily   amiodarone  200 mg Oral Daily   apixaban  5 mg Oral BID   vitamin C  500 mg Oral BID   Chlorhexidine Gluconate Cloth  6 each Topical Daily   diazepam  5 mg Oral Q6H   diltiazem  90 mg Oral Q12H   feeding supplement  237 mL Oral TID BM   furosemide  40 mg Intravenous BID   guaiFENesin  600 mg Oral BID   leptospermum manuka honey  1 Application Topical Daily   metoprolol tartrate  25 mg Oral BID   midodrine  10 mg Oral TID WC   morphine  15 mg Oral Q12H   multivitamin with minerals  1 tablet Oral Daily   mupirocin ointment   Nasal BID   pantoprazole  40 mg Oral Daily   potassium chloride  40 mEq Oral BID   sodium chloride flush  10-40 mL Intracatheter Q12H   vitamin A  10,000 Units Oral Daily   zinc sulfate  220 mg Oral Daily   Continuous Infusions:  sodium chloride 10 mL/hr at 03/02/22 0300   ampicillin-sulbactam (UNASYN) IV     magnesium sulfate bolus IVPB       LOS: 10 days    Sidney Ace, MD [facility]   If 7PM-7AM, please contact night-coverage  03/02/2022, 12:43 PM
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
## 2022-03-02 NOTE — Progress Notes (Signed)
Occupational Therapy Treatment Patient Details Name: Jesse Whitney MRN: [id number] DOB: April 18, 1955 Today's Date: 03/02/2022   History of present illness 66yo M w/ h/o functional quadriplegia, h/o DVT, syringomyelia, Aflutter, peripheral neuropathy, BPH, DJD of shoulder, & neurogenic bladder presenting for evaluation of   Leukocytosis from PCP w/u for RLE c/f cellulitis.  Hospital stay complicated by episode of respiratory distress requiring transfer to CCU (02/28/22); attributed to metabolic encephalopathy and mucous plug.   OT comments  Upon entering the room, pt supine in bed with wife present in room. OT did discuss in detail and answer some questions in regard to recommendation for inpatient rehab. Pt reports not feeling well with increased confusion today and not sleeping well last night. He is agreeable to OT intervention with encouragement. Pt wiping face with R UE and set up A. Pt rolls to the R with max A and hooking R UE onto bed rail. Pt noted to have BM and needing total A for hygiene and chux pad change. Pt tolerates bed mobility and multiple rolls for hygiene and linen change. OT also noting pt has rash on upper to low back. RN notified. Pt repositioned for comfort. All needs within reach. Pt continues to benefit from OT intervention.    Recommendations for follow up therapy are one component of a multi-disciplinary discharge planning process, led by the attending physician.  Recommendations may be updated based on patient status, additional functional criteria and insurance authorization.    Follow Up Recommendations  Acute inpatient rehab (3hours/day)     Assistance Recommended at Discharge Frequent or constant Supervision/Assistance  Patient can return home with the following  Two people to help with walking and/or transfers;Assist for transportation;Help with stairs or ramp for entrance;Two people to help with bathing/dressing/bathroom   Equipment Recommendations  Other  (comment) (defer to next venue of care)       Precautions / Restrictions Precautions Precautions: Fall Precaution Comments: hx of SCI Restrictions Weight Bearing Restrictions: No       Mobility Bed Mobility Overal bed mobility: Needs Assistance Bed Mobility: Rolling Rolling: Max assist              Transfers                   General transfer comment: not attempted         ADL either performed or assessed with clinical judgement   ADL Overall ADL's : Needs assistance/impaired     Grooming: Wash/dry hands;Wash/dry face;Bed level;Set up                                 General ADL Comments: Pt with BM and needing total A for hygiene with max A to roll L <> R    Extremity/Trunk Assessment Upper Extremity Assessment Upper Extremity Assessment: Generalized weakness            Vision Patient Visual Report: No change from baseline            Cognition Arousal/Alertness: Awake/alert Behavior During Therapy: Flat affect Overall Cognitive Status: Impaired/Different from baseline Area of Impairment: Safety/judgement, Awareness                   Current Attention Level: Selective Memory: Decreased short-term memory Following Commands: Follows one step commands consistently Safety/Judgement: Decreased awareness of deficits, Decreased awareness of safety Awareness: Emergent, Intellectual Problem Solving: Slow processing, Requires verbal cues, Requires tactile cues General  Comments: pt needing increased time to process during this session and more confused                   Pertinent Vitals/ Pain       Pain Assessment Pain Assessment: No/denies pain         Frequency  Min 4X/week        Progress Toward Goals  OT Goals(current goals can now be found in the care plan section)  Progress towards OT goals: Progressing toward goals  Acute Rehab OT Goals Patient Stated Goal: to go to rehab OT Goal Formulation: With  patient/family Time For Goal Achievement: Mar 12, 2022 Potential to Achieve Goals: Fair  Plan Discharge plan remains appropriate;Frequency needs to be updated       AM-PAC OT "6 Clicks" Daily Activity     Outcome Measure   Help from another person eating meals?: A Lot Help from another person taking care of personal grooming?: Total Help from another person toileting, which includes using toliet, bedpan, or urinal?: Total Help from another person bathing (including washing, rinsing, drying)?: Total Help from another person to put on and taking off regular upper body clothing?: Total Help from another person to put on and taking off regular lower body clothing?: Total 6 Click Score: 7    End of Session    OT Visit Diagnosis: Unsteadiness on feet (R26.81);Repeated falls (R29.6);Muscle weakness (generalized) (M62.81);History of falling (Z91.81)   Activity Tolerance Patient limited by fatigue   Patient Left in bed;with call bell/phone within reach;with bed alarm set   Nurse Communication Mobility status        Time: 4268-3419 OT Time Calculation (min): 35 min  Charges: OT General Charges $OT Visit: 1 Visit OT Treatments $Self Care/Home Management : 23-37 mins  Jackquline Denmark, MS, OTR/L , CBIS ascom [phone]  03/02/22, 2:19 PM

## 2022-03-02 NOTE — Progress Notes (Cosign Needed Addendum)
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   Osteomyelitis (HCC)   Permanent atrial fibrillation (HCC)   MSSA (methicillin susceptible  Staphylococcus aureus)   Bacteremia due to methicillin susceptible Staphylococcus aureus (MSSA)    ASSESSMENT AND PLAN:  Jesse Whitney is a 66yo paraplegic male with a PMH of SCI (C5-C7), autonomic dysreflexia, neurogenic bladder (self catheterizes 4x daily), 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  He is being treated for septic shock 2/2 right ischial decubitus ulcer. Blood cultures positive for MSSA.  Hospital course has been complicated by A-fib with RVR and volume overload. TEE performed 11/27 was negative for vegetations, ruling out endocarditis.  # sepsis 2/2 MSSA bacteremia, right ischial decubitus ulcer  Infectious disease following, currently on cefazolin and Flagyl.  TEE negative for vegetations.  # paroxysmal AF RVR In the setting of sepsis and bacteremia. Suspect that as volume status improves and infection  clears that rate will be better controlled.  -monitor and replete electrolytes for K>4 and Mg>2 -s/p IV amio load and gtt, PO amiodarone 200 twice a day for 4 days. -Continue amio PO  once daily.  -continue midodrine  TID, wean as tolerated -increase diltiazem to  SR BID  -change metoprolol from 12.5mg  three times daily to 25 BID -s/p heparin gtt. continue eliquis  BID. CHADS2-VASC 2 (hx DVT)   # acute on chronic HFpEF  S/p rapid response the afternoon of 11/29 where patient's breathing was labored and was hypoxic on room air to 80s. Improved following lasix administration and was transferred to stepdown. CT head unremarkable, patient and wife refused MRI.  - agree with continuing lasix  IV BID, diuresing well. Now net negative   - discussed with Dr. Rito Ehrlich after reviewing TTE and TEE reports noting EF discrepancies (35-40% on TTE 11/21 & 60-65% on TEE 11/27) with Dr. Gwen Pounds (interpreting physician of both of these studies) who suspects that with treatment of sepsis & bacteremia and rate control of his AF reflect the improvement in his EF appropriately.   Will arrange for follow up with Dr. Darrold Junker, 1-2 weeks after discharge. Prefers PM appointments.   This patient's plan of care was discussed and created with Dr. Juliann Pares and he is in agreement.  Signed: Rebeca Allegra , PA-C 03/02/2022, 10:16 AM [facility]

## 2022-03-02 NOTE — Progress Notes (Signed)
   Inpatient Rehab Admissions Coordinator :  Per therapy recommendations patient was screened for CIR candidacy by Nihal Doan RN MSN. Patient is not yet at a level to tolerate the intensity required to pursue a CIR admit . Patient may have the potential to progress to become a candidate. The CIR admissions team will follow and monitor for progress and place a Rehab Consult order if felt to be appropriate. Please contact me with any questions.  Luisalberto Beegle RN MSN Admissions Coordinator [phone]  
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
## 2022-03-03 DIAGNOSIS — R6521 Severe sepsis with septic shock: Secondary | ICD-10-CM | POA: Diagnosis not present

## 2022-03-03 DIAGNOSIS — A419 Sepsis, unspecified organism: Secondary | ICD-10-CM | POA: Diagnosis not present

## 2022-03-03 LAB — MAGNESIUM: Magnesium: 1.9 mg/dL (ref 1.7–2.4)

## 2022-03-03 LAB — CBC
HCT: 41.4 % (ref 39.0–52.0)
Hemoglobin: 13 g/dL (ref 13.0–17.0)
MCH: 31.6 pg (ref 26.0–34.0)
MCHC: 31.4 g/dL (ref 30.0–36.0)
MCV: 100.5 fL — ABNORMAL HIGH (ref 80.0–100.0)
Platelets: 455 10*3/uL — ABNORMAL HIGH (ref 150–400)
RBC: 4.12 MIL/uL — ABNORMAL LOW (ref 4.22–5.81)
RDW: 15.5 % (ref 11.5–15.5)
WBC: 18.4 10*3/uL — ABNORMAL HIGH (ref 4.0–10.5)
nRBC: 0 % (ref 0.0–0.2)

## 2022-03-03 LAB — ZINC: Zinc: 46 ug/dL (ref 44–115)

## 2022-03-03 MED ORDER — ORAL CARE MOUTH RINSE: 15.0000 mL | OROMUCOSAL | Status: DC | PRN

## 2022-03-03 MED ORDER — MORPHINE SULFATE (PF) 2 MG/ML IV SOLN: 2.0000 mg | Freq: Four times a day (QID) | INTRAVENOUS | Status: DC | PRN

## 2022-03-03 MED ORDER — ORAL CARE MOUTH RINSE
15.0000 mL | OROMUCOSAL | Status: DC
  Administered 2022-03-03 – 2022-03-07 (×10): 15 mL via OROMUCOSAL

## 2022-03-03 MED ORDER — FUROSEMIDE 10 MG/ML IJ SOLN
40.0000 mg | Freq: Every day | INTRAMUSCULAR | Status: DC
  Administered 2022-03-04: 40 mg via INTRAVENOUS
  Filled 2022-03-03: qty 4
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
## 2022-03-03 NOTE — Progress Notes (Signed)
[facility]    SUBJECTIVE: Patient states.  We will with functional quadriplegia presents emergency room initially after being sent by his primary physician was found to have septic shock with right ischial tuberosity pressure injury osteomyelitis and sepsis now on broad-spectrum antibiotic therapy for MRSA seems to be doing reasonably better   Vitals:   03/02/22 2349 03/03/22 0410 03/03/22 0744 03/03/22 1126  BP: 133/83 (!) 124/98 102/70 103/81  Pulse: (!) 104 (!) 111 99 (!) 109  Resp: 16 18 18 20   Temp: 99.2 F (37.3 C) 99.3 F (37.4 C) 98.4 F (36.9 C) 97.8 F (36.6 C)  TempSrc:      SpO2: 93% 96% 100% 98%  Weight:      Height:         Intake/Output Summary (Last 24 hours) at 03/03/2022 1207 Last data filed at 03/03/2022 0700 Gross per 24 hour  Intake 350.07 ml  Output 1800 ml  Net -1449.93 ml      PHYSICAL EXAM  General: Well developed, well nourished, in no acute distress HEENT:  Normocephalic and atramatic Neck:  No JVD.  Lungs: Clear bilaterally to auscultation and percussion. Heart: HRRR . Normal S1 and S2 without gallops or murmurs.  Abdomen: Bowel sounds are positive, abdomen soft and non-tender  Msk:  Back normal, normal gait. Normal strength and tone for age. Extremities: No clubbing, cyanosis or edema.  Decubitus I right buttock  Neuro: Alert and oriented X 3.  Quadriplegia Psych:  Good affect, responds appropriately   LABS: Basic Metabolic Panel: Recent Labs    03/01/22 0552 03/02/22 0921 03/03/22 0337  NA 144 142  --   K 3.2* 3.0*  --   CL 107 103  --   CO2 32 32  --   GLUCOSE 104* 98  --   BUN 12 9  --   CREATININE 0.45* 0.48*  --   CALCIUM 7.2* 7.1*  --   MG 1.9 1.5* 1.9   Liver Function Tests: Recent Labs    02/28/22 1658  AST 14*  ALT <5  ALKPHOS 81  BILITOT 0.7  PROT 6.0*  ALBUMIN 2.3*   No results for input(s): "LIPASE", "AMYLASE" in the last 72 hours. CBC: Recent Labs    02/28/22 1658 03/01/22 0552 03/02/22 0921  03/03/22 0337  WBC 16.9*   < > 16.1* 18.4*  NEUTROABS 14.9*  --   --   --   HGB 12.2*   < > 11.6* 13.0  HCT 38.9*   < > 36.6* 41.4  MCV 102.4*   < > 100.8* 100.5*  PLT 363   < > 359 455*   < > = values in this interval not displayed.   Cardiac Enzymes: No results for input(s): "CKTOTAL", "CKMB", "CKMBINDEX", "TROPONINI" in the last 72 hours. BNP: Invalid input(s): "POCBNP" D-Dimer: No results for input(s): "DDIMER" in the last 72 hours. Hemoglobin A1C: No results for input(s): "HGBA1C" in the last 72 hours. Fasting Lipid Panel: No results for input(s): "CHOL", "HDL", "LDLCALC", "TRIG", "CHOLHDL", "LDLDIRECT" in the last 72 hours. Thyroid Function Tests: No results for input(s): "TSH", "T4TOTAL", "T3FREE", "THYROIDAB" in the last 72 hours.  Invalid input(s): "FREET3" Anemia Panel: No results for input(s): "VITAMINB12", "FOLATE", "FERRITIN", "TIBC", "IRON", "RETICCTPCT" in the last 72 hours.  No results found.   Echo mild to moderate reduced left ventricular function EF around 35 to 40%  TELEMETRY: Atrial fibrillation rate of 110 nonspecific ST-T wave changes:  ASSESSMENT AND PLAN:  Active Problems:  Septic shock (HCC)   Decubitus ulcer of ischial area, right, stage II (HCC)   Osteomyelitis (HCC)   Permanent atrial fibrillation (HCC)   MSSA (methicillin susceptible Staphylococcus aureus)   Bacteremia due to methicillin susceptible Staphylococcus aureus (MSSA)    Plan Continue antibiotic therapy for MRSA for chronic decubitus right buttock ulcer surgical debridement necessary Septic shock continue broad-spectrum antibiotic therapy follow-up white count and laboratories Osteomyelitis continue current therapy consider debridement if necessary Acute on chronic respiratory failure hypoxemia improved and resolved continue to follow and monitor History of acute metabolic encephalopathy resolved because of hypoxemia mucous plug Functional quadriplegia stable continue  physical therapy Right tibial plateau fracture follow-up with orthopedics Acute on chronic atrial fibrillation RVR related to sepsis currently on amiodarone Cardizem Eliquis metoprolol therapy    Consider ID input   Alwyn Pea, MD 03/03/2022 12:07 PM

## 2022-03-04 DIAGNOSIS — A419 Sepsis, unspecified organism: Secondary | ICD-10-CM | POA: Diagnosis not present

## 2022-03-04 DIAGNOSIS — R6521 Severe sepsis with septic shock: Secondary | ICD-10-CM | POA: Diagnosis not present

## 2022-03-04 LAB — BASIC METABOLIC PANEL
Anion gap: 7 (ref 5–15)
BUN: 11 mg/dL (ref 8–23)
CO2: 33 mmol/L — ABNORMAL HIGH (ref 22–32)
Calcium: 7.9 mg/dL — ABNORMAL LOW (ref 8.9–10.3)
Chloride: 100 mmol/L (ref 98–111)
Creatinine, Ser: 0.49 mg/dL — ABNORMAL LOW (ref 0.61–1.24)
GFR, Estimated: >60 mL/min (ref >60)
Glucose, Bld: 104 mg/dL — ABNORMAL HIGH (ref 70–99)
Potassium: 4 mmol/L (ref 3.5–5.1)
Sodium: 140 mmol/L (ref 135–145)

## 2022-03-04 LAB — CBC
HCT: 42.9 % (ref 39.0–52.0)
Hemoglobin: 13.7 g/dL (ref 13.0–17.0)
MCH: 32.1 pg (ref 26.0–34.0)
MCHC: 31.9 g/dL (ref 30.0–36.0)
MCV: 100.5 fL — ABNORMAL HIGH (ref 80.0–100.0)
Platelets: 512 10*3/uL — ABNORMAL HIGH (ref 150–400)
RBC: 4.27 MIL/uL (ref 4.22–5.81)
RDW: 15.9 % — ABNORMAL HIGH (ref 11.5–15.5)
WBC: 19.5 10*3/uL — ABNORMAL HIGH (ref 4.0–10.5)
nRBC: 0 % (ref 0.0–0.2)

## 2022-03-04 LAB — MAGNESIUM: Magnesium: 1.7 mg/dL (ref 1.7–2.4)

## 2022-03-04 MED ORDER — POTASSIUM CHLORIDE CRYS ER 20 MEQ PO TBCR
40.0000 meq | EXTENDED_RELEASE_TABLET | Freq: Once | ORAL | Status: AC
  Administered 2022-03-04: 40 meq via ORAL
  Filled 2022-03-04: qty 2

## 2022-03-04 MED ORDER — MAGNESIUM SULFATE 2 GM/50ML IV SOLN
2.0000 g | Freq: Once | INTRAVENOUS | Status: AC
  Administered 2022-03-04: 2 g via INTRAVENOUS
  Filled 2022-03-04: qty 50

## 2022-03-04 MED ORDER — DIAZEPAM 5 MG PO TABS
5.0000 mg | ORAL_TABLET | Freq: Four times a day (QID) | ORAL | Status: DC
  Administered 2022-03-04 – 2022-03-07 (×10): 5 mg via ORAL
  Filled 2022-03-04 (×12): qty 1

## 2022-03-04 MED ORDER — MIDODRINE HCL 5 MG PO TABS
5.0000 mg | ORAL_TABLET | Freq: Three times a day (TID) | ORAL | Status: DC
  Administered 2022-03-04 (×2): 5 mg via ORAL
  Filled 2022-03-04 (×2): qty 1

## 2022-03-04 NOTE — Progress Notes (Signed)
[facility] Card[facility]    SUBJECTIVE: Patient states he is generally to be well has had physical therapy to help with standing and a few steps denies any significant pain   Vitals:   03/04/22 0404 03/04/22 0500 03/04/22 0802 03/04/22 1112  BP: (!) 160/101  (!) 154/99 117/85  Pulse: (!) 125  (!) 139 84  Resp: (!) 30  17 17   Temp: 98.4 F (36.9 C)  98.1 F (36.7 C) 97.9 F (36.6 C)  TempSrc: Oral  Oral Oral  SpO2: 95%  96% 96%  Weight:  72.5 kg    Height:         Intake/Output Summary (Last 24 hours) at 03/04/2022 1139 Last data filed at 03/04/2022 1112 Gross per 24 hour  Intake 96.78 ml  Output 2450 ml  Net -2353.22 ml      PHYSICAL EXAM  General: Well developed, well nourished, in no acute distress HEENT:  Normocephalic and atramatic Neck:  No JVD.  Lungs: Clear bilaterally to auscultation and percussion. Heart: HRRR . Normal S1 and S2 without gallops or murmurs.  Abdomen: Bowel sounds are positive, abdomen soft and non-tender  Msk:  Back normal, normal gait. Normal strength and tone for age. Extremities: No clubbing, cyanosis or edema.   Neuro: Generalized weakness functional quadriplegia Psych:  Good affect, responds appropriately   LABS: Basic Metabolic Panel: Recent Labs    03/02/22 0921 03/03/22 0337 03/04/22 0810  NA 142  --  140  K 3.0*  --  4.0  CL 103  --  100  CO2 32  --  33*  GLUCOSE 98  --  104*  BUN 9  --  11  CREATININE 0.48*  --  0.49*  CALCIUM 7.1*  --  7.9*  MG 1.5* 1.9 1.7   Liver Function Tests: No results for input(s): "AST", "ALT", "ALKPHOS", "BILITOT", "PROT", "ALBUMIN" in the last 72 hours. No results for input(s): "LIPASE", "AMYLASE" in the last 72 hours. CBC: Recent Labs    03/03/22 0337 03/04/22 0810  WBC 18.4* 19.5*  HGB 13.0 13.7  HCT 41.4 42.9  MCV 100.5* 100.5*  PLT 455* 512*   Cardiac Enzymes: No results for input(s): "CKTOTAL", "CKMB", "CKMBINDEX", "TROPONINI" in the last 72 hours. BNP: Invalid input(s):  "POCBNP" D-Dimer: No results for input(s): "DDIMER" in the last 72 hours. Hemoglobin A1C: No results for input(s): "HGBA1C" in the last 72 hours. Fasting Lipid Panel: No results for input(s): "CHOL", "HDL", "LDLCALC", "TRIG", "CHOLHDL", "LDLDIRECT" in the last 72 hours. Thyroid Function Tests: No results for input(s): "TSH", "T4TOTAL", "T3FREE", "THYROIDAB" in the last 72 hours.  Invalid input(s): "FREET3" Anemia Panel: No results for input(s): "VITAMINB12", "FOLATE", "FERRITIN", "TIBC", "IRON", "RETICCTPCT" in the last 72 hours.  No results found.   Echo mild to moderate reduced left ventricular function globally EF 35 to 40%  TELEMETRY: Tachycardia rate of around 100 sinus:  ASSESSMENT AND PLAN:  Principal Problem:   Septic shock (HCC) Active Problems:   Decubitus ulcer of ischial area, right, stage II (HCC)   Osteomyelitis (HCC)   Permanent atrial fibrillation (HCC)   MSSA (methicillin susceptible Staphylococcus aureus)   Bacteremia due to methicillin susceptible Staphylococcus aureus (MSSA)    Plan Continue broad-spectrum antibiotic therapy For sepsis and infection Decubitus ulcer and ischial tuberosity area continue debridement as necessary by surgery Permanent atrial fibrillation continue anticoagulation therapy MSSA continue antibiotic therapy Functional quadriplegia continue physical therapy and supportive management Continue conservative cardiac management and care   14/03/23, MD 03/04/2022  11:39 AM

## 2022-03-04 NOTE — Progress Notes (Signed)
PROGRESS NOTE    Jesse Whitney  JTT:[id number] DOB: 09-01-1955 DOA: 02/20/2022 PCP: [name], PA-C    Brief Narrative:  66 year old male with functional paraplegia presented to the emergency room on 11/21 after being sent over by his PCP for evaluation of high white blood cell count with extended workup revealing septic shock secondary to osteomyelitis of right ischial tuberosity from pressure injury.  Blood cultures positive for MSSA.  Infectious disease following.  Patient underwent TEE on 11/27 which was unremarkable.  PICC line placed 11/28.  Patient noted to have significant scrotal edema and BNP checked found to be elevated and patient started on IV Lasix, with significant response, diuresing almost 5 L and is more than -2 L deficient.   On late afternoon of 11/29, patient went into significant respiratory distress requiring strong supplemental oxygen and having mental status change.  Stroke ruled out by CT.  Patient transferred to ICU.  Chest x-ray not showing significant increase in fluid.  That night, with respiratory therapy help, patient coughed up large volume mucous plug and after that, breathing significantly improved and by morning of 11/30, able to be weaned off of oxygen altogether.  Mentation normalized.  12/1: Patient remains in rapid atrial fibrillation.  Volume status improved.  Of note patient was upset this morning.  Per patient and wife he was left overnight without a bear hugger.  Unclear true etiology of events.  Patient's mental status has been somewhat waxing and waning.  12/2: Patient transferred to medical floor.  Appears much more stable this morning.  Mental status intact.  12/3: Patient's family members were concerned about agitation and delirium yesterday.  Felt to be attributed to Valium.  Valium stopped.  White blood cell count still going up on 12/3.  Heart rate has been difficult to control.   Assessment & Plan:   Principal Problem:   Septic  shock ([place]) Active Problems:   Decubitus ulcer of ischial area, right, stage II (HCC)   Osteomyelitis (HCC)   Permanent atrial fibrillation (HCC)   MSSA (methicillin susceptible Staphylococcus aureus)   Bacteremia due to methicillin susceptible Staphylococcus aureus (MSSA)  Methicillin-susceptible Staph aureus septic shock secondary to right decubitus ulcer right buttock.  Septic shock resolved. Weaned off of vasopressor.  ID following.  Following TEE, will determine length of time for IV Ancef.  Patient met criteria for septic shock on admission given tachycardia and tachypnea plus persistent hypotension requiring pressor support.   Weaned off pressors and transferred to general medical floor on 12/2 White count continues to be elevated and uptrending Plan: Continue Ancef  Added Flagyl on 12/1 Added fluconazole 12/1 Daily labs Monitor vitals and fever curve Wean midodrine, 5 3 times daily today with plans to discontinue on 12/4   Acute on chronic respiratory failure with hypoxia-resolved Felt to be secondary to mucous plug.  Continue to monitor.  Patient remains on room air  Acute metabolic encephalopathy Secondary to hypoxia brought on by mucous plug.  Somewhat agitated at times.  Possible hospital-acquired delirium.  Seems improved after transfer out of ICU.  Also contribution from p.o. Valium.  This has been stopped on 12/2.   Right tibial plateau fracture status post recent fall -- patient was seen by Dr. Mack Guise orthopedic who recommends no surgical intervention. 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 Dr. Mack Guise recommends follow-up in the office in 1 to 2 weeks to see if patient would be a candidate for telescoping knee brace for immobilization.  As  needed pain medication.   Overweight Meets criteria with BMI greater than 25   Acute on chronic a fib with RVR in the setting of sepsis Rate control has been difficult to achieve.  Cardizem,  metoprolol, amiodarone, Eliquis.  Telemetry monitoring.   Spinal cord injury with functional quadriplegia and neurogenic bladder and autonomic dysreflexia -- symptomatic care   Acute on chronic systolic heart failure: Echocardiogram in September of this year noted preserved ejection fraction and grade 2 diastolic dysfunction and echocardiogram done 11/23 noted ejection fraction of 35 to 93% with no diastolic dysfunction noted.  TEE notes preserved ejection fraction.  Cardiology with treatment of sepsis and rate controlled atrial fibrillation, this accounts for differences.    Started on IV Lasix plus albumin after noted to have intermittent episodes of borderline hypoxia BNP of 500.  He continues to respond well, diuresing almost 10 L of fluid.   Net - 5.5 L as of 12/ 3 Plan: Discontinue diuretic   Hypomagnesemia/hypokalemia  Monitor and replace as necessary.   DVT prophylaxis: Eliquis Code Status: DNR Family Communication: Spouse at bedside 12/1, niece at bedside 12/2 Disposition Plan: Status is: Inpatient Remains inpatient appropriate because: Resolving septic shock.  Remains on IV antibiotics.   Level of care: Progressive  Consultants:  ID Neurology  Procedures:  TEE  Antimicrobials: Ancef Flagyl Diflucan   Subjective: Seen and examined.  Appears more depressed this morning.  Objective: Vitals:   03/03/22 2356 03/04/22 0404 03/04/22 0500 03/04/22 0802  BP: 133/89 (!) 160/101  (!) 154/99  Pulse: (!) 118 (!) 125  (!) 139  Resp: 20 (!) 30  17  Temp: 99.1 F (37.3 C) 98.4 F (36.9 C)  98.1 F (36.7 C)  TempSrc: Oral Oral  Oral  SpO2: 98% 95%  96%  Weight:   72.5 kg   Height:        Intake/Output Summary (Last 24 hours) at 03/04/2022 1049 Last data filed at 03/04/2022 0802 Gross per 24 hour  Intake 96.78 ml  Output 2450 ml  Net -2353.22 ml   Filed Weights   03/01/22 0600 03/02/22 0200 03/04/22 0500  Weight: 79.4 kg 77.4 kg 72.5 kg     Examination:  General exam: Flattened affect.  Depressed Respiratory system: Lungs clear.  Normal work of breathing.  Room air Cardiovascular system: S1-S2, tachycardic, irregular rhythm, no murmurs Gastrointestinal system: Soft, NT/ND, normal bowel sounds. Central nervous system: Spastic paraplegia Extremities: Symmetric 5 x 5 power. Skin: Stage IV sacral decubitus ulcer Psychiatry: Judgement and insight appear impaired. Mood & affect depressed.     Data Reviewed: I have personally reviewed following labs and imaging studies  CBC: Recent Labs  Lab 02/28/22 1658 03/01/22 0552 03/02/22 0921 03/03/22 0337 03/04/22 0810  WBC 16.9* 15.0* 16.1* 18.4* 19.5*  NEUTROABS 14.9*  --   --   --   --   HGB 12.2* 11.3* 11.6* 13.0 13.7  HCT 38.9* 34.2* 36.6* 41.4 42.9  MCV 102.4* 100.3* 100.8* 100.5* 100.5*  PLT 363 311 359 455* 716*   Basic Metabolic Panel: Recent Labs  Lab 02/26/22 0431 02/28/22 0815 02/28/22 1658 02/28/22 2100 03/01/22 0552 03/02/22 0921 03/03/22 0337 03/04/22 0810  NA 144   < > 143 145 144 142  --  140  K 3.7   < > 3.6 3.6 3.2* 3.0*  --  4.0  CL 109   < > 106 109 107 103  --  100  CO2 27   < > 32 31 32 32  --  33*  GLUCOSE 92   < > 107* 103* 104* 98  --  104*  BUN 24*   < > _0 --  11  CREATININE 0.80   < > 0.67 0.64 0.45* 0.48*  --  0.49*  CALCIUM 7.7*   < > 7.5* 7.2* 7.2* 7.1*  --  7.9*  MG 1.7   < >  --  1.5* 1.9 1.5* 1.9 1.7  PHOS 2.9  --   --   --   --   --   --   --    < > = values in this interval not displayed.   GFR: Estimated Creatinine Clearance: 84.9 mL/min (A) (by C-G formula based on SCr of 0.49 mg/dL (L)). Liver Function Tests: Recent Labs  Lab 02/28/22 1658  AST 14*  ALT <5  ALKPHOS 81  BILITOT 0.7  PROT 6.0*  ALBUMIN 2.3*   No results for input(s): "LIPASE", "AMYLASE" in the last 168 hours. No results for input(s): "AMMONIA" in the last 168 hours. Coagulation Profile: No results for input(s): "INR", "PROTIME" in  the last 168 hours. Cardiac Enzymes: No results for input(s): "CKTOTAL", "CKMB", "CKMBINDEX", "TROPONINI" in the last 168 hours. BNP (last 3 results) No results for input(s): "PROBNP" in the last 8760 hours. HbA1C: No results for input(s): "HGBA1C" in the last 72 hours. CBG: Recent Labs  Lab 02/28/22 1616 02/28/22 1649  GLUCAP 82 92   Lipid Profile: No results for input(s): "CHOL", "HDL", "LDLCALC", "TRIG", "CHOLHDL", "LDLDIRECT" in the last 72 hours. Thyroid Function Tests: No results for input(s): "TSH", "T4TOTAL", "FREET4", "T3FREE", "THYROIDAB" in the last 72 hours. Anemia Panel: No results for input(s): "VITAMINB12", "FOLATE", "FERRITIN", "TIBC", "IRON", "RETICCTPCT" in the last 72 hours. Sepsis Labs: Recent Labs  Lab 02/28/22 1658 03/01/22 0552  PROCALCITON 0.13 0.10  LATICACIDVEN 0.9  --     Recent Results (from the past 240 hour(s))  Aerobic Culture w Gram Stain (superficial specimen)     Status: None   Collection Time: 02/23/22  4:52 PM   Specimen: Wound  Result Value Ref Range Status   Specimen Description   Final    WOUND Performed at [facility], [address] [place], [place] [address]    Special Requests ILIAC RIGHT  Final   Gram Stain   Final    NO WBC SEEN NO ORGANISMS SEEN Performed at [facility], [place] [address], [place], [place] [address]    Culture   Final    RARE STAPHYLOCOCCUS AUREUS RARE CANDIDA ALBICANS    Report Status 02/26/2022 FINAL  Final   Organism ID, Bacteria STAPHYLOCOCCUS AUREUS  Final      Susceptibility   Staphylococcus aureus - MIC*    CIPROFLOXACIN >=8 RESISTANT Resistant     ERYTHROMYCIN RESISTANT Resistant     GENTAMICIN <=0.5 SENSITIVE Sensitive     OXACILLIN 0.5 SENSITIVE Sensitive     TETRACYCLINE <=1 SENSITIVE Sensitive     VANCOMYCIN <=0.5 SENSITIVE Sensitive     TRIMETH/SULFA <=10 SENSITIVE Sensitive     CLINDAMYCIN RESISTANT Resistant     RIFAMPIN <=0.5 SENSITIVE Sensitive     Inducible  Clindamycin POSITIVE Resistant     * RARE STAPHYLOCOCCUS AUREUS         Radiology Studies: No results found.      Scheduled Meds:  allopurinol  100 mg Oral Daily   amiodarone  200 mg Oral Daily   apixaban  5 mg Oral BID  vitamin C  500 mg Oral BID   Chlorhexidine Gluconate Cloth  6 each Topical Daily   diltiazem  90 mg Oral Q12H   feeding supplement  237 mL Oral TID BM   fluconazole  200 mg Oral Daily   furosemide  40 mg Intravenous Daily   guaiFENesin  600 mg Oral BID   leptospermum manuka honey  1 Application Topical Daily   metoprolol tartrate  25 mg Oral BID   metroNIDAZOLE  500 mg Oral Q12H   midodrine  5 mg Oral TID WC   morphine  15 mg Oral Q12H   multivitamin with minerals  1 tablet Oral Daily   mupirocin ointment   Nasal BID   mouth rinse  15 mL Mouth Rinse 4 times per day   pantoprazole  40 mg Oral Daily   sodium chloride flush  10-40 mL Intracatheter Q12H   vitamin A  10,000 Units Oral Daily   zinc sulfate  220 mg Oral Daily   Continuous Infusions:  sodium chloride Stopped (03/02/22 1233)    ceFAZolin (ANCEF) IV 2 g (03/04/22 0606)     LOS: 12 days    Sidney Ace, MD [facility]   If 7PM-7AM, please contact night-coverage  03/04/2022, 10:49 AM

## 2022-03-04 NOTE — Progress Notes (Signed)
Pt's wife has appointments all morning Monday morning 03/05/22. States she will be at bedside after. Please keep this in consideration when making rounds/updates.

## 2022-03-05 ENCOUNTER — Inpatient Hospital Stay: Payer: PPO

## 2022-03-05 DIAGNOSIS — J9601 Acute respiratory failure with hypoxia: Secondary | ICD-10-CM | POA: Diagnosis not present

## 2022-03-05 DIAGNOSIS — I4891 Unspecified atrial fibrillation: Secondary | ICD-10-CM

## 2022-03-05 DIAGNOSIS — A419 Sepsis, unspecified organism: Secondary | ICD-10-CM | POA: Diagnosis not present

## 2022-03-05 DIAGNOSIS — M8618 Other acute osteomyelitis, other site: Secondary | ICD-10-CM | POA: Diagnosis not present

## 2022-03-05 DIAGNOSIS — R6521 Severe sepsis with septic shock: Secondary | ICD-10-CM | POA: Diagnosis not present

## 2022-03-05 DIAGNOSIS — A4901 Methicillin susceptible Staphylococcus aureus infection, unspecified site: Secondary | ICD-10-CM | POA: Diagnosis not present

## 2022-03-05 LAB — CBC
HCT: 41.8 % (ref 39.0–52.0)
Hemoglobin: 13.1 g/dL (ref 13.0–17.0)
MCH: 32.3 pg (ref 26.0–34.0)
MCHC: 31.3 g/dL (ref 30.0–36.0)
MCV: 103 fL — ABNORMAL HIGH (ref 80.0–100.0)
Platelets: 496 10*3/uL — ABNORMAL HIGH (ref 150–400)
RBC: 4.06 MIL/uL — ABNORMAL LOW (ref 4.22–5.81)
RDW: 16.1 % — ABNORMAL HIGH (ref 11.5–15.5)
WBC: 24.3 10*3/uL — ABNORMAL HIGH (ref 4.0–10.5)
nRBC: 0 % (ref 0.0–0.2)

## 2022-03-05 LAB — CBC WITH DIFFERENTIAL/PLATELET
Abs Immature Granulocytes: 0.17 10*3/uL — ABNORMAL HIGH (ref 0.00–0.07)
Basophils Absolute: 0.1 10*3/uL (ref 0.0–0.1)
Basophils Relative: 0 %
Eosinophils Absolute: 0.1 10*3/uL (ref 0.0–0.5)
Eosinophils Relative: 1 %
HCT: 38.7 % — ABNORMAL LOW (ref 39.0–52.0)
Hemoglobin: 12.3 g/dL — ABNORMAL LOW (ref 13.0–17.0)
Immature Granulocytes: 1 %
Lymphocytes Relative: 5 %
Lymphs Abs: 0.9 10*3/uL (ref 0.7–4.0)
MCH: 32.2 pg (ref 26.0–34.0)
MCHC: 31.8 g/dL (ref 30.0–36.0)
MCV: 101.3 fL — ABNORMAL HIGH (ref 80.0–100.0)
Monocytes Absolute: 1.1 10*3/uL — ABNORMAL HIGH (ref 0.1–1.0)
Monocytes Relative: 6 %
Neutro Abs: 16.3 10*3/uL — ABNORMAL HIGH (ref 1.7–7.7)
Neutrophils Relative %: 87 %
Platelets: 448 10*3/uL — ABNORMAL HIGH (ref 150–400)
RBC: 3.82 MIL/uL — ABNORMAL LOW (ref 4.22–5.81)
RDW: 16.2 % — ABNORMAL HIGH (ref 11.5–15.5)
WBC: 18.6 10*3/uL — ABNORMAL HIGH (ref 4.0–10.5)
nRBC: 0 % (ref 0.0–0.2)

## 2022-03-05 LAB — HEPATIC FUNCTION PANEL
ALT: <5 U/L (ref 0–44)
AST: 17 U/L (ref 15–41)
Albumin: 2.2 g/dL — ABNORMAL LOW (ref 3.5–5.0)
Alkaline Phosphatase: 103 U/L (ref 38–126)
Bilirubin, Direct: 0.2 mg/dL (ref 0.0–0.2)
Indirect Bilirubin: 0.9 mg/dL (ref 0.3–0.9)
Total Bilirubin: 1.1 mg/dL (ref 0.3–1.2)
Total Protein: 6.5 g/dL (ref 6.5–8.1)

## 2022-03-05 LAB — BASIC METABOLIC PANEL
Anion gap: 8 (ref 5–15)
BUN: 12 mg/dL (ref 8–23)
CO2: 31 mmol/L (ref 22–32)
Calcium: 7.8 mg/dL — ABNORMAL LOW (ref 8.9–10.3)
Chloride: 101 mmol/L (ref 98–111)
Creatinine, Ser: 0.43 mg/dL — ABNORMAL LOW (ref 0.61–1.24)
GFR, Estimated: >60 mL/min (ref >60)
Glucose, Bld: 105 mg/dL — ABNORMAL HIGH (ref 70–99)
Potassium: 3.7 mmol/L (ref 3.5–5.1)
Sodium: 140 mmol/L (ref 135–145)

## 2022-03-05 LAB — BLOOD GAS, ARTERIAL
Acid-Base Excess: 10.9 mmol/L — ABNORMAL HIGH (ref 0.0–2.0)
Bicarbonate: 35.8 mmol/L — ABNORMAL HIGH (ref 20.0–28.0)
O2 Saturation: 95.1 %
Patient temperature: 37
pCO2 arterial: 47 mmHg (ref 32–48)
pH, Arterial: 7.49 — ABNORMAL HIGH (ref 7.35–7.45)
pO2, Arterial: 71 mmHg — ABNORMAL LOW (ref 83–108)

## 2022-03-05 LAB — MAGNESIUM: Magnesium: 1.9 mg/dL (ref 1.7–2.4)

## 2022-03-05 LAB — GLUCOSE, CAPILLARY: Glucose-Capillary: 114 mg/dL — ABNORMAL HIGH (ref 70–99)

## 2022-03-05 LAB — PROCALCITONIN: Procalcitonin: <0.1 ng/mL

## 2022-03-05 LAB — LACTIC ACID, PLASMA: Lactic Acid, Venous: 1.4 mmol/L (ref 0.5–1.9)

## 2022-03-05 LAB — BRAIN NATRIURETIC PEPTIDE: B Natriuretic Peptide: 192.7 pg/mL — ABNORMAL HIGH (ref 0.0–100.0)

## 2022-03-05 MED ORDER — AMIODARONE HCL IN DEXTROSE 360-4.14 MG/200ML-% IV SOLN
60.0000 mg/h | INTRAVENOUS | Status: AC
  Administered 2022-03-05: 60 mg/h via INTRAVENOUS
  Filled 2022-03-05: qty 200

## 2022-03-05 MED ORDER — IPRATROPIUM-ALBUTEROL 0.5-2.5 (3) MG/3ML IN SOLN
3.0000 mL | Freq: Four times a day (QID) | RESPIRATORY_TRACT | Status: DC
  Administered 2022-03-05 – 2022-03-06 (×3): 3 mL via RESPIRATORY_TRACT
  Filled 2022-03-05 (×5): qty 3

## 2022-03-05 MED ORDER — FUROSEMIDE 10 MG/ML IJ SOLN
40.0000 mg | Freq: Once | INTRAMUSCULAR | Status: AC
  Administered 2022-03-05: 40 mg via INTRAVENOUS

## 2022-03-05 MED ORDER — AMIODARONE HCL IN DEXTROSE 360-4.14 MG/200ML-% IV SOLN
30.0000 mg/h | INTRAVENOUS | Status: DC
  Administered 2022-03-06 – 2022-03-07 (×3): 30 mg/h via INTRAVENOUS
  Filled 2022-03-05 (×3): qty 200

## 2022-03-05 MED ORDER — DILTIAZEM HCL 30 MG PO TABS
30.0000 mg | ORAL_TABLET | Freq: Once | ORAL | Status: AC
  Administered 2022-03-05: 30 mg via ORAL
  Filled 2022-03-05: qty 1

## 2022-03-05 MED ORDER — PIPERACILLIN-TAZOBACTAM 3.375 G IVPB
3.3750 g | Freq: Three times a day (TID) | INTRAVENOUS | Status: AC
  Administered 2022-03-05 – 2022-03-06 (×4): 3.375 g via INTRAVENOUS
  Filled 2022-03-05 (×4): qty 50

## 2022-03-05 MED ORDER — DILTIAZEM HCL ER 60 MG PO CP12
120.0000 mg | ORAL_CAPSULE | Freq: Two times a day (BID) | ORAL | Status: DC
  Administered 2022-03-05 – 2022-03-07 (×4): 120 mg via ORAL
  Filled 2022-03-05 (×4): qty 2

## 2022-03-05 MED ORDER — FUROSEMIDE 10 MG/ML IJ SOLN
INTRAMUSCULAR | Status: AC
  Filled 2022-03-05: qty 4

## 2022-03-05 NOTE — Significant Event (Signed)
Rapid Response Event Note   Reason for Call :  Hypoxia, altered mental status, increased work of breathing  Initial Focused Assessment:  Rapid response RN arrived in patient's room shortly followed by patient's attending MD. Patient was surrounded by RT and 2A staff. Per report from 2A staff, patient had an oxygen mask on but it was removed and patient was lethargic, with hypoxia, and increased work of breathing so 2A staff had placed patient on NRB. At time of rapid arrival patient had oxygen saturation of mid 90s on the NRB and with increased work of breathing and abdominal muscles use. Patient was lethargic but could arouse to voice. Speech difficult to understand at times due to the oxygen mask. Lungs with coarse crackles and diminished to auscultation, with right more so than left. Recent CBG 114. Vital signs showed BP 110/76, HR 130s, RR in mid to upper 30s.  Interventions:  ABG obtained by RT. Lasix administered per MD order. MD also placed new orders like labs, bipap, and chest xray.  Plan of Care:  Patient transferred to ICU 8. Once in ICU 8 patient transitioned to bipap. Dois [place] RN got report once patient arrived in ICU.  Event Summary:   MD Notified: Dr. Georgeann Oppenheim Call Time: 17:20 Arrival Time: 17:21 End Time: 17:58  Reis Pienta, Theresia Lo, RN

## 2022-03-05 NOTE — Progress Notes (Signed)
Inpatient Rehab Admissions Coordinator:   Per therapy recommendations, patient was screened for CIR candidacy by Megan Salon, MS, CCC-SLP. At this time, Pt. is not yet able to tolerate intensity of CIR.   Pt. may have potential to progress to becoming a potential CIR candidate, so CIR admissions team will follow and monitor for progress and participation with therapies and place consult order if Pt. appears to be an appropriate candidate. In order to be considered, it would be helpful to see how patient transfers and to see him getting up to the chair. Please contact me with any questions.   Megan Salon, MS, CCC-SLP Rehab Admissions Coordinator  [phone] (celll) [phone] (office)

## 2022-03-05 NOTE — Progress Notes (Signed)
PT Cancellation Note  Patient Details Name: Jesse Whitney MRN: [id number] DOB: [birth year]   Cancelled Treatment:    Reason Eval/Treat Not Completed: Patient not medically ready (3 attempts made today. First two attempts, patient needing to be cleaned with foley care after bowel movement. One the third attempt, nurse requesting to hold due to hypoxia and elevated heart rate requiring new Cardizem drip. PT to follow up tomorrow.)  Donna Bernard, PT, MPT  Ina Homes 03/05/2022, 2:28 PM

## 2022-03-05 NOTE — Progress Notes (Signed)
Patient pulling off nrb mask. Attempted to put patient on heated high flow.  He keeps attempting to pull such off as well. Educated why o2 is necessary however he is adamant about not needing. RN at bedside of assistance.

## 2022-03-05 NOTE — Progress Notes (Signed)
Pharmacy Antibiotic Note  Jesse Whitney is a 66 y.o. male admitted on 02/20/2022 with sepsis.  Pharmacy has been consulted for Zosyn (piperacillin/tazobactam) dosing.  Plan: Zosyn (piperacillin/tazobactam) 3.375 grams every 8 hours (4 hour infusion)  Follow renal function, and ID plans.   Height: 5\' 7"  (170.2 cm) Weight: 72.5 kg (159 lb 13.3 oz) IBW/kg (Calculated) : 66.1  Temp (24hrs), Avg:98.7 F (37.1 C), Min:97.4 F (36.3 C), Max:99.6 F (37.6 C)  Recent Labs  Lab 02/28/22 1658 02/28/22 2100 03/01/22 0552 03/02/22 0921 03/03/22 0337 03/04/22 0810 03/05/22 0905 03/05/22 1727  WBC 16.9*  --  15.0* 16.1* 18.4* 19.5* 18.6* 24.3*  CREATININE 0.67 0.64 0.45* 0.48*  --  0.49* 0.43*  --   LATICACIDVEN 0.9  --   --   --   --   --   --  1.4    Estimated Creatinine Clearance: 84.9 mL/min (A) (by C-G formula based on SCr of 0.43 mg/dL (L)).    Allergies  Allergen Reactions   Contrast Media [Iodinated Contrast Media] Nausea And Vomiting   Iohexol Nausea And Vomiting    Antimicrobials this admission: Cefazolin every 8 hours 11/23 >> 12/4 Metronidazole 12/1 >> 12/4 Fluconazole 12/1 >>  Zosyn 12/4 >>  Dose adjustments this admission: N/a  Microbiology results: 11/24 BCx: MSSA   Thank you for allowing pharmacy to be a part of this patient's care.  12/24 03/05/2022 6:32 PM

## 2022-03-05 NOTE — Progress Notes (Signed)
Patient on bipap. Arrived to find him pulling mask off every time mask was replaced on his face.  He refuses to wear. RN placed back on nrb. Educated why oxygen therapy is important as he wasn't very tolerable of mask either. Will continue to monitor.

## 2022-03-05 NOTE — Progress Notes (Signed)
PROGRESS NOTE    Jesse Whitney  ZJI:[id number] DOB: November 01, 1955 DOA: 02/20/2022 PCP: [name], PA-C    Brief Narrative:  66 year old male with functional paraplegia presented to the emergency room on 11/21 after being sent over by his PCP for evaluation of high white blood cell count with extended workup revealing septic shock secondary to osteomyelitis of right ischial tuberosity from pressure injury.  Blood cultures positive for MSSA.  Infectious disease following.  Patient underwent TEE on 11/27 which was unremarkable.  PICC line placed 11/28.  Patient noted to have significant scrotal edema and BNP checked found to be elevated and patient started on IV Lasix, with significant response, diuresing almost 5 L and is more than -2 L deficient.   On late afternoon of 11/29, patient went into significant respiratory distress requiring strong supplemental oxygen and having mental status change.  Stroke ruled out by CT.  Patient transferred to ICU.  Chest x-ray not showing significant increase in fluid.  That night, with respiratory therapy help, patient coughed up large volume mucous plug and after that, breathing significantly improved and by morning of 11/30, able to be weaned off of oxygen altogether.  Mentation normalized.  12/1: Patient remains in rapid atrial fibrillation.  Volume status improved.  Of note patient was upset this morning.  Per patient and wife he was left overnight without a bear hugger.  Unclear true etiology of events.  Patient's mental status has been somewhat waxing and waning.  12/2: Patient transferred to medical floor.  Appears much more stable this morning.  Mental status intact.  12/3: Patient's family members were concerned about agitation and delirium yesterday.  Felt to be attributed to Valium.  Valium stopped.  White blood cell count still going up on 12/3.  Heart rate has been difficult to control.  12/4: Volume was found to be a chronic home medication.   Has been restarted.  No acute status changes overnight.  White count starting to downtrend.     Assessment & Plan:   Principal Problem:   Septic shock ([place]) Active Problems:   Decubitus ulcer of ischial area, right, stage II (HCC)   Osteomyelitis (HCC)   Permanent atrial fibrillation (HCC)   MSSA (methicillin susceptible Staphylococcus aureus)   Bacteremia due to methicillin susceptible Staphylococcus aureus (MSSA)  Methicillin-susceptible Staph aureus septic shock secondary to right decubitus ulcer right buttock.  Septic shock resolved. Weaned off of vasopressor.  ID following.  Following TEE, will determine length of time for IV Ancef.  Patient met criteria for septic shock on admission given tachycardia and tachypnea plus persistent hypotension requiring pressor support.   Weaned off pressors and transferred to general medical floor on 12/2 White count continues to be elevated and uptrending Plan: Continue Ancef  Added Flagyl on 12/1 Added fluconazole 12/1 Daily labs Monitor vitals and fever curve Discontinue midodrine   Acute on chronic respiratory failure with hypoxia-resolved Felt to be secondary to mucous plug.  Continue to monitor.  Patient remains on room air  Acute metabolic encephalopathy Secondary to hypoxia brought on by mucous plug.  Somewhat agitated at times.  Possible hospital-acquired delirium.  Seems improved after transfer out of ICU.     Right tibial plateau fracture status post recent fall -- patient was seen by Dr. Mack Guise orthopedic who recommends no surgical intervention. 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 Dr. Mack Guise recommends follow-up in the office in 1 to 2 weeks to see if patient would be a candidate for  telescoping knee brace for immobilization.  As needed pain medication.   Overweight Meets criteria with BMI greater than 25   Acute on chronic a fib with RVR in the setting of sepsis Rate control has been  difficult to achieve.  Cardizem, metoprolol, amiodarone, Eliquis.  Telemetry monitoring.  Cardiology follow-up   Spinal cord injury with functional quadriplegia and neurogenic bladder and autonomic dysreflexia -- symptomatic care   Acute on chronic systolic heart failure: Echocardiogram in September of this year noted preserved ejection fraction and grade 2 diastolic dysfunction and echocardiogram done 11/23 noted ejection fraction of 35 to 79% with no diastolic dysfunction noted.  TEE notes preserved ejection fraction.  Cardiology with treatment of sepsis and rate controlled atrial fibrillation, this accounts for differences.    Started on IV Lasix plus albumin after noted to have intermittent episodes of borderline hypoxia BNP of 500.  He continues to respond well, diuresing almost 10 L of fluid.   Net - 5.5 L as of 12/ 3 Plan: Diuretic stopped   Hypomagnesemia/hypokalemia  Monitor and replace as necessary.   DVT prophylaxis: Eliquis Code Status: DNR Family Communication: Spouse at bedside 12/1, niece at bedside 12/2 Disposition Plan: Status is: Inpatient Remains inpatient appropriate because: Resolving septic shock.  Remains on IV antibiotics.   Level of care: Progressive  Consultants:  ID Neurology  Procedures:  TEE  Antimicrobials: Ancef Flagyl Diflucan   Subjective: Seen and examined.  On Retail banker.  Objective: Vitals:   03/04/22 2234 03/05/22 0111 03/05/22 0516 03/05/22 0727  BP:  108/74 (!) 145/96 (!) 142/86  Pulse: (!) 120 95 98 (!) 109  Resp:  _0 Temp:  99.6 F (37.6 C) 98.9 F (37.2 C) 99.1 F (37.3 C)  TempSrc:  Oral Oral Oral  SpO2:  92% 95% 90%  Weight:      Height:        Intake/Output Summary (Last 24 hours) at 03/05/2022 1055 Last data filed at 03/05/2022 0500 Gross per 24 hour  Intake 737.97 ml  Output 2200 ml  Net -1462.03 ml   Filed Weights   03/01/22 0600 03/02/22 0200 03/04/22 0500  Weight: 79.4 kg 77.4 kg 72.5 kg     Examination:  General exam: Appears fatigued.  Flattened affect Respiratory system: Lungs clear.  Normal work of breathing.  Room air Cardiovascular system: S1-S2, tachycardic, irregular rhythm, no murmurs Gastrointestinal system: Soft, NT/ND, normal bowel sounds. Central nervous system: Spastic paraplegia, stable Extremities: Symmetric 5 x 5 power. Skin: Stage IV sacral decubitus ulcer Psychiatry: Judgement and insight appear impaired. Mood & affect depressed.     Data Reviewed: I have personally reviewed following labs and imaging studies  CBC: Recent Labs  Lab 02/28/22 1658 03/01/22 0552 03/02/22 0921 03/03/22 0337 03/04/22 0810 03/05/22 0905  WBC 16.9* 15.0* 16.1* 18.4* 19.5* 18.6*  NEUTROABS 14.9*  --   --   --   --  16.3*  HGB 12.2* 11.3* 11.6* 13.0 13.7 12.3*  HCT 38.9* 34.2* 36.6* 41.4 42.9 38.7*  MCV 102.4* 100.3* 100.8* 100.5* 100.5* 101.3*  PLT 363 311 359 455* 512* 432*   Basic Metabolic Panel: Recent Labs  Lab 02/28/22 2100 03/01/22 0552 03/02/22 0921 03/03/22 0337 03/04/22 0810 03/05/22 0500 03/05/22 0905  NA 145 144 142  --  140  --  140  K 3.6 3.2* 3.0*  --  4.0  --  3.7  CL 109 107 103  --  100  --  101  CO2 31 32  32  --  33*  --  31  GLUCOSE 103* 104* 98  --  104*  --  105*  BUN _0 --  11  --  12  CREATININE 0.64 0.45* 0.48*  --  0.49*  --  0.43*  CALCIUM 7.2* 7.2* 7.1*  --  7.9*  --  7.8*  MG 1.5* 1.9 1.5* 1.9 1.7 1.9  --    GFR: Estimated Creatinine Clearance: 84.9 mL/min (A) (by C-G formula based on SCr of 0.43 mg/dL (L)). Liver Function Tests: Recent Labs  Lab 02/28/22 1658  AST 14*  ALT <5  ALKPHOS 81  BILITOT 0.7  PROT 6.0*  ALBUMIN 2.3*   No results for input(s): "LIPASE", "AMYLASE" in the last 168 hours. No results for input(s): "AMMONIA" in the last 168 hours. Coagulation Profile: No results for input(s): "INR", "PROTIME" in the last 168 hours. Cardiac Enzymes: No results for input(s): "CKTOTAL", "CKMB",  "CKMBINDEX", "TROPONINI" in the last 168 hours. BNP (last 3 results) No results for input(s): "PROBNP" in the last 8760 hours. HbA1C: No results for input(s): "HGBA1C" in the last 72 hours. CBG: Recent Labs  Lab 02/28/22 1616 02/28/22 1649  GLUCAP 82 92   Lipid Profile: No results for input(s): "CHOL", "HDL", "LDLCALC", "TRIG", "CHOLHDL", "LDLDIRECT" in the last 72 hours. Thyroid Function Tests: No results for input(s): "TSH", "T4TOTAL", "FREET4", "T3FREE", "THYROIDAB" in the last 72 hours. Anemia Panel: No results for input(s): "VITAMINB12", "FOLATE", "FERRITIN", "TIBC", "IRON", "RETICCTPCT" in the last 72 hours. Sepsis Labs: Recent Labs  Lab 02/28/22 1658 03/01/22 0552  PROCALCITON 0.13 0.10  LATICACIDVEN 0.9  --     Recent Results (from the past 240 hour(s))  Aerobic Culture w Gram Stain (superficial specimen)     Status: None   Collection Time: 02/23/22  4:52 PM   Specimen: Wound  Result Value Ref Range Status   Specimen Description   Final    WOUND Performed at [facility], [address], [place], [place] [address]    Special Requests ILIAC RIGHT  Final   Gram Stain   Final    NO WBC SEEN NO ORGANISMS SEEN Performed at [facility], [place] [address] [place], [place] [address]    Culture   Final    RARE STAPHYLOCOCCUS AUREUS RARE CANDIDA ALBICANS    Report Status 02/26/2022 FINAL  Final   Organism ID, Bacteria STAPHYLOCOCCUS AUREUS  Final      Susceptibility   Staphylococcus aureus - MIC*    CIPROFLOXACIN >=8 RESISTANT Resistant     ERYTHROMYCIN RESISTANT Resistant     GENTAMICIN <=0.5 SENSITIVE Sensitive     OXACILLIN 0.5 SENSITIVE Sensitive     TETRACYCLINE <=1 SENSITIVE Sensitive     VANCOMYCIN <=0.5 SENSITIVE Sensitive     TRIMETH/SULFA <=10 SENSITIVE Sensitive     CLINDAMYCIN RESISTANT Resistant     RIFAMPIN <=0.5 SENSITIVE Sensitive     Inducible Clindamycin POSITIVE Resistant     * RARE STAPHYLOCOCCUS AUREUS          Radiology Studies: No results found.      Scheduled Meds:  allopurinol  100 mg Oral Daily   amiodarone  200 mg Oral Daily   apixaban  5 mg Oral BID   vitamin C  500 mg Oral BID   Chlorhexidine Gluconate Cloth  6 each Topical Daily   diazepam  5 mg Oral Q6H   diltiazem  90 mg Oral Q12H   feeding supplement  237  mL Oral TID BM   fluconazole  200 mg Oral Daily   guaiFENesin  600 mg Oral BID   leptospermum manuka honey  1 Application Topical Daily   metoprolol tartrate  25 mg Oral BID   metroNIDAZOLE  500 mg Oral Q12H   morphine  15 mg Oral Q12H   multivitamin with minerals  1 tablet Oral Daily   mupirocin ointment   Nasal BID   mouth rinse  15 mL Mouth Rinse 4 times per day   pantoprazole  40 mg Oral Daily   sodium chloride flush  10-40 mL Intracatheter Q12H   vitamin A  10,000 Units Oral Daily   zinc sulfate  220 mg Oral Daily   Continuous Infusions:  sodium chloride Stopped (03/02/22 1233)    ceFAZolin (ANCEF) IV 2 g (03/05/22 0545)     LOS: 13 days    Sidney Ace, MD [facility]   If 7PM-7AM, please contact night-coverage  03/05/2022, 10:55 AM

## 2022-03-05 NOTE — Progress Notes (Signed)
OT Cancellation Note  Patient Details Name: Jesse Whitney MRN: [id number] DOB: 02/28/1956   Cancelled Treatment:    Reason Eval/Treat Not Completed: Medical issues which prohibited therapy. OT making three attempts to see pt this afternoon. On third attempt, RN in room reporting pt is now hypoxic and being started on new cardizem drip. OT re-attempt when pt is medically able to participate.   Jackquline Denmark, MS, OTR/L , CBIS ascom [phone]  03/05/22, 2:35 PM

## 2022-03-05 NOTE — Progress Notes (Signed)
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 ulcer of ischial area, right, stage II (HCC)   Osteomyelitis (HCC)   Permanent atrial fibrillation (HCC)   MSSA (methicillin susceptible Staphylococcus aureus)   Bacteremia due to  methicillin susceptible Staphylococcus aureus (MSSA)    ASSESSMENT AND PLAN:  Jesse Whitney is a 66yo paraplegic male with a PMH of SCI (C5-C7), autonomic dysreflexia, neurogenic bladder (self catheterizes 4x daily), 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  He is being treated for septic shock 2/2 right ischial decubitus ulcer. Blood cultures positive for MSSA.  Hospital course has been complicated by A-fib with RVR and volume overload. TEE performed 11/27 was negative for vegetations, ruling out endocarditis.  # sepsis 2/2 MSSA bacteremia, right ischial decubitus ulcer  Infectious disease following, currently on cefazolin and Flagyl.  TEE negative for vegetations.  # paroxysmal AF RVR In  the setting of sepsis and bacteremia. Suspect that as volume status improves and infection clears that rate will be better controlled.  -monitor and replete electrolytes for K>4 and Mg>2 -s/p IV amio load and gtt, PO amiodarone 200 twice a day for 4 days. -Continue amio PO  once daily.  -agree with weaning midodrine from  to , continue to wean until stopped entirely -increase diltiazem to  SR BID  -continue metoprolol tartrate  25 BID -s/p heparin gtt. continue eliquis  BID. CHADS2-VASC 2 (hx DVT)   # acute on chronic HFpEF  S/p rapid response the afternoon of 11/29 where patient's breathing was labored and was hypoxic on room air to 80s. Improved following lasix administration and was transferred to stepdown. CT head unremarkable, patient and wife refused MRI.  - s/p several days of IV lasix with excellent diuresis, net neg 12L now.  -PRN lasix moving forward  - discussed with Dr. Rito Ehrlich after reviewing TTE and TEE reports noting EF discrepancies (35-40% on TTE 11/21 & 60-65% on TEE 11/27) with Dr. Gwen Pounds (interpreting physician of both of these studies) who suspects that with treatment of sepsis & bacteremia and rate control of his AF reflect the improvement in his EF appropriately.   Will arrange for follow up with Dr. Darrold Junker, 1-2 weeks after discharge. Prefers PM appointments.   This patient's plan of care was discussed and created with Dr. Gwen Pounds and he is in agreement.  Signed: Rebeca Allegra , PA-C 03/05/2022, 1:52 PM [facility]

## 2022-03-05 NOTE — Progress Notes (Signed)
Patient sat dropped to upper 80's with RN, Patient does not want to wear [place] but wanted a mask due to how uncomfortable the cannula is for him. RN placed on NRB initially and this RT changed to 35% venturi mask. Patients sats remained above 90%. Left on venturi mask, RN aware.

## 2022-03-05 NOTE — Progress Notes (Signed)
   03/05/22 1713  Assess: MEWS Score  Pulse Rate (!) 130  SpO2 (!) 52 %  O2 Device Room Air (Pt had taken off venturi mask at one point per wife)  Patient Activity (if Appropriate) In bed  Assess: MEWS Score  MEWS Temp 0  MEWS Systolic 1  MEWS Pulse 3  MEWS RR 0  MEWS LOC 0  MEWS Score 4  MEWS Score Color Red  Assess: if the MEWS score is Yellow or Red  Were vital signs taken at a resting state? Yes  Focused Assessment Change from prior assessment (see assessment flowsheet)  Does the patient meet 2 or more of the SIRS criteria? Yes  Does the patient have a confirmed or suspected source of infection? Yes  Provider and Rapid Response Notified? Yes  Treat  MEWS Interventions Escalated (See documentation below)  Escalate  MEWS: Escalate Red: discuss with charge nurse/RN and provider, consider discussing with RRT  Notify: Charge Nurse/RN  Name of Charge Nurse/RN Notified Amy Dalton< RN  Date Charge Nurse/RN Notified 03/05/22  Time Charge Nurse/RN Notified 1716  Provider Notification  Provider Name/Title Lolita Patella, MD  Date Provider Notified 03/05/22  Time Provider Notified 1719  Method of Notification Page  Notification Reason Change in status  Provider response At bedside  Date of Provider Response 03/05/22  Time of Provider Response 1721  Notify: Rapid Response  Date Rapid Response Notified 03/05/22  Time Rapid Response Notified  (not sure of exact time, sometime betweem 1720-1725)  Document  Patient Outcome Transferred/level of care increased  Progress note created (see row info) Yes  Assess: SIRS CRITERIA  SIRS Temperature  0  SIRS Pulse 1  SIRS Respirations  0  SIRS WBC 1  SIRS Score Sum  2

## 2022-03-05 NOTE — IPAL (Signed)
  Interdisciplinary Goals of Care Family Meeting   Date carried out: 03/05/2022  Location of the meeting: Phone conference  Member's involved: Nurse Practitioner and Family Member or next of kin  Durable Power of Attorney or acting medical decision maker: Jesse Whitney, spouse    Discussion: We discussed goals of care for Jesse Whitney. The patient was a rapid response earlier due to hypercarbia and lethargy requiring BIPAP support. The patient's CO2 has now normalized and he continued to remove the BIPAP. However the patient is requiring HHFNC at 95% and 55 L due to hypoxia. He stated he is "tired" and does not want to wear the oxygen. The patient is more awake but still appears confused. Updated the spouse who understands that if the patient continues to remove his oxygen he is at a high risk of cardiac arrest. She said she understands and that if he refuses his oxygen he refuses. We briefly touched on comfort measures, recognizing that if he does not want oxygen support this may need to be considered. She said she understands and would like a call if he deteriorates overnight. Patient is currently maintaining SpO2 > 88% on HHFNC.  Code status: Full DNR  Disposition: Continue current acute care  Time spent for the meeting: 15 minutes    Cecelia Byars Rust-Chester, NP  03/05/2022, 9:33 PM

## 2022-03-05 NOTE — Progress Notes (Signed)
Chaplain provided wife, Jesse Whitney support during rapid response. Chaplain offered listening presence, validation, and advocacy. She knows his wishes of not having aggressive care. Til this admission he had been independent. She is supported by friends and extended family. Frustrated with new wound and the recent decline as stated "I thought he was getting better."    03/05/22 1800  Clinical Encounter Type  Visited With Patient and family together  Visit Type Critical Care  Spiritual Encounters  Spiritual Needs Emotional

## 2022-03-05 NOTE — Progress Notes (Signed)
ID Spinal cord injury, quadriplegia, autonomic dysrelfexia, neurogenic bladder self cath, Afib RVR recent hospitalization. Pt fell out of his car when he was trying to get out and hurt his rt leg- a few days later it was swollen and discolored- His PCP had given him antibiotics ( levaquin+ flagyl_ for the rt ischial pressure wound and sent off lab work- as WBC was high he was asked to go to the ED On presentation to the ED on 02/20/22 His vitals were   02/20/22  BP 87/62 (L)  Temp 97.9 F (36.6 C)  Pulse Rate 60  Resp 10  SpO2 98 %      Labs   Latest Reference Range & Units 02/20/22  WBC 4.0 - 10.5 K/uL 20.2 (H)  Hemoglobin 13.0 - 17.0 g/dL 96.0 (L)  HCT 45.4 - 09.8 % 37.8 (L)  Platelets 150 - 400 K/uL 345  Creatinine 0.61 - 1.24 mg/dL 1.19  HE was admitted to ICU for pressors A CT rt leg showed subacute comminuted fracture of the rt tibial plateau  and fracture of the fibular head. There was asymmetric necrosis and periosteal reaction of the rt ischial tuberosity concerning for Osteo HE has had a wound for > 2 months and his wife cleans it His blood culture came positive for MSSA   Subjective  03/05/22- back to ICU because of resp distress Afib- RVR hypoxia  O/e pt is lethargic Patient Vitals for the past 24 hrs:  BP Temp Temp src Pulse Resp SpO2  03/05/22 1800 (!) 141/97 (!) 97.4 F (36.3 C) -- (!) 131 (!) 26 94 %  03/05/22 1744 -- -- -- -- (!) 32 97 %  03/05/22 1713 -- -- -- (!) 130 -- (!) 52 %  03/05/22 1557 100/78 98.5 F (36.9 C) Oral (!) 119 20 97 %  03/05/22 1444 -- -- -- -- -- 93 %  03/05/22 1418 -- -- -- -- -- 95 %  03/05/22 1415 -- -- -- (!) 110 -- (!) 87 %  03/05/22 1201 116/84 99.1 F (37.3 C) Oral (!) 111 18 93 %  03/05/22 0727 (!) 142/86 99.1 F (37.3 C) Oral (!) 109 20 90 %  03/05/22 0516 (!) 145/96 98.9 F (37.2 C) Oral 98 18 95 %  03/05/22 0111 108/74 99.6 F (37.6 C) Oral 95 16 92 %  03/04/22 2234 -- -- -- (!) 120 -- --  03/04/22 2229 (!) 134/95  98.2 F (36.8 C) Oral 70 20 95 %    Chest b/l air entry Hs- irregular-RVR  Pt has rt ischial tuberoisty pressure wound better than before Left ischial tissue injury Erythematous papur eruption left shoulder area Rt leg bruising improving   Labs    Latest Ref Rng & Units 03/05/2022    5:27 PM 03/05/2022    9:05 AM 03/04/2022    8:10 AM  CBC  WBC 4.0 - 10.5 K/uL 24.3  18.6  19.5   Hemoglobin 13.0 - 17.0 g/dL 14.7  82.9  56.2   Hematocrit 39.0 - 52.0 % 41.8  38.7  42.9   Platelets 150 - 400 K/uL 496  448  512        Latest Ref Rng & Units 03/05/2022    9:05 AM 03/04/2022    8:10 AM 03/02/2022    9:21 AM  CMP  Glucose 70 - 99 mg/dL 130  865  98   BUN 8 - 23 mg/dL 12  11  9    Creatinine 0.61 - 1.24 mg/dL  0.43  0.49  0.48   Sodium 135 - 145 mmol/L 140  140  142   Potassium 3.5 - 5.1 mmol/L 3.7  4.0  3.0   Chloride 98 - 111 mmol/L 101  100  103   CO2 22 - 32 mmol/L 31  33  32   Calcium 8.9 - 10.3 mg/dL 7.8  7.9  7.1     Micro 02/20/22- MSSA  both cultures 11/23 wound culture MSSA 02/22/22 BC- NG so far  Impression/recommendation  Acute hypoxia/altered mental status- back to ICU [place] for aspiration VS mucus plugs CXR done Change cefazolin to zosyn  Fall with fracture tibia and fibula of the rt side- extensive swelling and bruising  MSSA bacteremia- source  rt ischial wound which also has MSSA Rt ischial wound with possible underlying osteo of the bone  Repeat blood culture is neg so far TEE is neg for endocarditis  Currently on cefazolin and flagyl and fluconazole (03/08/22) Will give IV cefazolin for 4 weeks until 03/23/22 and then will switch to oral keflex 1 gram Q6 until 04/05/21 While on zosyn Dc cefazolin   Afib on amiodarone H/o Traumatic spine injury with quadriparesis  Autonomic dysreflexia  Discussed the management with care team and wife

## 2022-03-05 NOTE — Significant Event (Addendum)
RRT called.  Patient desaturating.   Gas without significant CO2 retention Patient lethargic, but mentating reasonably well MAPs reassuring Tachycardic, rapid afib Sats mid low 90s on NRB  Plan: BiPAP Transfer to SDU STAT CBC, BMP, LA STAT CXR  Close monitoring.  High risk of decompensation  Lolita Patella MD

## 2022-03-05 NOTE — Consult Note (Signed)
NAME:  Jesse Whitney, MRN:  [id number], DOB:  Nov 22, 1955, LOS: 13 ADMISSION DATE:  02/20/2022, CONSULTATION DATE:  02/20/22 REFERRING MD:  Dr. Derrill Kay, CHIEF COMPLAINT:  Fever  History of Present Illness:  66 yo M presenting to [facility] ED from home on 02/20/2022 for evaluation of high white blood cell count at the encouragement of the patient's PCP.   History provided by patient and wife bedside as well as chart review. 2-1/2 weeks ago the patient fell out of his wheelchair causing bruising to the right lower extremity.  Of note the patient is a quadriplegic and is wheelchair-bound.  Around the same time the patient reports poor p.o. intake, increased fatigue, increased right lower extremity swelling and he was evaluated by his PCP.  There was concern for possible cellulitis in the right lower extremity as well as possible infection of a wound on his buttocks.  He was started on Levaquin & Flagyl and lab work was sent off.  The patient was also seen by vascular and ultrasound was performed of the right lower extremity which was negative for DVT.  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 Of note he was admitted in September 2023 septic from a UTI, and during this admission he developed atrial fibrillation with rapid ventricular response which converted to normal sinus rhythm after diltiazem. He has been out of diltiazem for the past week, and was taken off the lopressor immediately after discharge due to low heart rate noted at a follow-up PCP appointment outpatient.   ED course: Upon arrival vital signs stable, however patient quickly became tachypneic, hypotensive with A-fib RVR.  Sepsis protocol initiated, and blood pressure appeared fluid responsive at first.  After 3 L IV fluid bolus however BP remains soft and peripheral vasopressors initiated.  Imaging revealed a subacute commuted fracture of the fibular head and subacute  commuted fracture of the right tibial plateau.  Orthopedics was consulted by EDP, however as patient is wheelchair-bound and quadriplegic no surgical intervention at this time.  Imaging also suggestive of osteomyelitis in pressure injury on right ischial tuberosity.  Lab work significant for mild hypokalemia, lactic acidosis, hypoalbuminemia & leukocytosis. Medications given: 4 mg of morphine, cefepime/ flagyl/ vancomycin, 3 L IVF bolus, levophed drip started Initial Vitals: 98.5, 17, 100 & 137/122 & 95% on RA Significant labs: (Labs/ Imaging personally reviewed) I, Cheryll Cockayne Rust-Chester, AGACNP-BC, personally viewed and interpreted this ECG. EKG Interpretation: Date: 02/20/22, EKG Time: 17:01, Rate: 175, Rhythm: A-fib RVR, QRS Axis:  normal, Intervals: a-fib, ST/T Wave abnormalities: none, Narrative Interpretation: Atrial Fibrillation with RVR Chemistry: Na+: 135, K+: 3.4, BUN/Cr.: 21/0.88, Serum CO2/ AG: 31/6, albumin: 2.0, CK: 27 Hematology: WBC: 20.2, Hgb: 12.3,  Lactic/ PCT: 2.8 > 2.7/ pending  CT femur RIGHT wo contrast 02/20/22:  Subacute comminuted fracture of the right tibial plateau with dissociation of the tibial metaphysis and proximal diaphysis (Schatzker type 6). Subacute comminuted fracture of the fibular head. 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 Small amount of subcutaneous emphysema in the inferior right buttock tracking towards the ischial tuberosity. correlate for decubitus ulcer. Large knee hemarthrosis. Significant diffuse soft tissue swelling of the visualized right leg. No abscess.   PCCM consulted for admission due to sepsis with shock secondary to osteomyelitis from pressure injury on right ischial tuberosity requiring peripheral vasopressor support.  Pertinent  Medical History  Autonomic dysreflexia Hypertension Quadriplegia following  spinal cord injury (1981) DVT on Eliquis Atrial  fibrillation Significant Hospital Events: Including procedures, antibiotic start and stop dates in addition to other pertinent events   11/21: Admit to ICU with septic shock secondary to osteomyelitis from pressure injury on right ischial tuberosity requiring peripheral vasopressor support.  Patient also in atrial fibrillation with rapid ventricular response. 12/4: Pt developed severe acute hypoxic respiratory failure rapid response initiated.  Pt transported to ICU on Bipap PCCM team reconsulted   Interim History / Subjective:  Pt remains on Bipap FiO2 @75 % with increased work of breathing with accessory muscle use.  Currently in atrial fibrillation with rvr hr 129-135  Objective   Blood pressure 100/78, pulse (!) 119, temperature 98.5 F (36.9 C), temperature source Oral, resp. rate (!) 32, height 5\' 7"  (1.702 m), weight 72.5 kg, SpO2 97 %.    FiO2 (%):  [35 %] 35 %   Intake/Output Summary (Last 24 hours) at 03/05/2022 1758 Last data filed at 03/05/2022 0500 Gross per 24 hour  Intake 675 ml  Output 0 ml  Net 675 ml   Filed Weights   03/01/22 0600 03/02/22 0200 03/04/22 0500  Weight: 79.4 kg 77.4 kg 72.5 kg    Examination: General: Acutely-ill male in severe respiratory distress on Bipap  HEENT: Dry mucous membranes, supple, JVD present Neuro: Awake, following some commands bilateral upper extremities; intermittent unequal pupils but reactive right 4 mm/left 2 mm CV: Irregular irregular, no r/g, 2+ radial/1+ distal pulses, 2+ bilateral lower extremity edema  Pulm: Diffuse rhonchi throughout but diminished in RUL, labored, tachypneic  GI: +BS x4, soft, non distended  Skin: pressure injury noted on RIGHT ischial tuberosity, scabbed abrasions on right wrist & ecchymosis and edema noted on right lower extremity Extremities: Pt is quadriplegic   Resolved Hospital Problem list     Assessment & Plan:  Acute hypoxic respiratory failure suspect secondary to mucous plugging and  aspiration   - Prn Bipap for dyspnea and/or hypoxia  - Maintain O2 sats 92% or above - Aggressive pulmonary hygiene  - Intermittent CXR   MSSDA bacteremia secondary to osteomyelitis from pressure injury on right ischial tuberosity - Trend WBC and monitor fever curve  - PCT pending - ID consulted appreciate input: abx broadened to zosyn for aspiration coverage  - Surgery consulted: determined no acute surgical needs   Paroxysmal Atrial Fibrillation with Rapid Ventricular Response Acute on chronic HFpEF  CHA2DS2-VASc score: 2 - Continuous telemetry monitoring - Will start amiodarone gtt until pt can resume po amiodarone/diltiazem/metoprolol - 40 mg iv lasix x1 dose  - If pt remains on Bipap will discontinue apixaban and start heparin gtt   Autonomic dysreflexia in the setting of chronic quadriplegia Chronic pain - Continue diazepam every 6 hours for muscle spasm control (this is how the patient takes it at home) - Continue outpatient oxycodone 10 mg every 4 hour as needed - Continue morphine (ms contin) 15 mg q12hr and prn morphine 2 mg q6hrs   Atonic neurogenic bladder - Continue indwelling foley catheter  - Strict I&O's  Chronic constipation Wife reports patient self administers Fleet enemas every other day in order to stimulate the passage of stool.  - Fleet enema ordered PRN  - Continue bowel regimen   Pressure injury on right ischial tuberosity-PTA - Wound care consulted appreciate input - Q2hr turns and utilize offloading devices  Subacute fracture of the right tibial plateau - Orthopedics consulted, no surgical intervention at this time - Outpatient follow-up in 1-2 weeks  following discharge   Acute metabolic encephalopathy with unequal pupils  - Once stabilized from respiratory standpoint will obtain CT Head to r/o acute CVA   Best Practice (right click and "Reselect all SmartList Selections" daily)  Diet/type: NPO for now while on Bipap DVT prophylaxis: Apixaban   GI prophylaxis: PPI Lines: N/A Foley:  N/A Code Status:  DNR Last date of multidisciplinary goals of care discussion [N/A]  Updated pts wife regarding pts condition and current plan of care  Labs   CBC: Recent Labs  Lab 02/28/22 1658 03/01/22 0552 03/02/22 0921 03/03/22 0337 03/04/22 0810 03/05/22 0905  WBC 16.9* 15.0* 16.1* 18.4* 19.5* 18.6*  NEUTROABS 14.9*  --   --   --   --  16.3*  HGB 12.2* 11.3* 11.6* 13.0 13.7 12.3*  HCT 38.9* 34.2* 36.6* 41.4 42.9 38.7*  MCV 102.4* 100.3* 100.8* 100.5* 100.5* 101.3*  PLT 363 311 359 455* 512* 448*    Basic Metabolic Panel: Recent Labs  Lab 02/28/22 2100 03/01/22 0552 03/02/22 0921 03/03/22 0337 03/04/22 0810 03/05/22 0500 03/05/22 0905  NA 145 144 142  --  140  --  140  K 3.6 3.2* 3.0*  --  4.0  --  3.7  CL 109 107 103  --  100  --  101  CO2 31 32 32  --  33*  --  31  GLUCOSE 103* 104* 98  --  104*  --  105*  BUN 15 12 9   --  11  --  12  CREATININE 0.64 0.45* 0.48*  --  0.49*  --  0.43*  CALCIUM 7.2* 7.2* 7.1*  --  7.9*  --  7.8*  MG 1.5* 1.9 1.5* 1.9 1.7 1.9  --    GFR: Estimated Creatinine Clearance: 84.9 mL/min (A) (by C-G formula based on SCr of 0.43 mg/dL (L)). Recent Labs  Lab 02/28/22 1658 03/01/22 0552 03/02/22 0921 03/03/22 0337 03/04/22 0810 03/05/22 0905  PROCALCITON 0.13 0.10  --   --   --   --   WBC 16.9* 15.0* 16.1* 18.4* 19.5* 18.6*  LATICACIDVEN 0.9  --   --   --   --   --     Liver Function Tests: Recent Labs  Lab 02/28/22 1658  AST 14*  ALT <5  ALKPHOS 81  BILITOT 0.7  PROT 6.0*  ALBUMIN 2.3*   No results for input(s): "LIPASE", "AMYLASE" in the last 168 hours. No results for input(s): "AMMONIA" in the last 168 hours.  ABG    Component Value Date/Time   PHART 7.49 (H) 03/05/2022 1727   PCO2ART 47 03/05/2022 1727   PO2ART 71 (L) 03/05/2022 1727   HCO3 35.8 (H) 03/05/2022 1727   O2SAT 95.1 03/05/2022 1727     Coagulation Profile: No results for input(s): "INR", "PROTIME" in  the last 168 hours.  Cardiac Enzymes: No results for input(s): "CKTOTAL", "CKMB", "CKMBINDEX", "TROPONINI" in the last 168 hours.   HbA1C: No results found for: "HGBA1C"  CBG: Recent Labs  Lab 02/28/22 1616 02/28/22 1649 03/05/22 1719  GLUCAP 82 92 114*    Review of Systems: Positives in bold  Gen: Denies fever, chills, weight change, fatigue, night sweats HEENT: Denies blurred vision, double vision, hearing loss, tinnitus, sinus congestion, rhinorrhea, sore throat, neck stiffness, dysphagia PULM: shortness of breath, cough, sputum production, hemoptysis, wheezing CV: Denies chest pain, edema, orthopnea, paroxysmal nocturnal dyspnea, palpitations GI: Denies abdominal pain, nausea, vomiting, diarrhea, hematochezia, melena, constipation, change in bowel habits GU: Denies  dysuria, hematuria, polyuria, oliguria, urethral discharge Endocrine: Denies hot or cold intolerance, polyuria, polyphagia or appetite change Derm: Denies rash, dry skin, scaling or peeling skin change Heme: Denies easy bruising, bleeding, bleeding gums Neuro: headache, numbness, weakness, slurred speech, loss of memory or consciousness  Past Medical History:  He,  has a past medical history of Autonomic dysreflexia, Hypertension, Quadriplegia following spinal cord injury (1981), and Spinal cord injury at C5-C7 level without injury of spinal bone (HCC).   Surgical History:   Past Surgical History:  Procedure Laterality Date   ANKLE SURGERY     BACK SURGERY     COLONOSCOPY     HEMORRHOID SURGERY  2008   PERIPHERAL VASCULAR THROMBECTOMY Right 11/24/2018   Procedure: PERIPHERAL VASCULAR THROMBECTOMY;  Surgeon: Annice Needy, MD;  Location: [facility] INVASIVE CV LAB;  Service: Cardiovascular;  Laterality: Right;   SPINE SURGERY     TEE WITHOUT CARDIOVERSION N/A 02/26/2022   Procedure: TRANSESOPHAGEAL ECHOCARDIOGRAM (TEE);  Surgeon: Lamar Blinks, MD;  Location: [facility] ORS;  Service: Cardiovascular;  Laterality: N/A;      Social History:   reports that he has never smoked. He quit smokeless tobacco use about 35 years ago.  His smokeless tobacco use included chew. He reports current alcohol use of about 1.0 standard drink of alcohol per week. He reports that he does not use drugs.   Family History:  His family history includes Breast cancer (age of onset: 62) in his mother.   Allergies Allergies  Allergen Reactions   Contrast Media [Iodinated Contrast Media] Nausea And Vomiting   Iohexol Nausea And Vomiting     Home Medications  Prior to Admission medications   Medication Sig Start Date End Date Taking? Authorizing Provider  allopurinol (ZYLOPRIM) 100 MG tablet Take 100 mg by mouth daily.    [provider]  apixaban (ELIQUIS) 5 MG TABS tablet Take 1 tablet (5 mg total) by mouth 2 (two) times daily. 02/13/22   Georgiana Spinner, NP  diazepam (VALIUM) 5 MG tablet Take 5 mg by mouth every 6 (six) hours as needed for anxiety.    [provider]  diltiazem (CARDIZEM CD) 180 MG 24 hr capsule Take 1 capsule (180 mg total) by mouth daily. 12/14/21   Burnadette Pop, MD  metoprolol tartrate (LOPRESSOR) 25 MG tablet Take 0.5 tablets (12.5 mg total) by mouth 2 (two) times daily. 12/13/21   Burnadette Pop, MD  morphine (MS CONTIN) 60 MG 12 hr tablet TAKE 2 TABLETS BY MOUTH TWICE A DAY FOR PAIN 11/03/18   [provider]  oxyCODONE (ROXICODONE) 15 MG immediate release tablet TAKE 1 TABLET BY MOUTH UP TO 3 TIMES DAILY FOR BREAKTHROUGH PAIN 12/04/17   [provider]     Critical care time: 55 minutes

## 2022-03-06 DIAGNOSIS — Z7189 Other specified counseling: Secondary | ICD-10-CM | POA: Diagnosis not present

## 2022-03-06 DIAGNOSIS — A419 Sepsis, unspecified organism: Secondary | ICD-10-CM | POA: Diagnosis not present

## 2022-03-06 DIAGNOSIS — R6521 Severe sepsis with septic shock: Secondary | ICD-10-CM | POA: Diagnosis not present

## 2022-03-06 LAB — CBC WITH DIFFERENTIAL/PLATELET
Abs Immature Granulocytes: 0.19 10*3/uL — ABNORMAL HIGH (ref 0.00–0.07)
Basophils Absolute: 0.1 10*3/uL (ref 0.0–0.1)
Basophils Relative: 0 %
Eosinophils Absolute: 0.1 10*3/uL (ref 0.0–0.5)
Eosinophils Relative: 1 %
HCT: 41.8 % (ref 39.0–52.0)
Hemoglobin: 13.1 g/dL (ref 13.0–17.0)
Immature Granulocytes: 1 %
Lymphocytes Relative: 4 %
Lymphs Abs: 0.8 10*3/uL (ref 0.7–4.0)
MCH: 32.3 pg (ref 26.0–34.0)
MCHC: 31.3 g/dL (ref 30.0–36.0)
MCV: 103 fL — ABNORMAL HIGH (ref 80.0–100.0)
Monocytes Absolute: 0.8 10*3/uL (ref 0.1–1.0)
Monocytes Relative: 4 %
Neutro Abs: 17.7 10*3/uL — ABNORMAL HIGH (ref 1.7–7.7)
Neutrophils Relative %: 90 %
Platelets: 477 10*3/uL — ABNORMAL HIGH (ref 150–400)
RBC: 4.06 MIL/uL — ABNORMAL LOW (ref 4.22–5.81)
RDW: 16.3 % — ABNORMAL HIGH (ref 11.5–15.5)
WBC: 19.6 10*3/uL — ABNORMAL HIGH (ref 4.0–10.5)
nRBC: 0 % (ref 0.0–0.2)

## 2022-03-06 LAB — BASIC METABOLIC PANEL
Anion gap: 7 (ref 5–15)
BUN: 15 mg/dL (ref 8–23)
CO2: 32 mmol/L (ref 22–32)
Calcium: 7.8 mg/dL — ABNORMAL LOW (ref 8.9–10.3)
Chloride: 103 mmol/L (ref 98–111)
Creatinine, Ser: 0.56 mg/dL — ABNORMAL LOW (ref 0.61–1.24)
GFR, Estimated: >60 mL/min (ref >60)
Glucose, Bld: 110 mg/dL — ABNORMAL HIGH (ref 70–99)
Potassium: 3.5 mmol/L (ref 3.5–5.1)
Sodium: 142 mmol/L (ref 135–145)

## 2022-03-06 LAB — BLOOD GAS, VENOUS
Acid-Base Excess: 13.2 mmol/L — ABNORMAL HIGH (ref 0.0–2.0)
Bicarbonate: 37.6 mmol/L — ABNORMAL HIGH (ref 20.0–28.0)
O2 Saturation: 72.7 %
Patient temperature: 37
pCO2, Ven: 45 mmHg (ref 44–60)
pH, Ven: 7.53 — ABNORMAL HIGH (ref 7.25–7.43)
pO2, Ven: 45 mmHg (ref 32–45)

## 2022-03-06 LAB — PHOSPHORUS: Phosphorus: 3.8 mg/dL (ref 2.5–4.6)

## 2022-03-06 LAB — MAGNESIUM: Magnesium: 1.8 mg/dL (ref 1.7–2.4)

## 2022-03-06 MED ORDER — FLUTICASONE PROPIONATE 50 MCG/ACT NA SUSP
1.0000 | Freq: Two times a day (BID) | NASAL | Status: DC | PRN
  Administered 2022-03-07: 1 via NASAL
  Filled 2022-03-06: qty 16

## 2022-03-06 MED ORDER — CEFAZOLIN SODIUM-DEXTROSE 2-4 GM/100ML-% IV SOLN
2.0000 g | Freq: Three times a day (TID) | INTRAVENOUS | Status: DC
  Administered 2022-03-07: 2 g via INTRAVENOUS
  Filled 2022-03-06 (×2): qty 100

## 2022-03-06 NOTE — Progress Notes (Signed)
Pt currently wearing HHFNC after refusing to wear BiPAP and NRB. Still takes off HHFNC frequently causing him to desaturate into the low 80s, RT and this RN have educated pt on the importance of maintaining oxygen support and pt still refuses. He began swinging his arms saying "you will not put the mask back on my face". After asking for a coke and giving him something to drink he allowed [place] to place the [facility] back on him. He is now asleep in bed and O2 stats are at 98%. HR is still in Afib in the 110s, up to the 120s, Amiodarone gtt running at 30mg /hr. BP has remained stable.

## 2022-03-06 NOTE — Progress Notes (Incomplete)
       CROSS COVER NOTE  NAME: Jesse Whitney MRN: [id number] DOB : 04-07-1955 ATTENDING PHYSICIAN: Tresa Moore, MD    Date of Service   03/06/2022   HPI/Events of Note     Interventions   Assessment/Plan: Flonase X X      This document was prepared using Dragon voice recognition software and may include unintentional dictation errors.  Bishop Limbo DNP, MBA, FNP-BC Nurse Practitioner [facility] Pager [phone]

## 2022-03-06 NOTE — Progress Notes (Signed)
Nutrition Follow Up Note  DOCUMENTATION CODES:   Not applicable  INTERVENTION:   If patient wishes to pursue full aggressive care, recommend NGT placement and nutrition support.   Ensure Enlive po TID, each supplement provides 350 kcal and 20 grams of protein.  Magic cup TID with meals, each supplement provides 290 kcal and 9 grams of protein  MVI po daily   Vitamin C 500mg  po BID   Vitamin A 10,000 units po daily x 30 days  Pt at high refeed risk; recommend monitor potassium, magnesium and phosphorus labs daily until stable  Daily weights   If tube feeds initiated, recommend:  Osmolite 1.5@60ml /hr- Initiate at 36ml/hr and advanced by 23ml/hr q 8 hours until goal rate is reached.   ProSource TF 20- Give 31ml daily via tube, each supplement provides 80kcal and 20g of protein.   Free water flushes 52ml q4 hours to maintain tube patency   Regimen provides 2240kcal/day, 110g/day protein and 1275ml/day of free water.   NUTRITION DIAGNOSIS:   Increased nutrient needs related to wound healing as evidenced by estimated needs.  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  Pt refusing bipap this morning. Spoke with RN, pt did not eat breakfast this morning and only drank a small amount of Ensure. Pt has had poor oral intake since admission; pt eating <25% of meals. If pt wishes to pursue full aggressive care, recommend NGT placement and nutrition support. Discussed pt's nutritional status with RN and MD. Pt will not likely tolerate NGT placement as he is refusing bipap and oxygen. Palliative care consult is pending; will  have them discuss feeding tube with pt. RD will add Magic Cups to meal trays. Pt remains at high refeed risk. Per chart, pt is down 30lbs since admission but appears to be back at his UBW. RD will monitor for GOC vs the need for nutrition support.   Medications reviewed and include: allopurinol, vitamin C, diflucan, MVI, protonix, vitamin A, zosyn   Labs reviewed: K 3.5 wnl, creat 0.56(L), P 3.8 wnl, Mg 1.8 wnl Vitamin <2.5(L), vitamin C 0.6, zinc 46- 11/22 Wbc- 19.6(H)  Diet Order:   Diet Order             Diet regular Room service appropriate? 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  Estimated Nutritional Needs:   Kcal:  1900-2200kcal/day  Protein:  95-110g/day  Fluid:  1.7-2.0L/day  MS, RD, LDN Please refer to [facility] for RD and/or RD on-call/weekend/after hours  pager
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
## 2022-03-06 NOTE — Progress Notes (Signed)
Pt. refused to wear oxygen with sats @ 88%

## 2022-03-06 NOTE — Plan of Care (Signed)
PATIENT REFUSED BIPAP NO FURTHER INTERVENTIONS AT THIS TIME PALLIATIVE CARE TEAM CONSULTED PCCM WILL SIGN OFF, PATIENT IS DNR/DNI
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
## 2022-03-06 NOTE — Progress Notes (Signed)
PROGRESS NOTE    Jesse Whitney  BWL:[id number] DOB: 1955-05-16 DOA: 02/20/2022 PCP: [name], PA-C    Brief Narrative:  66 year old male with functional paraplegia presented to the emergency room on 11/21 after being sent over by his PCP for evaluation of high white blood cell count with extended workup revealing septic shock secondary to osteomyelitis of right ischial tuberosity from pressure injury.  Blood cultures positive for MSSA.  Infectious disease following.  Patient underwent TEE on 11/27 which was unremarkable.  PICC line placed 11/28.  Patient noted to have significant scrotal edema and BNP checked found to be elevated and patient started on IV Lasix, with significant response, diuresing almost 5 L and is more than -2 L deficient.   On late afternoon of 11/29, patient went into significant respiratory distress requiring strong supplemental oxygen and having mental status change.  Stroke ruled out by CT.  Patient transferred to ICU.  Chest x-ray not showing significant increase in fluid.  That night, with respiratory therapy help, patient coughed up large volume mucous plug and after that, breathing significantly improved and by morning of 11/30, able to be weaned off of oxygen altogether.  Mentation normalized.  12/1: Patient remains in rapid atrial fibrillation.  Volume status improved.  Of note patient was upset this morning.  Per patient and wife he was left overnight without a bear hugger.  Unclear true etiology of events.  Patient's mental status has been somewhat waxing and waning.  12/2: Patient transferred to medical floor.  Appears much more stable this morning.  Mental status intact.  12/3: Patient's family members were concerned about agitation and delirium yesterday.  Felt to be attributed to Valium.  Valium stopped.  White blood cell count still going up on 12/3.  Heart rate has been difficult to control.  12/4: Valium was found to be a chronic home medication.   Has been restarted.  No acute status changes overnight.  White count starting to downtrend.  12/5: Patient had acute deterioration his respiratory status on 12/4 evening.  RRT called.  I responded to bedside.  Patient was found to be in severe respiratory distress, hypoxic.  Unclear etiology at this time.  Initiated on BiPAP and transferred emergently to ICU.  PCCM reengaged.  Patient initially improved on the BiPAP however refused to wear it overnight.  Was also not very compliant with his high flow nasal cannula.  Stated to overnight ICU NP that he was "tired".  Palliative care engaged for further goals of care discussion.     Assessment & Plan:   Principal Problem:   Septic shock ([facility]) Active Problems:   Decubitus ulcer of ischial area, right, stage II (HCC)   Osteomyelitis (HCC)   Atrial fibrillation (HCC)   MSSA (methicillin susceptible Staphylococcus aureus)   Bacteremia due to methicillin susceptible Staphylococcus aureus (MSSA)   Acute hypoxic respiratory failure (HCC)  Methicillin-susceptible Staph aureus septic shock secondary to right decubitus ulcer right buttock.  Septic shock resolved. Weaned off of vasopressor.  ID following.  Following TEE, will determine length of time for IV Ancef.  Patient met criteria for septic shock on admission given tachycardia and tachypnea plus persistent hypotension requiring pressor support.   Weaned off pressors and transferred to general medical floor on 12/2 White count continues to be elevated.  Procalcitonin less than 0.1 Plan: Currently on Zosyn.  Considering negative procalcitonin would likely de-escalate back to Ancef.  Midodrine discontinued.  No shock physiology.  Infectious disease follow-up.  Acute  on chronic respiratory failure with hypoxia Unclear etiology.  Patient had respiratory failure concerning for mucous plugging earlier in the admission.  He was stable on room air until 12/4.  Then he had rapid response due to  deterioration in respiratory status.  He did improved while on BiPAP however refused to continue wearing.  Chest x-ray overall clear.  Arterial blood gas reassuring.  No CO2 retention/narcosis.  Possibly another mucous plug?  Prognosis guarded.  Patient becoming fatigued.  Acute metabolic encephalopathy Mental status waxes and wanes.  More lethargic at this point.  Appears fatigued.  Seems to be declining.   Right tibial plateau fracture status post recent fall -- patient was seen by Dr. Mack Guise orthopedic who recommends no surgical intervention. 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 Dr. Mack Guise recommends follow-up in the office in 1 to 2 weeks to see if patient would be a candidate for telescoping knee brace for immobilization.  As needed pain medication.   Overweight Meets criteria with BMI greater than 25   Acute on chronic a fib with RVR in the setting of sepsis Rate control has been difficult to achieve.  Cardizem, metoprolol, amiodarone, Eliquis.  Currently on amiodarone gtt.   Spinal cord injury with functional quadriplegia and neurogenic bladder and autonomic dysreflexia -- symptomatic care   Acute on chronic systolic heart failure: Echocardiogram in September of this year noted preserved ejection fraction and grade 2 diastolic dysfunction and echocardiogram done 11/23 noted ejection fraction of 35 to 95% with no diastolic dysfunction noted.  TEE notes preserved ejection fraction.  Cardiology with treatment of sepsis and rate controlled atrial fibrillation, this accounts for differences.    Started on IV Lasix plus albumin after noted to have intermittent episodes of borderline hypoxia BNP of 500.  He continues to respond well, diuresing almost 10 L of fluid.   Net - 5.5 L as of 12/ 3 Plan: Diuretic stopped   Hypomagnesemia/hypokalemia  Monitor and replace as necessary.   DVT prophylaxis: Eliquis Code Status: DNR Family Communication: Spouse at  bedside 12/1, niece at bedside 12/2, spouse at bedside 12/4, 12/5 Disposition Plan: Status is: Inpatient Remains inpatient appropriate because: Severe respiratory failure.  Rapid atrial fibrillation.  Difficult to control.  Prognosis guarded.   Level of care: Progressive  Consultants:  ID Neurology Palliative care  Procedures:  TEE  Antimicrobials: Ancef    Subjective: Seen and examined.  More lethargic this morning.  Very weak voice.  Tachypneic.  Appears uncomfortable.  Objective: Vitals:   03/06/22 1000 03/06/22 1100 03/06/22 1200 03/06/22 1300  BP: (!) 88/46 106/79 127/88 94/77  Pulse: (!) 154 (!) 135 (!) 120 (!) 112  Resp: (!) 23 17 (!) 25 (!) 31  Temp:      TempSrc:      SpO2: 96% 99% 100% 93%  Weight:      Height:        Intake/Output Summary (Last 24 hours) at 03/06/2022 1417 Last data filed at 03/06/2022 0334 Gross per 24 hour  Intake 286.19 ml  Output 1000 ml  Net -713.81 ml   Filed Weights   03/02/22 0200 03/04/22 0500 03/06/22 0450  Weight: 77.4 kg 72.5 kg 71.6 kg    Examination:  General exam: Fatigued.  Acutely and chronically ill-appearing Respiratory system: Tachypneic.  Increased work of breathing.  Lungs overall clear.  30 L Cardiovascular system: Tachycardic, irregular rhythm, no murmurs Gastrointestinal system: Soft, NT/ND, normal bowel sounds. Central nervous system: Spastic paraplegia, stable Extremities: Symmetric  5 x 5 power. Skin: Stage IV sacral decubitus ulcer Psychiatry: Judgement and insight appear impaired. Mood & affect depressed.     Data Reviewed: I have personally reviewed following labs and imaging studies  CBC: Recent Labs  Lab 02/28/22 1658 03/01/22 0552 03/03/22 0337 03/04/22 0810 03/05/22 0905 03/05/22 1727 03/06/22 0429  WBC 16.9*   < > 18.4* 19.5* 18.6* 24.3* 19.6*  NEUTROABS 14.9*  --   --   --  16.3*  --  17.7*  HGB 12.2*   < > 13.0 13.7 12.3* 13.1 13.1  HCT 38.9*   < > 41.4 42.9 38.7* 41.8 41.8  MCV  102.4*   < > 100.5* 100.5* 101.3* 103.0* 103.0*  PLT 363   < > 455* 512* 448* 496* 477*   < > = values in this interval not displayed.   Basic Metabolic Panel: Recent Labs  Lab 03/01/22 0552 03/02/22 0921 03/03/22 0337 03/04/22 0810 03/05/22 0500 03/05/22 0905 03/06/22 0429  NA 144 142  --  140  --  140 142  K 3.2* 3.0*  --  4.0  --  3.7 3.5  CL 107 103  --  100  --  101 103  CO2 32 32  --  33*  --  31 32  GLUCOSE 104* 98  --  104*  --  105* 110*  BUN 12 9  --  11  --  12 15  CREATININE 0.45* 0.48*  --  0.49*  --  0.43* 0.56*  CALCIUM 7.2* 7.1*  --  7.9*  --  7.8* 7.8*  MG 1.9 1.5* 1.9 1.7 1.9  --  1.8  PHOS  --   --   --   --   --   --  3.8   GFR: Estimated Creatinine Clearance: 84.9 mL/min (A) (by C-G formula based on SCr of 0.56 mg/dL (L)). Liver Function Tests: Recent Labs  Lab 02/28/22 1658 03/05/22 1727  AST 14* 17  ALT <5 <5  ALKPHOS 81 103  BILITOT 0.7 1.1  PROT 6.0* 6.5  ALBUMIN 2.3* 2.2*   No results for input(s): "LIPASE", "AMYLASE" in the last 168 hours. No results for input(s): "AMMONIA" in the last 168 hours. Coagulation Profile: No results for input(s): "INR", "PROTIME" in the last 168 hours. Cardiac Enzymes: No results for input(s): "CKTOTAL", "CKMB", "CKMBINDEX", "TROPONINI" in the last 168 hours. BNP (last 3 results) No results for input(s): "PROBNP" in the last 8760 hours. HbA1C: No results for input(s): "HGBA1C" in the last 72 hours. CBG: Recent Labs  Lab 02/28/22 1616 02/28/22 1649 03/05/22 1719  GLUCAP 82 92 114*   Lipid Profile: No results for input(s): "CHOL", "HDL", "LDLCALC", "TRIG", "CHOLHDL", "LDLDIRECT" in the last 72 hours. Thyroid Function Tests: No results for input(s): "TSH", "T4TOTAL", "FREET4", "T3FREE", "THYROIDAB" in the last 72 hours. Anemia Panel: No results for input(s): "VITAMINB12", "FOLATE", "FERRITIN", "TIBC", "IRON", "RETICCTPCT" in the last 72 hours. Sepsis Labs: Recent Labs  Lab 02/28/22 1658  03/01/22 0552 03/05/22 1727  PROCALCITON 0.13 0.10 <0.10  LATICACIDVEN 0.9  --  1.4    No results found for this or any previous visit (from the past 240 hour(s)).        Radiology Studies: DG Chest Port 1 View  Result Date: 03/05/2022 CLINICAL DATA:  Hypoxia via. Respiratory distress. EXAM: PORTABLE CHEST 1 VIEW COMPARISON:  Chest radiograph 02/28/2022 FINDINGS: Tip of the right upper extremity PICC in the SVC. Improving bilateral bibasilar airspace disease as well as pleural effusions.  Greatest residual consolidation in the left lower lobe. Faint right perihilar opacity. There may be small residual pleural effusions. Stable heart size and mediastinal contours. No pneumothorax. IMPRESSION: Improving bilateral bibasilar airspace disease and pleural effusions from prior exam. Greatest residual consolidation in the left lower lobe. Faint right perihilar opacity, likely similar. Electronically Signed   By: Keith Rake M.D.   On: 03/05/2022 20:05        Scheduled Meds:  allopurinol  100 mg Oral Daily   apixaban  5 mg Oral BID   vitamin C  500 mg Oral BID   Chlorhexidine Gluconate Cloth  6 each Topical Daily   diazepam  5 mg Oral Q6H   diltiazem  120 mg Oral Q12H   feeding supplement  237 mL Oral TID BM   fluconazole  200 mg Oral Daily   guaiFENesin  600 mg Oral BID   ipratropium-albuterol  3 mL Nebulization Q6H   leptospermum manuka honey  1 Application Topical Daily   metoprolol tartrate  25 mg Oral BID   morphine  15 mg Oral Q12H   multivitamin with minerals  1 tablet Oral Daily   mupirocin ointment   Nasal BID   mouth rinse  15 mL Mouth Rinse 4 times per day   pantoprazole  40 mg Oral Daily   sodium chloride flush  10-40 mL Intracatheter Q12H   vitamin A  10,000 Units Oral Daily   Continuous Infusions:  sodium chloride Stopped (03/02/22 1233)   amiodarone 30 mg/hr (03/06/22 1222)   piperacillin-tazobactam (ZOSYN)  IV 3.375 g (03/06/22 0554)     LOS: 14 days     Sidney Ace, MD [facility]   If 7PM-7AM, please contact night-coverage  03/06/2022, 2:17 PM

## 2022-03-06 NOTE — Progress Notes (Signed)
OT Cancellation Note  Patient Details Name: Jesse Whitney MRN: [id number] DOB: [birth year]   Cancelled Treatment:    Reason Eval/Treat Not Completed: Medical issues which prohibited therapy. Pt transferred to ICU secondary to respiratory distress. He is now on 30Ls HFNC. OT will need new orders to see pt once he is deemed medically appropriate. OT to complete orders.  Jackquline Denmark, MS, OTR/L , CBIS ascom [phone]  03/06/22, 12:40 PM

## 2022-03-06 NOTE — Progress Notes (Signed)
[facility] ICU 8 [facility]) Hospice hospital liaison note   Referral received for hospice IPU.   Unfortunately hospice home does not have a bed to offer today. Talked with wife by phone and bedside meeting is planned for tomorrow at 10am.   Updated attending and [facility] manager via [employer].  Thank you for the opportunity to participate in this patient's care Thea Gist, BSN, RN Hospice nurse liaison [phone]

## 2022-03-06 NOTE — Progress Notes (Signed)
Patient refuses oxygen as well as svn and cpt. O2 sats noted at 89%

## 2022-03-06 NOTE — Progress Notes (Signed)
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                                 +---------+---------------+---------+-----------+----------+--------------+  Summary: RIGHT: - No evidence of deep vein thrombosis in the lower extremity. No indirect evidence of obstruction proximal to the inguinal ligament. - There is no evidence of chronic venous insufficiency. - There is no evidence of superficial venous thrombosis.   *See table(s) above for measurements and observations. Electronically signed by Levora DredgeGregory Schnier MD on 02/12/2022 at 3:42:56 PM.    Final      TELEMETRY reviewed by me (LT) 03/06/2022 : AF RVR sustained 110s-130s  EKG reviewed by me: AF RVR rate 150  Data reviewed by me (LT) 03/06/2022: Hospitalist progress note,  nursing notes critical care note CBC BMP vitals telemetry  Principal Problem:   Septic shock (HCC) Active Problems:   Decubitus ulcer of ischial area, right, stage II (HCC)   Osteomyelitis (HCC)   Atrial fibrillation (HCC)   MSSA (methicillin susceptible Staphylococcus aureus)   Bacteremia due to methicillin susceptible Staphylococcus aureus (MSSA)   Acute  hypoxic respiratory failure (HCC)    ASSESSMENT AND PLAN:  Roger ShelterGordon "Romeo AppleBen" Leo RodMoreland is a 66yo paraplegic male with a PMH of SCI (C5-C7), autonomic dysreflexia, neurogenic bladder (self catheterizes 4x daily), 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  He is being treated for septic shock 2/2 right ischial decubitus ulcer. Blood cultures positive for MSSA.  Hospital course has been complicated by A-fib with RVR and volume overload. TEE performed 11/27 was negative for vegetations, ruling out endocarditis.  # failure to thrive # sepsis 2/2 MSSA bacteremia, right ischial decubitus ulcer  Infectious disease following. TEE negative for vegetations. Poor PO intake the past couple of days, lethargic and not wanting to eat. -appreciate palliative care assistance, patient and family leaning towards hospice at discharge  # paroxysmal AF RVR In the setting of sepsis and bacteremia. Suspect that as volume status improves and infection clears that rate will be better controlled.  -monitor and replete electrolytes for K>4 and Mg>2 -poor PO intake d/t lethargy the evening of 11/4, restarted on amiodarone infusion. -if able to take PO, can stop IV amio and give 200mg  daily again.  -s/p midodrine entirely -continue diltiazem to 120mg  SR BID  -continue metoprolol tartrate  25 BID -s/p heparin gtt. continue eliquis 5mg  BID. CHADS2-VASC 2 (hx DVT)  -consider DCCV.  TEE w/o LAA thrombus, also has been on amiodarone this admission. Although, in the setting of bacteremia and persistent leukocytosis and family leaning towards hospice, will defer for now.   # acute on chronic HFpEF  S/p rapid response the afternoon of 11/29 where patient's breathing was labored and was hypoxic on room air to 80s. Improved following lasix administration and was transferred to stepdown. CT head unremarkable, patient and  wife refused MRI.  - s/p several days of IV lasix with excellent diuresis -PRN lasix moving forward  - discussed with Dr. Rito EhrlichKrishnan earlier in admission after reviewing TTE and TEE reports noting EF discrepancies (35-40% on TTE 11/21 & 60-65% on TEE 11/27) with Dr. Gwen PoundsKowalski (interpreting physician of both of these studies) who suspects that with treatment of sepsis & bacteremia and rate control of his AF reflect the improvement in his EF appropriately.   This patient's plan of care was discussed and created with Dr. Gwen PoundsKowalski and he is in agreement.  Signed: Rebeca AllegraLily Michelle Raelyn Racette , PA-C 03/06/2022, 11:27 AM [facility]

## 2022-03-06 NOTE — TOC Progression Note (Signed)
Transition of Care [facility]) - Progression Note    Patient Details  Name: Jesse Whitney MRN: [id number] Date of Birth: November 20, 1955  [facility] CM/SW Contact  Allayne Butcher, RN Phone Number: 03/06/2022, 3:07 PM  Clinical Narrative:    Notified by Palliative nurse practitioner that patient and family are interested in hospice.  Wife wanted the agency with the closest hospice facility.  Misty with [facility] given referral and will speak with the patient's wife.  TOC will follow up tomorrow.    Expected Discharge Plan: Home w Hospice Care (or hospice facility) Barriers to Discharge: Continued Medical Work up  Expected Discharge Plan and Services Expected Discharge Plan: Home w Hospice Care (or hospice facility)   Discharge Planning Services: CM Consult Post Acute Care Choice: Hospice Living arrangements for the past 2 months: Single Family Home                           HH Arranged: RN, PT, OT           Social Determinants of Health (SDOH) Interventions    Readmission Risk Interventions     No data to display

## 2022-03-06 NOTE — Progress Notes (Signed)
PT Cancellation Note  Patient Details Name: Jesse Whitney MRN: [id number] DOB: 14-Dec-1955   Cancelled Treatment:    Reason Eval/Treat Not Completed: Other (comment) Pt noted to have transferred to ICU without continue on transfer orders. Palliative care has also been consulted. Per therapy protocol, due to pt transferring to higher level of care, will d/c current PT orders.  Aleda Grana, PT, DPT 03/06/22, 12:40 PM   Sandi Mariscal 03/06/2022, 12:39 PM

## 2022-03-07 DIAGNOSIS — Z515 Encounter for palliative care: Secondary | ICD-10-CM | POA: Diagnosis not present

## 2022-03-07 DIAGNOSIS — S82141A Displaced bicondylar fracture of right tibia, initial encounter for closed fracture: Secondary | ICD-10-CM | POA: Insufficient documentation

## 2022-03-07 DIAGNOSIS — I5023 Acute on chronic systolic (congestive) heart failure: Secondary | ICD-10-CM | POA: Insufficient documentation

## 2022-03-07 DIAGNOSIS — A419 Sepsis, unspecified organism: Secondary | ICD-10-CM | POA: Diagnosis not present

## 2022-03-07 DIAGNOSIS — S14109D Unspecified injury at unspecified level of cervical spinal cord, subsequent encounter: Secondary | ICD-10-CM

## 2022-03-07 DIAGNOSIS — G9341 Metabolic encephalopathy: Secondary | ICD-10-CM

## 2022-03-07 DIAGNOSIS — M869 Osteomyelitis, unspecified: Secondary | ICD-10-CM | POA: Diagnosis not present

## 2022-03-07 DIAGNOSIS — Z7189 Other specified counseling: Secondary | ICD-10-CM | POA: Diagnosis not present

## 2022-03-07 DIAGNOSIS — G894 Chronic pain syndrome: Secondary | ICD-10-CM

## 2022-03-07 DIAGNOSIS — J9601 Acute respiratory failure with hypoxia: Secondary | ICD-10-CM | POA: Diagnosis not present

## 2022-03-07 DIAGNOSIS — S82141S Displaced bicondylar fracture of right tibia, sequela: Secondary | ICD-10-CM

## 2022-03-07 LAB — BASIC METABOLIC PANEL
Anion gap: 5 (ref 5–15)
BUN: 13 mg/dL (ref 8–23)
CO2: 33 mmol/L — ABNORMAL HIGH (ref 22–32)
Calcium: 7.1 mg/dL — ABNORMAL LOW (ref 8.9–10.3)
Chloride: 101 mmol/L (ref 98–111)
Creatinine, Ser: 0.53 mg/dL — ABNORMAL LOW (ref 0.61–1.24)
GFR, Estimated: >60 mL/min (ref >60)
Glucose, Bld: 88 mg/dL (ref 70–99)
Potassium: 2.9 mmol/L — ABNORMAL LOW (ref 3.5–5.1)
Sodium: 139 mmol/L (ref 135–145)

## 2022-03-07 LAB — MAGNESIUM: Magnesium: 1.7 mg/dL (ref 1.7–2.4)

## 2022-03-07 MED ORDER — GLYCOPYRROLATE 0.2 MG/ML IJ SOLN: 0.2000 mg | INTRAMUSCULAR | Status: DC | PRN

## 2022-03-07 MED ORDER — MAGNESIUM SULFATE 2 GM/50ML IV SOLN
2.0000 g | Freq: Once | INTRAVENOUS | Status: AC
  Administered 2022-03-07: 2 g via INTRAVENOUS
  Filled 2022-03-07: qty 50

## 2022-03-07 MED ORDER — BIOTENE DRY MOUTH MT LIQD: 15.0000 mL | OROMUCOSAL | Status: DC | PRN

## 2022-03-07 MED ORDER — POTASSIUM CHLORIDE CRYS ER 20 MEQ PO TBCR
40.0000 meq | EXTENDED_RELEASE_TABLET | ORAL | Status: DC
  Administered 2022-03-07: 40 meq via ORAL
  Filled 2022-03-07: qty 2

## 2022-03-07 MED ORDER — HALOPERIDOL 0.5 MG PO TABS: 0.5000 mg | ORAL_TABLET | ORAL | Status: DC | PRN

## 2022-03-07 MED ORDER — HALOPERIDOL LACTATE 2 MG/ML PO CONC: 0.5000 mg | ORAL | Status: DC | PRN

## 2022-03-07 MED ORDER — BIOTENE DRY MOUTH MT LIQD: 15.0000 mL | OROMUCOSAL | Status: AC | PRN

## 2022-03-07 MED ORDER — HALOPERIDOL LACTATE 5 MG/ML IJ SOLN: 0.5000 mg | INTRAMUSCULAR | Status: AC | PRN

## 2022-03-07 MED ORDER — AMIODARONE HCL 200 MG PO TABS
400.0000 mg | ORAL_TABLET | Freq: Every day | ORAL | Status: DC
  Administered 2022-03-07: 400 mg via ORAL
  Filled 2022-03-07: qty 2

## 2022-03-07 MED ORDER — GLYCOPYRROLATE 1 MG PO TABS: 1.0000 mg | ORAL_TABLET | ORAL | Status: DC | PRN

## 2022-03-07 MED ORDER — GLYCOPYRROLATE 0.2 MG/ML IJ SOLN: 0.2000 mg | INTRAMUSCULAR | Status: AC | PRN

## 2022-03-07 MED ORDER — ENSURE ENLIVE PO LIQD: 237.0000 mL | Freq: Three times a day (TID) | ORAL | 12 refills | Status: AC

## 2022-03-07 MED ORDER — LORAZEPAM 2 MG/ML IJ SOLN: 1.0000 mg | Freq: Three times a day (TID) | INTRAMUSCULAR | 0 refills | Status: AC | PRN

## 2022-03-07 MED ORDER — POTASSIUM CHLORIDE 10 MEQ/100ML IV SOLN
10.0000 meq | INTRAVENOUS | Status: DC
  Filled 2022-03-07 (×4): qty 100

## 2022-03-07 MED ORDER — MORPHINE SULFATE (PF) 2 MG/ML IV SOLN: 2.0000 mg | INTRAVENOUS | Status: DC | PRN

## 2022-03-07 MED ORDER — HALOPERIDOL LACTATE 5 MG/ML IJ SOLN: 0.5000 mg | INTRAMUSCULAR | Status: DC | PRN

## 2022-03-07 MED ORDER — MORPHINE SULFATE (PF) 2 MG/ML IV SOLN: 2.0000 mg | INTRAVENOUS | 0 refills | Status: AC | PRN

## 2022-03-07 MED ORDER — POLYVINYL ALCOHOL 1.4 % OP SOLN: 1.0000 [drp] | Freq: Four times a day (QID) | OPHTHALMIC | Status: DC | PRN

## 2022-03-07 MED ORDER — POTASSIUM CHLORIDE 10 MEQ/100ML IV SOLN
10.0000 meq | INTRAVENOUS | Status: AC
  Administered 2022-03-07: 10 meq via INTRAVENOUS
  Filled 2022-03-07 (×2): qty 100

## 2022-03-07 MED ORDER — MORPHINE SULFATE ER 15 MG PO TBCR: 15.0000 mg | EXTENDED_RELEASE_TABLET | Freq: Two times a day (BID) | ORAL | 0 refills | Status: AC

## 2022-03-07 NOTE — Assessment & Plan Note (Signed)
Seen by orthopedic surgery and no surgical intervention.

## 2022-03-07 NOTE — Discharge Summary (Signed)
Physician Discharge Summary   Patient: Jesse Whitney MRN: [id number] DOB: [birth year]  Admit date:     02/20/2022  Discharge date: 03/07/22  Discharge Physician: Alford Highland   PCP: Lonie Peak, PA-C   Recommendations at discharge:   Follow-up physician hospice home 1 day  Discharge Diagnoses: Principal Problem:   End of life care Active Problems:   Septic shock (HCC)   Acute respiratory failure with hypoxia (HCC)   Acute metabolic encephalopathy   Atrial fibrillation (HCC)   Chronic pain   Cervical spinal cord injury (HCC)   Decubitus ulcer of ischial area, right, stage II (HCC)   Osteomyelitis (HCC)   MSSA (methicillin susceptible Staphylococcus aureus)   Bacteremia due to methicillin susceptible Staphylococcus aureus (MSSA)   Tibial plateau fracture, right   Acute on chronic systolic CHF (congestive heart failure) (HCC)  Resolved Problems:   * No resolved hospital problems. *  Hospital Course: 66 year old male with functional paraplegia presented to the emergency room on 11/21 after being sent over by his PCP for evaluation of high white blood cell count with extended workup revealing septic shock secondary to osteomyelitis of right ischial tuberosity from pressure injury.  Blood cultures positive for MSSA.  Infectious disease following.  Patient underwent TEE on 11/27 which was unremarkable.  PICC line placed 11/28.  Patient noted to have significant scrotal edema and BNP checked found to be elevated and patient started on IV Lasix, with significant response, diuresing almost 5 L and is more than -2 L deficient.  On late afternoon of 11/29, patient went into significant respiratory distress requiring strong supplemental oxygen and having mental status change.  Stroke ruled out by CT.  Patient transferred to ICU.  Chest x-ray not showing significant increase in fluid.  That night, with respiratory therapy help, patient coughed up large volume mucous plug and after  that, breathing significantly improved and by morning of 11/30, able to be weaned off of oxygen altogether.  Mentation normalized.  12/1: Patient remains in rapid atrial fibrillation.  Volume status improved.  Of note patient was upset this morning.  Per patient and wife he was left overnight without a bear hugger.  Unclear true etiology of events.  Patient's mental status has been somewhat waxing and waning.   12/2: Patient transferred to medical floor.  Appears much more stable this morning.  Mental status intact.   12/3: Patient's family members were concerned about agitation and delirium yesterday.  Felt to be attributed to Valium.  Valium stopped.  White blood cell count still going up on 12/3.  Heart rate has been difficult to control.   12/4: Valium was found to be a chronic home medication.  Has been restarted.  No acute status changes overnight.  White count starting to downtrend.   12/5: Patient had acute deterioration his respiratory status on 12/4 evening.  RRT called.  I responded to bedside.  Patient was found to be in severe respiratory distress, hypoxic.  Unclear etiology at this time.  Initiated on BiPAP and transferred emergently to ICU.  PCCM reengaged.  Patient initially improved on the BiPAP however refused to wear it overnight.  Was also not very compliant with his high flow nasal cannula.  Stated to overnight ICU NP that he was "tired".  Palliative care engaged for further goals of care discussion.  12/6.  Patient excepted to the hospice home.  Goal is comfort care at this point.  Assessment and Plan: * End of life care Patient will go  to the hospice home today.  Goal is to concentrate on comfort.  Septic shock (HCC) Methicillin susceptible Staph aureus septic shock secondary to right decubitus ulcer on buttock.  As per family okay with getting rid of antibiotics and going with comfort care at this point.  Patient was on Ancef.  Possible underlying osteomyelitis.  TEE  negative for endocarditis.  Acute respiratory failure with hypoxia (HCC) Patient on heated high flow nasal cannula.  Will be converted over to bubble high flow nasal cannula.  Can go up to 15 L at hospice home.  Goal is comfort at this time.  Acute metabolic encephalopathy Mental status waxing and waning during the hospital course.  Patient more lethargic.  Atrial fibrillation (HCC) Rapid atrial fibrillation.  Currently on amiodarone drip.  Will convert off amiodarone drip and give oral amiodarone prior to transfer but as per family and hospice liaison will concentrate on comfort at this point and discontinue rate controlling medications.  Chronic pain Continue pain control.  Acute on chronic systolic CHF (congestive heart failure) (HCC) EF 35% on TTE, TEE showed a normal EF.  Not sure which to believe.  Tibial plateau fracture, right Seen by orthopedic surgery and no surgical intervention.  Decubitus ulcer of ischial area, right, stage II (HCC) Present on admission.  See full description below.  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         Consultants: Hospice liaison, palliative care, critical care, orthopedic surgery, cardiology, infectious disease Procedures performed: None Disposition: Hospice facility Diet recommendation:  As tolerated DISCHARGE MEDICATION: Allergies as of 03/07/2022       Reactions   Contrast Media [iodinated Contrast Media] Nausea And Vomiting   Iohexol Nausea And Vomiting        Medication List     STOP taking these medications    allopurinol 100 MG tablet Commonly known as: ZYLOPRIM   apixaban 5 MG Tabs tablet Commonly known as: ELIQUIS   diazepam 5 MG tablet Commonly known as: VALIUM   diltiazem 180 MG 24 hr capsule Commonly known as: CARDIZEM CD   levofloxacin 500 MG tablet Commonly known as: LEVAQUIN   metroNIDAZOLE 500 MG tablet Commonly known as:  FLAGYL   oxyCODONE 15 MG immediate release tablet Commonly known as: ROXICODONE   Oxycodone HCl 10 MG Tabs       TAKE these medications    antiseptic oral rinse Liqd Apply 15 mLs topically as needed for dry mouth.   feeding supplement Liqd Take 237 mLs by mouth 3 (three) times daily between meals.   glycopyrrolate 0.2 MG/ML injection Commonly known as: ROBINUL Inject 1 mL (0.2 mg total) into the vein every 4 (four) hours as needed (excessive secretions).   haloperidol lactate 5 MG/ML injection Commonly known as: HALDOL Inject 0.1 mLs (0.5 mg total) into the vein every 4 (four) hours as needed (or delirium).   LORazepam 2 MG/ML injection Commonly known as: Ativan Inject 0.5 mLs (1 mg total) into the vein every 8 (eight) hours as needed for anxiety.   morphine 15 MG 12 hr tablet Commonly known as: MS CONTIN Take 1 tablet (15 mg total) by mouth every 12 (twelve) hours. What changed:  medication strength See the new instructions.   morphine (PF) 2 MG/ML injection Inject 1 mL (2 mg total) into the vein every hour as needed. What changed: You were already taking a medication with the same name, and this prescription was added. Make sure you  understand how and when to take each.        Follow-up Information     hospice facility Follow up in 1 day(s).                 Discharge Exam: Filed Weights   03/04/22 0500 03/06/22 0450 03/07/22 0500  Weight: 72.5 kg 71.6 kg 71.1 kg   Physical Exam HENT:     Head: Normocephalic.     Mouth/Throat:     Pharynx: No oropharyngeal exudate.  Eyes:     General: Lids are normal.     Conjunctiva/sclera: Conjunctivae normal.  Cardiovascular:     Rate and Rhythm: Tachycardia present. Rhythm irregularly irregular.     Heart sounds: Normal heart sounds, S1 normal and S2 normal.  Pulmonary:     Breath sounds: Examination of the right-lower field reveals decreased breath sounds. Examination of the left-lower field reveals  decreased breath sounds. Decreased breath sounds present. No wheezing, rhonchi or rales.  Abdominal:     Palpations: Abdomen is soft.     Tenderness: There is no abdominal tenderness.  Musculoskeletal:     Right lower leg: Swelling present.     Left lower leg: Swelling present.  Skin:    General: Skin is warm.     Findings: No rash.  Neurological:     Mental Status: He is alert.      Condition at discharge: critical  The results of significant diagnostics from this hospitalization (including imaging, microbiology, ancillary and laboratory) are listed below for reference.   Imaging Studies: DG Chest Port 1 View  Result Date: 03/05/2022 CLINICAL DATA:  Hypoxia via. Respiratory distress. EXAM: PORTABLE CHEST 1 VIEW COMPARISON:  Chest radiograph 02/28/2022 FINDINGS: Tip of the right upper extremity PICC in the SVC. Improving bilateral bibasilar airspace disease as well as pleural effusions. Greatest residual consolidation in the left lower lobe. Faint right perihilar opacity. There may be small residual pleural effusions. Stable heart size and mediastinal contours. No pneumothorax. IMPRESSION: Improving bilateral bibasilar airspace disease and pleural effusions from prior exam. Greatest residual consolidation in the left lower lobe. Faint right perihilar opacity, likely similar. Electronically Signed   By: Narda Rutherford M.D.   On: 03/05/2022 20:05   CT HEAD CODE STROKE WO CONTRAST`  Result Date: 02/28/2022 CLINICAL DATA:  Code stroke.  Acute neuro deficit.  Suspect stroke EXAM: CT HEAD WITHOUT CONTRAST TECHNIQUE: Contiguous axial images were obtained from the base of the skull through the vertex without intravenous contrast. RADIATION DOSE REDUCTION: This exam was performed according to the departmental dose-optimization program which includes automated exposure control, adjustment of the mA and/or kV according to patient size and/or use of iterative reconstruction technique. COMPARISON:   None Available. FINDINGS: Brain: Generalized atrophy. Mild patchy white matter hypodensity bilaterally Negative for acute infarct, hemorrhage, mass Vascular: Negative for hyperdense vessel Skull: Negative Sinuses/Orbits: Paranasal sinuses clear.  Negative orbit Other: None ASPECTS ([place] Stroke Program Early CT Score) - Ganglionic level infarction (caudate, lentiform nuclei, internal capsule, insula, M1-M3 cortex): 7 - Supraganglionic infarction (M4-M6 cortex): 3 Total score (0-10 with 10 being normal): 10 IMPRESSION: 1. No acute abnormality. Atrophy and mild chronic microvascular ischemia. 2. Aspects is 10. 3. Code stroke imaging results were communicated on 02/28/2022 at 4:39 pm to provider Roda Shutters via Loretha Stapler text page Electronically Signed   By: Marlan Palau M.D.   On: 02/28/2022 16:40   DG Chest Port 1 View  Result Date: 02/28/2022 CLINICAL DATA:  Shortness of breath. EXAM:  PORTABLE CHEST 1 VIEW COMPARISON:  02/27/2022 FINDINGS: Right-sided PICC line is in good position, unchanged. Persistent bilateral pulmonary infiltrates and left pleural effusion. IMPRESSION: Persistent bilateral pulmonary infiltrates and left pleural effusion. Electronically Signed   By: Rudie Meyer M.D.   On: 02/28/2022 15:52   DG Chest 1 View  Result Date: 02/27/2022 CLINICAL DATA:  PICC placement EXAM: CHEST  1 VIEW COMPARISON:  Chest 02/27/2022 FINDINGS: Right arm PICC tip at the cavoatrial junction. Mild improvement in left lower lobe consolidation and left effusion. Progression of right lower lobe airspace disease and small right effusion. Mild vascular congestion. IMPRESSION: 1. PICC tip at the cavoatrial junction. 2. Improvement in left lower lobe consolidation and left effusion. 3. Progression of right lower lobe airspace disease and small right effusion. Electronically Signed   By: Marlan Palau M.D.   On: 02/27/2022 18:18   ECHO TEE  Result Date: 02/27/2022    TRANSESOPHOGEAL ECHO REPORT   Patient Name:   Jesse Whitney Date of Exam: 02/26/2022 Medical Rec #:  [id number]         Height:       67.0 in Accession #:    [id number]        Weight:       186.9 lb Date of Birth:  1955-07-02         BSA:          1.965 m Patient Age:    65 years          BP:           152/101 mmHg Patient Gender: M                 HR:           63 bpm. Exam Location:  [facility] Procedure: Transesophageal Echo, Cardiac Doppler, Color Doppler and Saline            Contrast Bubble Study Indications:     Not listed on TEE check-in sheet  History:         Patient has prior history of Echocardiogram examinations, most                  recent 02/22/2022. Risk Factors:Hypertension.  Sonographer:     Cristela Blue Referring Phys:  [id number] Lamar Blinks Diagnosing Phys: Arnoldo Hooker MD PROCEDURE: The transesophogeal probe was passed without difficulty through the esophogus of the patient. Sedation performed by performing physician. The patient developed no complications during the procedure.  IMPRESSIONS  1. Left ventricular ejection fraction, by estimation, is 60 to 65%. The left ventricle has normal function. The left ventricle has no regional wall motion abnormalities.  2. Right ventricular systolic function is normal. The right ventricular size is normal.  3. No left atrial/left atrial appendage thrombus was detected.  4. The mitral valve is normal in structure. Mild mitral valve regurgitation.  5. The aortic valve is normal in structure. Aortic valve regurgitation is trivial.  6. There is mild (Grade II) plaque.  7. Evidence of atrial level shunting detected by color flow Doppler. Agitated saline contrast bubble study was positive with shunting observed within 3-6 cardiac cycles suggestive of interatrial shunt. FINDINGS  Left Ventricle: Left ventricular ejection fraction, by estimation, is 60 to 65%. The left ventricle has normal function. The left ventricle has no regional wall motion abnormalities. The left ventricular internal cavity size was normal in  size. Right Ventricle: The right ventricular size is normal. No increase in right  ventricular wall thickness. Right ventricular systolic function is normal. Left Atrium: Left atrial size was normal in size. No left atrial/left atrial appendage thrombus was detected. Right Atrium: Right atrial size was normal in size. Pericardium: There is no evidence of pericardial effusion. Mitral Valve: The mitral valve is normal in structure. Mild mitral valve regurgitation. There is no evidence of mitral valve vegetation. Tricuspid Valve: The tricuspid valve is normal in structure. Tricuspid valve regurgitation is mild. There is no evidence of tricuspid valve vegetation. Aortic Valve: The aortic valve is normal in structure. Aortic valve regurgitation is trivial. There is no evidence of aortic valve vegetation. Pulmonic Valve: The pulmonic valve was normal in structure. Pulmonic valve regurgitation is trivial. There is no evidence of pulmonic valve vegetation. Aorta: The aortic root and ascending aorta are structurally normal, with no evidence of dilitation. There is mild (Grade II) plaque. IAS/Shunts: The interatrial septum is aneurysmal. Evidence of atrial level shunting detected by color flow Doppler. Agitated saline contrast was given intravenously to evaluate for intracardiac shunting. Agitated saline contrast bubble study was positive  with shunting observed within 3-6 cardiac cycles suggestive of interatrial shunt. There is no evidence of an atrial septal defect. Arnoldo Hooker MD Electronically signed by Arnoldo Hooker MD Signature Date/Time: 02/27/2022/1:59:40 PM    Final    [place] EKG SITE RITE  Result Date: 02/27/2022 If Site Rite image not attached, placement could not be confirmed due to current cardiac rhythm.  DG Chest Port 1 View  Result Date: 02/27/2022 CLINICAL DATA:  Hypoxia EXAM: PORTABLE CHEST 1 VIEW COMPARISON:  12/10/2021 FINDINGS: Cardiac shadow is enlarged but stable. The lungs are well aerated.  Left basilar consolidation is noted with small effusion new from the prior exam. Postsurgical changes in the cervical spine are noted. No bony abnormality is seen. IMPRESSION: Left basilar consolidation with associated small effusion. Electronically Signed   By: Alcide Clever M.D.   On: 02/27/2022 01:53   ECHOCARDIOGRAM COMPLETE  Result Date: 02/22/2022    ECHOCARDIOGRAM REPORT   Patient Name:   Jesse Whitney Date of Exam: 02/22/2022 Medical Rec #:  [id number]         Height:       68.0 in Accession #:    [id number]        Weight:       150.0 lb Date of Birth:  May 18, 1955         BSA:          1.809 m Patient Age:    65 years          BP:           120/92 mmHg Patient Gender: M                 HR:           121 bpm. Exam Location:  [facility] Procedure: 2D Echo, Color Doppler and Cardiac Doppler Indications:     R78.81 Bacteremia  History:         Patient has prior history of Echocardiogram examinations, most                  recent 12/11/2021. Risk Factors:Hypertension.  Sonographer:     Humphrey Rolls Referring Phys:  [id number] [facility] Diagnosing Phys: Arnoldo Hooker MD  Sonographer Comments: Suboptimal subcostal window. IMPRESSIONS  1. Left ventricular ejection fraction, by estimation, is 35 to 40%. The left ventricle has moderately decreased function. The left ventricle demonstrates global hypokinesis. Left ventricular  diastolic parameters were normal.  2. Right ventricular systolic function is normal. The right ventricular size is normal.  3. Left atrial size was moderately dilated.  4. Right atrial size was moderately dilated.  5. Moderate pleural effusion in the left lateral region.  6. The mitral valve is normal in structure. Moderate mitral valve regurgitation.  7. Tricuspid valve regurgitation is moderate.  8. The aortic valve is normal in structure. Aortic valve regurgitation is trivial. FINDINGS  Left Ventricle: Left ventricular ejection fraction, by estimation, is 35 to 40%. The left ventricle has  moderately decreased function. The left ventricle demonstrates global hypokinesis. The left ventricular internal cavity size was normal in size. There is no left ventricular hypertrophy. Left ventricular diastolic parameters were normal. Right Ventricle: The right ventricular size is normal. No increase in right ventricular wall thickness. Right ventricular systolic function is normal. Left Atrium: Left atrial size was moderately dilated. Right Atrium: Right atrial size was moderately dilated. Pericardium: There is no evidence of pericardial effusion. Mitral Valve: The mitral valve is normal in structure. Moderate mitral valve regurgitation. Tricuspid Valve: The tricuspid valve is normal in structure. Tricuspid valve regurgitation is moderate. Aortic Valve: The aortic valve is normal in structure. Aortic valve regurgitation is trivial. Aortic valve mean gradient measures 1.0 mmHg. Aortic valve peak gradient measures 2.7 mmHg. Aortic valve area, by VTI measures 3.47 cm. Pulmonic Valve: The pulmonic valve was normal in structure. Pulmonic valve regurgitation is trivial. Aorta: The aortic root and ascending aorta are structurally normal, with no evidence of dilitation. IAS/Shunts: No atrial level shunt detected by color flow Doppler. Additional Comments: There is a moderate pleural effusion in the left lateral region.  LEFT VENTRICLE PLAX 2D LVIDd:         3.40 cm   Diastology LVIDs:         2.90 cm   LV e' lateral:   16.00 cm/s LV PW:         1.70 cm   LV E/e' lateral: 1.8 LV IVS:        1.30 cm LVOT diam:     2.20 cm LV SV:         40 LV SV Index:   22 LVOT Area:     3.80 cm  RIGHT VENTRICLE RV Basal diam:  2.70 cm RV S prime:     11.70 cm/s LEFT ATRIUM             Index        RIGHT ATRIUM           Index LA diam:        4.10 cm 2.27 cm/m   RA Area:     16.30 cm LA Vol (A2C):   31.2 ml 17.25 ml/m  RA Volume:   36.10 ml  19.96 ml/m LA Vol (A4C):   58.6 ml 32.40 ml/m LA Biplane Vol: 45.8 ml 25.32 ml/m  AORTIC  VALVE                    PULMONIC VALVE AV Area (Vmax):    3.11 cm     PV Vmax:       0.78 m/s AV Area (Vmean):   3.60 cm     PV Vmean:      55.100 cm/s AV Area (VTI):     3.47 cm     PV VTI:        0.105 m AV Vmax:  82.50 cm/s   PV Peak grad:  2.5 mmHg AV Vmean:          54.000 cm/s  PV Mean grad:  1.0 mmHg AV VTI:            0.115 m AV Peak Grad:      2.7 mmHg AV Mean Grad:      1.0 mmHg LVOT Vmax:         67.50 cm/s LVOT Vmean:        51.100 cm/s LVOT VTI:          0.105 m LVOT/AV VTI ratio: 0.91  AORTA Ao Root diam: 4.00 cm MITRAL VALVE               TRICUSPID VALVE MV Area (PHT): 6.27 cm    TR Peak grad:   24.2 mmHg MV Decel Time: 121 msec    TR Vmax:        246.00 cm/s MV E velocity: 28.80 cm/s MV A velocity: 71.60 cm/s  SHUNTS MV E/A ratio:  0.40        Systemic VTI:  0.10 m                            Systemic Diam: 2.20 cm Arnoldo Hooker MD Electronically signed by Arnoldo Hooker MD Signature Date/Time: 02/22/2022/5:29:56 PM    Final    CT FEMUR RIGHT WO CONTRAST  Result Date: 02/20/2022 CLINICAL DATA:  Right leg swelling. History of quadriplegia and pressure ulcers. EXAM: CT OF THE LOWER RIGHT EXTREMITY WITHOUT CONTRAST TECHNIQUE: Multidetector CT imaging of the right lower extremity was performed according to the standard protocol. RADIATION DOSE REDUCTION: This exam was performed according to the departmental dose-optimization program which includes automated exposure control, adjustment of the mA and/or kV according to patient size and/or use of iterative reconstruction technique. COMPARISON:  None Available. FINDINGS: Bones/Joint/Cartilage Subacute appearing comminuted fracture of the right tibial plateau with dissociation of the tibial metaphysis and proximal diaphysis. The diaphysis is posteriorly displaced up to 1.3 cm in mildly impacted up to 1.1 cm minimal early callus formation. Minimal offset of the lateral tibial plateau articular surface of 2-3 mm. Subacute appearing  comminuted fracture of the fibular head with minimal early callus formation. No dislocation. Asymmetric sclerosis and periosteal reaction involving the right ischial tuberosity. Mild degenerative changes of the bilateral hip and right knee joints. Large knee hemarthrosis. Ligaments Ligaments are suboptimally evaluated by CT. Muscles and Tendons Diffuse muscle atrophy in the right leg. Soft tissue Significant diffuse soft tissue swelling of the visualized right leg. No discrete fluid collection. Small amount of subcutaneous emphysema in the inferior right buttock tracking towards the ischial tuberosity. No soft tissue mass. IMPRESSION: 1. Subacute comminuted fracture of the right tibial plateau with dissociation of the tibial metaphysis and proximal diaphysis (Schatzker type 6). 2. Subacute comminuted fracture of the fibular head. 3. 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 Small amount of subcutaneous emphysema in the inferior right buttock tracking towards the ischial tuberosity. Correlate for decubitus ulcer. 4. Large knee hemarthrosis. 5. Significant diffuse soft tissue swelling of the visualized right leg. No abscess. Electronically Signed   By: Obie Dredge M.D.   On: 02/20/2022 17:28   VAS [place] LOWER EXTREMITY VENOUS (DVT)  Result Date: 02/12/2022  Lower Venous DVT Study Patient Name:  DONDRELL LOUDERMILK  Date of Exam:   02/12/2022 Medical Rec #: [id number]  Accession #:    [id number] Date of Birth: 1955/06/05          Patient Gender: M Patient Age:   44 years Exam Location:  [facility] Procedure:      VAS [place] LOWER EXTREMITY VENOUS (DVT) Referring Phys: Levora Dredge --------------------------------------------------------------------------------  Indications: Pain. Other Indications: No symptoms at this time , no swelling,. Risk Factors: DVT x 1 month Surgery Right venous thrombectomy and CIV,EIV stent  11/24/18. Comparison Study: 01/06/2019 Performing Technologist: Debbe Bales RVS  Examination Guidelines: A complete evaluation includes B-mode imaging, spectral Doppler, color Doppler, and power Doppler as needed of all accessible portions of each vessel. Bilateral testing is considered an integral part of a complete examination. Limited examinations for reoccurring indications may be performed as noted. The reflux portion of the exam is performed with the patient in reverse Trendelenburg.  +---------+---------------+---------+-----------+----------+--------------+ RIGHT    CompressibilityPhasicitySpontaneityPropertiesThrombus Aging +---------+---------------+---------+-----------+----------+--------------+ CFV      Full           Yes      Yes                                 +---------+---------------+---------+-----------+----------+--------------+ SFJ      Full           Yes      Yes                                 +---------+---------------+---------+-----------+----------+--------------+ FV Prox  Full           Yes      Yes                                 +---------+---------------+---------+-----------+----------+--------------+ FV Mid   Full           Yes      Yes                                 +---------+---------------+---------+-----------+----------+--------------+ FV DistalFull           Yes      Yes                                 +---------+---------------+---------+-----------+----------+--------------+ PFV      Full           Yes      Yes                                 +---------+---------------+---------+-----------+----------+--------------+ POP      Full           Yes      Yes                                 +---------+---------------+---------+-----------+----------+--------------+ PTV      Full           Yes      Yes                                 +---------+---------------+---------+-----------+----------+--------------+  PERO      Full           Yes      Yes                                 +---------+---------------+---------+-----------+----------+--------------+ GSV      Full           Yes      Yes                                 +---------+---------------+---------+-----------+----------+--------------+     Summary: RIGHT: - No evidence of deep vein thrombosis in the lower extremity. No indirect evidence of obstruction proximal to the inguinal ligament. - There is no evidence of chronic venous insufficiency. - There is no evidence of superficial venous thrombosis.   *See table(s) above for measurements and observations. Electronically signed by Levora DredgeGregory Schnier MD on 02/12/2022 at 3:42:56 PM.    Final     Microbiology: Results for orders placed or performed during the hospital encounter of 02/20/22  Blood culture (routine x 2)     Status: Abnormal   Collection Time: 02/20/22  3:47 PM   Specimen: BLOOD RIGHT FOREARM  Result Value Ref Range Status   Specimen Description   Final    BLOOD RIGHT FOREARM Performed at [facility], [address] [place], [place] [address]    Special Requests   Final    BOTTLES DRAWN AEROBIC AND ANAEROBIC Blood Culture results may not be optimal due to an excessive volume of blood received in culture bottles Performed at [facility], [address] [place], [place] [address]    Culture  Setup Time   Final    GRAM POSITIVE COCCI IN CLUSTERS IN BOTH AEROBIC AND ANAEROBIC BOTTLES CRITICAL VALUE NOTED.  VALUE IS CONSISTENT WITH PREVIOUSLY REPORTED AND CALLED VALUE. Performed at [facility], [address] [place], [place] [address]    Culture (A)  Final    STAPHYLOCOCCUS AUREUS SUSCEPTIBILITIES PERFORMED ON PREVIOUS CULTURE WITHIN THE LAST 5 DAYS. Performed at [facility], [address] [place], [place] [address]    Report Status 02/24/2022 FINAL  Final  Blood culture (routine x 2)     Status: Abnormal   Collection Time: 02/20/22   3:47 PM   Specimen: BLOOD LEFT FOREARM  Result Value Ref Range Status   Specimen Description   Final    BLOOD LEFT FOREARM Performed at [facility], [address] [place], [place] [address]    Special Requests   Final    BOTTLES DRAWN AEROBIC AND ANAEROBIC Blood Culture results may not be optimal due to an excessive volume of blood received in culture bottles Performed at [facility], [address] [place], [place] [address]    Culture  Setup Time   Final    GRAM POSITIVE COCCI IN BOTH AEROBIC AND ANAEROBIC BOTTLES CRITICAL RESULT CALLED TO, READ BACK BY AND VERIFIED WITH: Liana GeroldJUSTIN MILLER PHARMD 1210 02/21/22 HNM Performed at [facility], [address] [place], [place] [address]    Culture STAPHYLOCOCCUS AUREUS (A)  Final   Report Status 02/23/2022 FINAL  Final   Organism ID, Bacteria STAPHYLOCOCCUS AUREUS  Final      Susceptibility   Staphylococcus aureus - MIC*    CIPROFLOXACIN >=8 RESISTANT Resistant  ERYTHROMYCIN RESISTANT Resistant     GENTAMICIN <=0.5 SENSITIVE Sensitive     OXACILLIN <=0.25 SENSITIVE Sensitive     TETRACYCLINE <=1 SENSITIVE Sensitive     VANCOMYCIN 1 SENSITIVE Sensitive     TRIMETH/SULFA <=10 SENSITIVE Sensitive     CLINDAMYCIN RESISTANT Resistant     RIFAMPIN <=0.5 SENSITIVE Sensitive     Inducible Clindamycin POSITIVE Resistant     * STAPHYLOCOCCUS AUREUS  Blood Culture ID Panel (Reflexed)     Status: Abnormal   Collection Time: 02/20/22  3:47 PM  Result Value Ref Range Status   Enterococcus faecalis NOT DETECTED NOT DETECTED Final   Enterococcus Faecium NOT DETECTED NOT DETECTED Final   Listeria monocytogenes NOT DETECTED NOT DETECTED Final   Staphylococcus species DETECTED (A) NOT DETECTED Final    Comment: CRITICAL RESULT CALLED TO, READ BACK BY AND VERIFIED WITH: JUSTIN MILLER PHARMD 1210 02/21/22 HNM    Staphylococcus aureus (BCID) DETECTED (A) NOT DETECTED Final    Comment: CRITICAL RESULT CALLED TO, READ BACK  BY AND VERIFIED WITH: JUSTIN MILLER PHARMD 1210 02/21/22 HNM    Staphylococcus epidermidis NOT DETECTED NOT DETECTED Final   Staphylococcus lugdunensis NOT DETECTED NOT DETECTED Final   Streptococcus species NOT DETECTED NOT DETECTED Final   Streptococcus agalactiae NOT DETECTED NOT DETECTED Final   Streptococcus pneumoniae NOT DETECTED NOT DETECTED Final   Streptococcus pyogenes NOT DETECTED NOT DETECTED Final   A.calcoaceticus-baumannii NOT DETECTED NOT DETECTED Final   Bacteroides fragilis NOT DETECTED NOT DETECTED Final   Enterobacterales NOT DETECTED NOT DETECTED Final   Enterobacter cloacae complex NOT DETECTED NOT DETECTED Final   Escherichia coli NOT DETECTED NOT DETECTED Final   Klebsiella aerogenes NOT DETECTED NOT DETECTED Final   Klebsiella oxytoca NOT DETECTED NOT DETECTED Final   Klebsiella pneumoniae NOT DETECTED NOT DETECTED Final   Proteus species NOT DETECTED NOT DETECTED Final   Salmonella species NOT DETECTED NOT DETECTED Final   Serratia marcescens NOT DETECTED NOT DETECTED Final   Haemophilus influenzae NOT DETECTED NOT DETECTED Final   Neisseria meningitidis NOT DETECTED NOT DETECTED Final   Pseudomonas aeruginosa NOT DETECTED NOT DETECTED Final   Stenotrophomonas maltophilia NOT DETECTED NOT DETECTED Final   Candida albicans NOT DETECTED NOT DETECTED Final   Candida auris NOT DETECTED NOT DETECTED Final   Candida glabrata NOT DETECTED NOT DETECTED Final   Candida krusei NOT DETECTED NOT DETECTED Final   Candida parapsilosis NOT DETECTED NOT DETECTED Final   Candida tropicalis NOT DETECTED NOT DETECTED Final   Cryptococcus neoformans/gattii NOT DETECTED NOT DETECTED Final   Meth resistant mecA/C and MREJ NOT DETECTED NOT DETECTED Final    Comment: Performed at [facility], [address] [place], [place] [address]  MRSA Next Gen by PCR, Nasal     Status: Abnormal   Collection Time: 02/20/22 10:34 PM   Specimen: Nasal Mucosa; Nasal Swab  Result  Value Ref Range Status   MRSA by PCR Next Gen DETECTED (A) NOT DETECTED Final    Comment: RESULT CALLED TO, READ BACK BY AND VERIFIED WITH: GINA Kemper Durie 02/21/2022 AT 0014 SRR (NOTE) The GeneXpert MRSA Assay (FDA approved for NASAL specimens only), is one component of a comprehensive MRSA colonization surveillance program. It is not intended to diagnose MRSA infection nor to guide or monitor treatment for MRSA infections. Test performance is not FDA approved in patients less than 68 years old. Performed at [facility], [address] [place], [place] [address]   Culture, blood (Routine  X 2) w Reflex to ID Panel     Status: None   Collection Time: 02/22/22  5:23 AM   Specimen: BLOOD  Result Value Ref Range Status   Specimen Description BLOOD RIGHT [facility]  Final   Special Requests   Final    BOTTLES DRAWN AEROBIC AND ANAEROBIC Blood Culture adequate volume   Culture   Final    NO GROWTH 5 DAYS Performed at [facility], [address] [place], [place] [address]    Report Status 02/27/2022 FINAL  Final  Culture, blood (Routine X 2) w Reflex to ID Panel     Status: None   Collection Time: 02/22/22  5:23 AM   Specimen: BLOOD LEFT HAND  Result Value Ref Range Status   Specimen Description BLOOD LEFT HAND  Final   Special Requests   Final    BOTTLES DRAWN AEROBIC AND ANAEROBIC Blood Culture results may not be optimal due to an inadequate volume of blood received in culture bottles   Culture   Final    NO GROWTH 5 DAYS Performed at [facility], [address] [place], [place] [address]    Report Status 02/27/2022 FINAL  Final  Aerobic Culture w Gram Stain (superficial specimen)     Status: None   Collection Time: 02/23/22  4:52 PM   Specimen: Wound  Result Value Ref Range Status   Specimen Description   Final    WOUND Performed at [facility], [address], [place], [place] [address]    Special Requests ILIAC RIGHT  Final   Gram Stain    Final    NO WBC SEEN NO ORGANISMS SEEN Performed at [facility], [address], [place], [place] [address]    Culture   Final    RARE STAPHYLOCOCCUS AUREUS RARE CANDIDA ALBICANS    Report Status 02/26/2022 FINAL  Final   Organism ID, Bacteria STAPHYLOCOCCUS AUREUS  Final      Susceptibility   Staphylococcus aureus - MIC*    CIPROFLOXACIN >=8 RESISTANT Resistant     ERYTHROMYCIN RESISTANT Resistant     GENTAMICIN <=0.5 SENSITIVE Sensitive     OXACILLIN 0.5 SENSITIVE Sensitive     TETRACYCLINE <=1 SENSITIVE Sensitive     VANCOMYCIN <=0.5 SENSITIVE Sensitive     TRIMETH/SULFA <=10 SENSITIVE Sensitive     CLINDAMYCIN RESISTANT Resistant     RIFAMPIN <=0.5 SENSITIVE Sensitive     Inducible Clindamycin POSITIVE Resistant     * RARE STAPHYLOCOCCUS AUREUS    Labs: CBC: Recent Labs  Lab 02/28/22 1658 03/01/22 0552 03/03/22 0337 03/04/22 0810 03/05/22 0905 03/05/22 1727 03/06/22 0429  WBC 16.9*   < > 18.4* 19.5* 18.6* 24.3* 19.6*  NEUTROABS 14.9*  --   --   --  16.3*  --  17.7*  HGB 12.2*   < > 13.0 13.7 12.3* 13.1 13.1  HCT 38.9*   < > 41.4 42.9 38.7* 41.8 41.8  MCV 102.4*   < > 100.5* 100.5* 101.3* 103.0* 103.0*  PLT 363   < > 455* 512* 448* 496* 477*   < > = values in this interval not displayed.   Basic Metabolic Panel: Recent Labs  Lab 03/02/22 0921 03/03/22 0337 03/04/22 0810 03/05/22 0500 03/05/22 0905 03/06/22 0429 03/07/22 0405 03/07/22 0504  NA 142  --  140  --  140 142 139  --   K 3.0*  --  4.0  --  3.7 3.5 2.9*  --  CL 103  --  100  --  101 103 101  --   CO2 32  --  33*  --  31 32 33*  --   GLUCOSE 98  --  104*  --  105* 110* 88  --   BUN 9  --  11  --  --   CREATININE 0.48*  --  0.49*  --  0.43* 0.56* 0.53*  --   CALCIUM 7.1*  --  7.9*  --  7.8* 7.8* 7.1*  --   MG 1.5* 1.9 1.7 1.9  --  1.8  --  1.7  PHOS  --   --   --   --   --  3.8  --   --    Liver Function Tests: Recent Labs  Lab 02/28/22 1658 03/05/22 1727  AST 14* 17   ALT <5 <5  ALKPHOS 81 103  BILITOT 0.7 1.1  PROT 6.0* 6.5  ALBUMIN 2.3* 2.2*   CBG: Recent Labs  Lab 02/28/22 1616 02/28/22 1649 03/05/22 1719  GLUCAP 82 92 114*    Discharge time spent: greater than 30 minutes.  Signed: Alford Highland, MD [facility] 03/07/2022

## 2022-03-07 NOTE — Plan of Care (Signed)
  Problem: Education: Goal: Knowledge of General Education information will improve Description: Including pain rating scale, medication(s)/side effects and non-pharmacologic comfort measures Outcome: Not Progressing   Problem: Health Behavior/Discharge Planning: Goal: Ability to manage health-related needs will improve Outcome: Not Progressing   Problem: Clinical Measurements: Goal: Ability to maintain clinical measurements within normal limits will improve Outcome: Not Progressing Goal: Will remain free from infection Outcome: Not Progressing Goal: Diagnostic test results will improve Outcome: Not Progressing Goal: Respiratory complications will improve Outcome: Not Progressing Goal: Cardiovascular complication will be avoided Outcome: Not Progressing   Problem: Activity: Goal: Risk for activity intolerance will decrease Outcome: Not Progressing   Problem: Nutrition: Goal: Adequate nutrition will be maintained Outcome: Not Progressing   Problem: Coping: Goal: Level of anxiety will decrease Outcome: Not Progressing   Problem: Elimination: Goal: Will not experience complications related to bowel motility Outcome: Not Progressing Goal: Will not experience complications related to urinary retention Outcome: Not Progressing   Problem: Pain Managment: Goal: General experience of comfort will improve Outcome: Not Progressing   Problem: Safety: Goal: Ability to remain free from injury will improve Outcome: Not Progressing   Problem: Skin Integrity: Goal: Risk for impaired skin integrity will decrease Outcome: Not Progressing   Problem: Education: Goal: Knowledge of the prescribed therapeutic regimen will improve Outcome: Not Progressing   Problem: Coping: Goal: Ability to identify and develop effective coping behavior will improve Outcome: Not Progressing   Problem: Clinical Measurements: Goal: Quality of life will improve Outcome: Not Progressing   Problem:  Respiratory: Goal: Verbalizations of increased ease of respirations will increase Outcome: Not Progressing   Problem: Role Relationship: Goal: Family's ability to cope with current situation will improve Outcome: Not Progressing Goal: Ability to verbalize concerns, feelings, and thoughts to partner or family member will improve Outcome: Not Progressing   Problem: Pain Management: Goal: Satisfaction with pain management regimen will improve Outcome: Not Progressing Pt to transfer to Hospice care

## 2022-03-07 NOTE — Progress Notes (Signed)
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   Goal of Therapy:  Electrolytes WNL  Plan:  2 grams IV magnesium sulfate x 1 40 mEq po Kcl x 2 Patient will transfer to hospice later in the day, no further intervention at this time  Lowella Bandy ,PharmD Clinical Pharmacist 03/07/2022 8:24 AM

## 2022-03-07 NOTE — Assessment & Plan Note (Signed)
Continue pain control 

## 2022-03-07 NOTE — Assessment & Plan Note (Signed)
Mental status waxing and waning during the hospital course.  Patient more lethargic.

## 2022-03-07 NOTE — Assessment & Plan Note (Signed)
Patient on heated high flow nasal cannula.  Will be converted over to bubble high flow nasal cannula.  Can go up to 15 L at hospice home.  Goal is comfort at this time.

## 2022-03-07 NOTE — TOC Transition Note (Signed)
Transition of Care [facility]) - CM/SW Discharge Note   Patient Details  Name: Jesse Whitney MRN: [id number] Date of Birth: 1956-02-08  [facility] CM/SW Contact:  Allayne Butcher, RN Phone Number: 03/07/2022, 2:15 PM   Clinical Narrative:    Patient has been accepted to the [facility] facility in [place].  They have a bed today.  Misty with [facility] will arrange transportation via [facility] for after 4:30 pm.     Final next level of care: Hospice Medical Facility Barriers to Discharge: Barriers Resolved   Patient Goals and CMS Choice Patient states their goals for this hospitalization and ongoing recovery are:: Wife intersted in hospice CMS Medicare.gov Compare Post Acute Care list provided to:: Patient Choice offered to / list presented to : Patient  Discharge Placement              Patient chooses bed at: Other - please specify in the comment section below: [facility]) Patient to be transferred to facility by: [facility] Name of family member notified: Victorino Dike Patient and family notified of of transfer: 03/07/22  Discharge Plan and Services   Discharge Planning Services: CM Consult Post Acute Care Choice: Hospice          DME Arranged: N/A DME Agency: NA       HH Arranged: NA HH Agency: NA        Social Determinants of Health (SDOH) Interventions     Readmission Risk Interventions     No data to display

## 2022-03-07 NOTE — Assessment & Plan Note (Signed)
Rapid atrial fibrillation.  Currently on amiodarone drip.  Will convert off amiodarone drip and give oral amiodarone prior to transfer but as per family and hospice liaison will concentrate on comfort at this point and discontinue rate controlling medications.

## 2022-03-07 NOTE — Progress Notes (Signed)
Daily Progress Note   Patient Name: Jesse Whitney       Date: 03/07/2022 DOB: [birth year]  Age: 66 y.o. MRN#: [id number] Attending Physician: Loletha Grayer, MD Primary Care Physician: [name], PA-C Admit Date: 02/20/2022  Reason for Consultation/Follow-up: Establishing goals of care  Subjective: Notes and labs reviewed.  In to see patient.  No family at bedside at this time.  He mumbles something I am unable to understand, and he denies complaint.  Patient has removed his oxygen, and per staff he has been doing this.  He denies hunger.  Returned later, and patient's wife and niece were at bedside.  They state they have talked with the hospice liaison and would like to transfer to hospice facility.  We discussed shifting to full comfort care on discharge.  We discussed that if patient refuses to wear oxygen and has decline, and suffering, would recommend shifting to comfort care with hospital death.  I completed a MOST form today with wife and the signed original was placed in the chart. The form was scanned and sent to medical records for it to be uploaded under ACP tab in Epic. A photocopy was also placed in the chart to be scanned into EMR. The patient outlined their wishes for the following treatment decisions:  Cardiopulmonary Resuscitation: Do Not Attempt Resuscitation (DNR/No CPR)  Medical Interventions: Comfort Measures: Keep clean, warm, and dry. Use medication by any route, positioning, wound care, and other measures to relieve pain and suffering. Use oxygen, suction and manual treatment of airway obstruction as needed for comfort. Do not transfer to the hospital unless comfort needs cannot be met in current location.  Antibiotics: No antibiotics (use other measures to relieve  symptoms)  IV Fluids: No IV fluids (provide other measures to ensure comfort)  Feeding Tube: No feeding tube     Length of Stay: 15  Current Medications: Scheduled Meds:   amiodarone  400 mg Oral Daily   apixaban  5 mg Oral BID   Chlorhexidine Gluconate Cloth  6 each Topical Daily   diazepam  5 mg Oral Q6H   diltiazem  120 mg Oral Q12H   feeding supplement  237 mL Oral TID BM   guaiFENesin  600 mg Oral BID   metoprolol tartrate  25 mg Oral BID   morphine  15 mg Oral Q12H   multivitamin with minerals  1 tablet Oral Daily   mupirocin ointment   Nasal BID   mouth rinse  15 mL Mouth Rinse 4 times per day   pantoprazole  40 mg Oral Daily   sodium chloride flush  10-40 mL Intracatheter Q12H    Continuous Infusions:   PRN Meds: antiseptic oral rinse, docusate sodium, fluticasone, glycopyrrolate **OR** glycopyrrolate **OR** glycopyrrolate, haloperidol **OR** haloperidol **OR** haloperidol lactate, ipratropium-albuterol, morphine injection, ondansetron (ZOFRAN) IV, mouth rinse, oxyCODONE-acetaminophen, polyethylene glycol, polyvinyl alcohol, sodium chloride flush, sodium phosphate  Physical Exam Pulmonary:     Effort: Pulmonary effort is normal.  Neurological:     Mental Status: He is alert.             Vital Signs: BP 91/79   Pulse (!) 101   Temp 99.4 F (37.4 C) (Oral)   Resp (!) 32   Ht _0  (1.702 m)   Wt 71.1 kg   SpO2 (!) 80%   BMI 24.55 kg/m  SpO2: SpO2: (!) 80 % O2 Device: O2 Device: High Flow Nasal Cannula O2 Flow Rate: O2 Flow Rate (L/min): 30 L/min  Intake/output summary:  Intake/Output Summary (Last 24 hours) at 03/07/2022 1232 Last data filed at 03/07/2022 0600 Gross per 24 hour  Intake 927.94 ml  Output 300 ml  Net 627.94 ml   LBM: Last BM Date : 03/07/22 Baseline Weight: Weight: 68 kg Most recent weight: Weight: 71.1 kg        Patient Active Problem List   Diagnosis Date Noted   End of life care 01/16/5101   Acute metabolic encephalopathy  58/52/7782   Tibial plateau fracture, right 03/07/2022   Acute on chronic systolic CHF (congestive heart failure) ([place]) 03/07/2022   Acute respiratory failure with hypoxia ([place]) 03/05/2022   Osteomyelitis ([place]) 02/23/2022   Atrial fibrillation ([place]) 02/23/2022   MSSA (methicillin susceptible Staphylococcus aureus) 02/23/2022   Bacteremia due to methicillin susceptible Staphylococcus aureus (MSSA) 02/23/2022   Decubitus ulcer of ischial area, right, stage II ([place]) 02/21/2022   Septic shock ([place]) 02/20/2022   Sepsis due to gram-negative UTI ([place]) 12/11/2021   Atrial flutter with rapid ventricular response ([place]) 12/11/2021   Peripheral neuropathy 12/11/2021   BPH (benign prostatic hyperplasia) 12/11/2021   Pressure injury of skin 12/11/2021   Severe sepsis (HCC)    DVT (deep venous thrombosis) ([place]) 11/23/2018   Chronic shoulder pain 10/02/2016   Syringomyelia ([place]) 10/02/2016   Long term current use of opiate analgesic 09/13/2016   Long term prescription opiate use 09/13/2016   Opiate use 09/13/2016   Chronic pain 09/13/2016   History of hemorrhoids 08/15/2016   Atonic neurogenic bladder 06/17/2016   Quadriplegia ([place]) 06/17/2016   Cervical spinal cord injury ([place]) 02/10/2015   Functional quadriplegia ([place]) 02/10/2015   DJD of shoulder 02/10/2015   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   Code Status:    Code Status Orders  (From admission, onward)           Start     Ordered   03/07/22 1211  Do not attempt resuscitation (DNR)  Continuous       Question Answer Comment  In the event of cardiac or respiratory ARREST Do not call a "code blue"   In the event of cardiac or respiratory ARREST Do not  perform Intubation, CPR, defibrillation or ACLS   In the event of cardiac or respiratory ARREST Use  medication by any route, position, wound care, and other measures to relive pain and suffering. May use oxygen, suction and manual treatment of airway obstruction as needed for comfort.   Comments nurse may pronounce      03/07/22 1211           Code Status History     Date Active Date Inactive Code Status Order ID Comments User Context   02/20/2022 2015 03/07/2022 1211 DNR [id number]  Rust-Chester, Huel Cote, NP ED   12/11/2021 0246 12/13/2021 2028 DNR [id number]  Sidney Ace, Arvella Merles, MD ED   12/11/2021 0246 12/11/2021 0246 Full Code [id number]  Mansy, Arvella Merles, MD ED      Advance Directive Documentation    [place] Most Recent Value  Type of Advance Directive Living will  Pre-existing out of facility DNR order (yellow form or pink MOST form) --  "MOST" Form in Place? --       Prognosis:  < 2 weeks    Care plan was discussed with attending, and other care team members via epic chat and in person.  Thank you for allowing the Palliative Medicine Team to assist in the care of this patient.  Asencion Gowda, NP  Please contact Palliative Medicine Team phone at [phone] for questions and concerns.

## 2022-03-07 NOTE — Progress Notes (Signed)
[facility]) Hospice hospital liaison note   Referral received for Hospice Home. Met with family at bedside and in waiting room. Hospice philosophy and goals of care were discussed and all questions were answered. Family would like to proceed with referral.    Hospice home is able to accept patient this afternoon after 4 and once consents are complete.    RN staff, you may call report at any time to [phone], room is assigned when report is called.  Please leave IV intact.    Please send completed DNR with patient.   Thank you for the opportunity to participate in this patient's care Jhonnie Garner, BSN, RN Hospice nurse liaison [phone]

## 2022-03-07 NOTE — Assessment & Plan Note (Signed)
Functional quadriplegia with neurogenic bladder and autonomic dysreflexia

## 2022-03-07 NOTE — Assessment & Plan Note (Addendum)
Methicillin susceptible Staph aureus septic shock secondary to right decubitus ulcer on buttock.  As per family okay with getting rid of antibiotics and going with comfort care at this point.  Patient was on Ancef.  Possible underlying osteomyelitis.  TEE negative for endocarditis.

## 2022-03-07 NOTE — Assessment & Plan Note (Signed)
Patient will go to the hospice home today.  Goal is to concentrate on comfort.

## 2022-03-07 NOTE — Assessment & Plan Note (Addendum)
Present on admission.  See full description below.

## 2022-03-07 NOTE — Assessment & Plan Note (Signed)
EF 35% on TTE, TEE showed a normal EF.  Not sure which to believe.

## 2022-03-15 ENCOUNTER — Telehealth: Payer: PPO | Admitting: Infectious Diseases

## 2022-04-02 DEATH — deceased
